# Patient Record
Sex: Male | Born: 1960 | Race: White | Hispanic: No
Health system: Midwestern US, Community
[De-identification: ages and names within clinical notes are randomized; demographics above are authoritative.]

## PROBLEM LIST (undated history)

## (undated) DIAGNOSIS — G43909 Migraine, unspecified, not intractable, without status migrainosus: Secondary | ICD-10-CM

## (undated) DIAGNOSIS — J189 Pneumonia, unspecified organism: Secondary | ICD-10-CM

## (undated) DIAGNOSIS — B0229 Other postherpetic nervous system involvement: Secondary | ICD-10-CM

## (undated) DIAGNOSIS — R42 Dizziness and giddiness: Secondary | ICD-10-CM

## (undated) DIAGNOSIS — I639 Cerebral infarction, unspecified: Secondary | ICD-10-CM

## (undated) DIAGNOSIS — K259 Gastric ulcer, unspecified as acute or chronic, without hemorrhage or perforation: Secondary | ICD-10-CM

## (undated) DIAGNOSIS — K219 Gastro-esophageal reflux disease without esophagitis: Secondary | ICD-10-CM

## (undated) DIAGNOSIS — F329 Major depressive disorder, single episode, unspecified: Secondary | ICD-10-CM

## (undated) DIAGNOSIS — F32A Depression, unspecified: Secondary | ICD-10-CM

## (undated) DIAGNOSIS — Z87442 Personal history of urinary calculi: Secondary | ICD-10-CM

## (undated) DIAGNOSIS — E039 Hypothyroidism, unspecified: Secondary | ICD-10-CM

## (undated) DIAGNOSIS — E538 Deficiency of other specified B group vitamins: Secondary | ICD-10-CM

## (undated) DIAGNOSIS — M1990 Unspecified osteoarthritis, unspecified site: Secondary | ICD-10-CM

## (undated) DIAGNOSIS — E7800 Pure hypercholesterolemia, unspecified: Secondary | ICD-10-CM

## (undated) HISTORY — PX: TONSILECTOMY/ADENOIDECTOMY WITH MYRINGOTOMY: SHX6125

## (undated) HISTORY — DX: Gastric ulcer, unspecified as acute or chronic, without hemorrhage or perforation: K25.9

## (undated) HISTORY — PX: COLON SURGERY: SHX602

## (undated) HISTORY — DX: Unspecified osteoarthritis, unspecified site: M19.90

## (undated) HISTORY — PX: BACK SURGERY: SHX140

## (undated) HISTORY — PX: TONSILLECTOMY: SUR1361

## (undated) HISTORY — PX: PROSTATE SURGERY: SHX751

## (undated) HISTORY — DX: Migraine, unspecified, not intractable, without status migrainosus: G43.909

## (undated) HISTORY — PX: KNEE SURGERY: SHX244

## (undated) HISTORY — DX: Cerebral infarction, unspecified: I63.9

## (undated) HISTORY — PX: HERNIA REPAIR: SHX51

## (undated) HISTORY — DX: Deficiency of other specified B group vitamins: E53.8

## (undated) HISTORY — DX: Pure hypercholesterolemia, unspecified: E78.00

## (undated) HISTORY — PX: SPINE SURGERY: SHX786

## (undated) HISTORY — DX: Other postherpetic nervous system involvement: B02.29

## (undated) HISTORY — DX: Dizziness and giddiness: R42

## (undated) HISTORY — DX: Gastro-esophageal reflux disease without esophagitis: K21.9

## (undated) HISTORY — PX: EYE SURGERY: SHX253

---
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
## 2000-09-27 HISTORY — PX: LUNG SURGERY: SHX703

## 2003-09-28 HISTORY — PX: CHOLECYSTECTOMY: SHX55

## 2004-03-27 HISTORY — PX: GASTRIC BYPASS: SHX52

## 2005-09-27 HISTORY — PX: OTHER SURGICAL HISTORY: SHX169

## 2008-09-27 HISTORY — PX: HEMORRHOID SURGERY: SHX153

## 2015-10-16 NOTE — Nursing Note (Signed)
Nursing Discharge Summary - Text       Nursing Discharge Summary Entered On:  10/16/2015 16:23 EST    Performed On:  10/16/2015 16:23 EST by Barrett, RN, Tresa Endo               DC Information   Discharge To, Anticipated :   Home with family support   Mode of Discharge :   Ambulatory   Transportation :   Private vehicle   Accompanied By :   Marlane Hatcher, RN, Tresa Endo - 10/16/2015 16:23 EST

## 2016-05-12 NOTE — Nursing Note (Signed)
Nursing Discharge Summary - Text       Nursing Discharge Summary Entered On:  05/12/2016 15:38 EDT    Performed On:  05/12/2016 15:36 EDT by Wynonia HazardSARBANIS, RN, Maceo ProLAUREN B               DC Information   Discharge To, Anticipated :   Home with family support   Mode of Discharge :   Ambulatory   Transportation :   Private vehicle   Accompanied By :   Darlina GuysSpouse   SARBANIS, RN, Maceo ProLAUREN B - 05/12/2016 15:36 EDT   Education   Responsible Learner(s) :   No Data Available     Barriers To Learning :   None evident   Teaching Method :   Explanation, Printed materials   Wynonia HazardSARBANIS, RMaceo Pro, LAUREN B - 05/12/2016 15:36 EDT   Post-Hospital Education Adult Grid   Importance of Follow-Up Visits :   Verbalizes understanding   When to [facility] Health Care Provider :   [facility]   Wynonia HazardSARBANIS, RN, Maceo ProLAUREN B - 05/12/2016 15:36 EDT   Medication Education Adult Grid   Med Dosage, Route, Scheduling :   IT sales professionalVerbalizes understanding   Safety, Medication :   Verbalizes understanding   Wynonia HazardSARBANIS, RN, Leotis ShamesLAUREN B - 05/12/2016 15:36 EDT

## 2016-05-29 NOTE — Nursing Note (Signed)
Basic Admission Information - Text       Basic Admission Information Adult Entered On:  05/29/2016 17:52 EDT    Performed On:  05/29/2016 17:52 EDT by Gemma PayorBarr,  Melissa               Height/Weight   Height/Length Measured :   168 cm(Converted to: 5 ft 6 in, 5.51 ft, 66.14 in)    Ideal Body Weight Calculated :   64.126 kg   Gemma PayorBarr,  Melissa - 05/29/2016 17:52 EDT   Safety   Environmental Safety in Place :   Adequate room lighting, Bed exit alarm, Bed in low position, Call device within reach, Fall risk magnet on door   Demonstrates Ability to Use Call Light :   Yes   Patient Identified :   Identification band   Gemma PayorBarr,  Melissa - 05/29/2016 17:52 EDT

## 2016-05-29 NOTE — Transfer Summaries (Signed)
Intrahospital Transfer - Text       Intrahospital Transfer Entered On:  05/29/2016 17:01 EDT    Performed On:  05/29/2016 16:45 EDT by Marrion Coy, RN, LARA J               Transfer   Transfer Mode :   Stretcher   Patient's Transfer Condition :   Stable   Accompanied By Staff :   Transporter   Marrion Coy, RN, LARA J - 05/29/2016 17:01 EDT

## 2016-05-29 NOTE — Nursing Note (Signed)
Adult Patient History Form-Text       Adult Patient History Entered On:  05/29/2016 17:51 EDT    Performed On:  05/29/2016 17:41 EDT by Gemma Payor               General Info   Preferred Name :   Jimmy Perkins   Admitted From :   ED   Mode of Arrival on Unit :   Stretcher   In Clinical Trial With Signed Consent for Related Condition :   No signed consent for clinical trial   Information Given By :   Self   Pregnancy Status :   N/A   Has the patient received chemotherapy or biotherapy within the last 48 hours? :   No   Is the patient currently (2-3 days) receiving radiation treatment? :   No   Gemma Payor - 05/29/2016 17:41 EDT   Problem History   (As Of: 05/29/2016 17:51:29 EDT)   Problems(Active)    Back pain (SNOMED CT  :[id number] )  Name of Problem:   Back pain ; Recorder:   PORTER, RN, EVA G; Confirmation:   Confirmed ; Classification:   Medical ; Code:   [id number] ; Contributor System:   PowerChart ; Last Updated:   10/16/2015 12:11 EST ; Life Cycle Date:   10/16/2015 ; Life Cycle Status:   Active ; Vocabulary:   SNOMED CT        DISH (diffuse idiopathic skeletal hyperostosis) (IMO  :409811 )  Name of Problem:   DISH (diffuse idiopathic skeletal hyperostosis) ; Recorder:   PORTER, RN, EVA G; Confirmation:   Confirmed ; Classification:   Medical ; Code:   [phone] ; Contributor System:   Dietitian ; Last Updated:   10/16/2015 12:12 EST ; Life Cycle Date:   10/16/2015 ; Life Cycle Status:   Active ; Vocabulary:   IMO        Kidney stones (IMO  :95621 )  Name of Problem:   Kidney stones ; Recorder:   PORTER, Charity fundraiser, EVA G; Confirmation:   Confirmed ; Classification:   Medical ; Code:   [phone] ; Contributor System:   Dietitian ; Last Updated:   10/16/2015 12:11 EST ; Life Cycle Date:   10/16/2015 ; Life Cycle Status:   Active ; Vocabulary:   IMO        Migraine (IMO  :78469 )  Name of Problem:   Migraine ; Recorder:   PORTER, RN, EVA G; Confirmation:   Confirmed ; Classification:   Medical ; Code:   [id number] ;  Contributor System:   Dietitian ; Last Updated:   10/16/2015 12:11 EST ; Life Cycle Date:   10/16/2015 ; Life Cycle Status:   Active ; Vocabulary:   IMO        Nerve damage (SNOMED CT  :[id number] )  Name of Problem:   Nerve damage ; Recorder:   Marliss Czar, RN, Bridgette Habermann; Confirmation:   Confirmed ; Classification:   Medical ; Code:   [id number] ; Contributor System:   Dietitian ; Last Updated:   05/12/2016 11:02 EDT ; Life Cycle Date:   05/12/2016 ; Life Cycle Status:   Active ; Vocabulary:   SNOMED CT        Stimulator, device (SNOMED CT  :[id number] )  Name of Problem:   Stimulator, device ; Recorder:   Gemma Payor; Confirmation:   Confirmed ; Classification:   Medical ; Code:   [phone] ; Contributor System:  PowerChart ; Last Updated:   05/29/2016 17:51 EDT ; Life Cycle Date:   05/29/2016 ; Life Cycle Status:   Active ; Vocabulary:   SNOMED CT          Diagnoses(Active)    Ataxic gait  Date:   05/29/2016 ; Diagnosis Type:   Discharge ; Confirmation:   Confirmed ; Clinical Dx:   Ataxic gait ; Classification:   Medical ; Clinical Service:   Non-Specified ; Code:   ICD-10-CM ; Probability:   0 ; Diagnosis Code:   R26.0      Dizziness  Date:   05/29/2016 ; Diagnosis Type:   Reason For Visit ; Confirmation:   Complaint of ; Clinical Dx:   Dizziness ; Classification:   Medical ; Clinical Service:   Emergency medicine ; Code:   PNED ; Probability:   0 ; Diagnosis Code:   [id number]      Dizziness  Date:   05/29/2016 ; Diagnosis Type:   Discharge ; Confirmation:   Confirmed ; Clinical Dx:   Dizziness ; Classification:   Medical ; Clinical Service:   Non-Specified ; Code:   ICD-10-CM ; Probability:   0 ; Diagnosis Code:   R42      Hypertension  Date:   05/29/2016 ; Diagnosis Type:   Discharge ; Confirmation:   Confirmed ; Clinical Dx:   Hypertension ; Classification:   Medical ; Clinical Service:   Non-Specified ; Code:   ICD-10-CM ; Probability:   0 ;  Diagnosis Code:   I10        Family History   Family History   (As Of: 05/29/2016 17:51:29 EDT)   Father:   Relation:   Father ; Gender:   Male ;           Nomenclature:   Diabetes mellitus ; Value:   Positive            Nomenclature:   Stroke ; Value:   Positive            Nomenclature:   Parkinson disease ; Value:   Positive          Mother:   Relation:   Mother ; Gender:   Male ;           Nomenclature:   Breast cancer ; Value:   Positive            Allergy   (As Of: 05/29/2016 17:51:29 EDT)   Allergies (Active)   OxyCONTIN  Estimated Onset Date:   Unspecified ; Created ByHale Bogus, RN, EVA G; Reaction Status:   Active ; Category:   Drug ; Substance:   OxyCONTIN ; Type:   Allergy ; Severity:   Mild ; Updated By:   Hale Bogus RN, EVA G; Reviewed Date:   05/29/2016 14:30 EDT      Reglan  Estimated Onset Date:   Unspecified ; Created By:   Hale Bogus, RN, EVA G; Reaction Status:   Active ; Category:   Drug ; Substance:   Reglan ; Type:   Allergy ; Severity:   Mild ; Updated By:   Hale Bogus RN, EVA G; Reviewed Date:   05/29/2016 14:30 EDT        Immunizations   Last Tetanus :   Unknown   Influenza Vaccine Status :   Non-influenza season (before Oct 1st and after Mar 31st)   Pneumococcal Vaccine Status :   Unknown   Gemma Payor - 05/29/2016 17:41 EDT  ID Risk Screen   Patient Recent Travel History :   No recent travel   Family Member Travel History :   No recent travel   Gemma Payor - 05/29/2016 17:41 EDT   Chills :   No   Cough (Any Duration) :   No   Fever :   No   Hemoptysis (Blood in Sputum) :   No   Night Sweats :   No   Weight Loss Greater Than 10 Pounds :   No   Hx of TB Now or at Any Time In the Past (Even if on Meds) :   No   Foreign-Born :   No   Homeless or In Shelter :   No   Incarcerated Within Last 2 Years :   No   Intravenous Drug User :   No   Male Homosexual :   No   New TST/IGRA Results Pos(Within 2 yrs),Hx Recent TB Exposure :   No   Gemma Payor - 05/29/2016 17:41 EDT   C. diff Screen   Have you had 3 or more  loose/watery stool in 24 hours? :   No   Gemma Payor - 05/29/2016 17:41 EDT   Procedure History        -    Procedure History   (As Of: 05/29/2016 17:51:29 EDT)     Anesthesia Minutes:   0 ; Procedure Name:   Stimulator ; Procedure Minutes:   0 ; Comments:     10/16/2015 12:13 - PORTER, RN, EVA G  back            Anesthesia Minutes:   0 ; Procedure Name:   Lung adhesion ; Procedure Minutes:   0 ; Comments:     10/16/2015 12:14 - PORTER, RN, EVA G  decortication of left lower lung            Anesthesia Minutes:   0 ; Procedure Name:   Cholecystectomy ; Procedure Minutes:   0            Anesthesia Minutes:   0 ; Procedure Name:   Colon ; Procedure Minutes:   0 ; Comments:     10/16/2015 12:13 - PORTER, RN, EVA G  part of colon removed            Anesthesia Minutes:   0 ; Procedure Name:   Back ; Procedure Minutes:   0 ; Comments:     10/16/2015 12:12 - PORTER, RN, EVA G  x2            Anesthesia Minutes:   0 ; Procedure Name:   Gastric ; Procedure Minutes:   0 ; Comments:     10/16/2015 12:12 - PORTER, RN, EVA G  bypass            Anesthesia/Sedation   Anesthesia History :   Prior general anesthesia   Anesthesia Reaction :   None   Moderate Sedation History :   Prior sedation for procedure   Previous Problem With Sedation :   None   Gemma Payor - 05/29/2016 17:41 EDT   Transfusion/Bloodless Med   Transfusion History :   No prior transfusion   Will Patient Accept Blood Transfusion and/or Blood Products :   Yes   Gemma Payor - 05/29/2016 17:41 EDT   Nutrition   Home Diet :   Regular   Appetite :   Fair   Feeding Ability :  Complete independence   Home Liquid Viscosity :   Thin   Usual Weight :   88.6 kg(Converted to: 195 lb 5 oz, 195.33 lb, 3,125.27 oz)    Unintentional Weight Change Greater Than 10 lbs in the Last 6 Months :   No   Gemma Payor - 05/29/2016 17:41 EDT   Functional   Cognitive Function Concerns Prior to Admission :   None reported   Fear of Falling :   No   Ability to Ambulate Prior to Admission :    Independent   Ability to Move in Bed Prior to Admission :   Independent   ADLs :   Minimal assist   Current Level of Assistance for Self-Care/Mobility :   No change from baseline   Usual Hours of Sleep :   8 hr   Gemma Payor - 05/29/2016 17:41 EDT   Functional Assessment   Bathing :   Independent (2)   Dressing :   Requires assistance (1)   Toileting :   Requires assistance (1)   Transferring Bed or Chair :   Requires assistance (1)   Continence :   Independent (2)   Feeding :   Independent (2)   ADL Index Score :   9    Gemma Payor - 05/29/2016 17:41 EDT   Living and Resources   Living Situation :   Home with family support   Gemma Payor - 05/29/2016 17:41 EDT   Social History   Social History   (As Of: 05/29/2016 17:51:29 EDT)   Tobacco:        Former smoker   (Last Updated: 05/29/2016 14:11:48 EDT by Delora Fuel, RN, STEPHEN)          Alcohol:        Denies   (Last Updated: 10/16/2015 12:14:41 EST by Hale Bogus, RN, EVA G)          Substance Abuse:        Denies   (Last Updated: 10/16/2015 12:14:43 EST by Hale Bogus, RN, EVA G)            Advance Directive   *Advance Directive :   No   Gemma Payor - 05/29/2016 17:41 EDT   Educ Needs   Patient/Family Learning Style Preferences   Patient :   Verbal explanation   Family :   Verbal explanation   Gemma Payor - 05/29/2016 17:41 EDT   Barriers to Learning :   None evident   Preventative Measures Information Given :   Special educational needs teacher, Environmental Safety, Pain Scale   Gemma Payor - 05/29/2016 17:41 EDT   DC Needs   Discharge To :   Home with family support   Gemma Payor - 05/29/2016 17:41 EDT

## 2016-05-29 NOTE — Nursing Note (Signed)
Orthostatics - Text       Orthostatics Entered On:  05/29/2016 14:04 EDT    Performed On:  05/29/2016 14:03 EDT by Delora Fuel, RN, STEPHEN               Orthostatics   Systolic/Diastolic  Supine BP :   94 mmHg   Systolic/Diastolic  Supine BP :   69 mmHg   Pulse Supine :   80 bpm   Systolic/Diastolic  Standing BP :   98 mmHg   Systolic/Diastolic  Standing BP :   55 mmHg (LOW)    Pulse Standing :   91 bpm   LOWDERMILK, RN, STEPHEN - 05/29/2016 14:03 EDT

## 2016-05-30 NOTE — Discharge Summary (Signed)
Inpatient Clinical Summary             [facility]  Post-Acute Care Transfer Instructions  PERSON INFORMATION   Name: Jimmy Perkins, Jimmy Perkins  MRN: [id number]    FIN#: [id number]   PHYSICIANS  Admitting Physician: Camillia Herter  Attending Physician: Camillia Herter   PCP: Pcp, None  Discharge Diagnosis:  Ataxic gait; Dizziness; Hypertension  Comment:       PATIENT EDUCATION INFORMATION  Instructions:             Effects of a Stroke on the Brain and Body  Medication Leaflets:               Follow-up:                          With: Address: When:   [facility]     [phone]    Comments:   Call for followup appointment in 1-2 weeks for outpatient cardiac monitoring       With: Address: When:   Chip Boer [address]  [place], [place]  [address]  [phone]) [phone]    Comments:   Call for followup appointment in 1 week                                 Type Location Start [facility]   [facility] 06/23/2016 11:15:00 06/23/2016 12:00:00 Confirmed   SL Home Sleep Test [facility] 07/01/2016 19:30:00 07/01/2016 20:30:00 Confirmed          MEDICATION LIST  Medication Reconciliation at Discharge:         New Medications  Printed Prescriptions  atorvastatin (atorvastatin 40 mg oral tablet) 2 Tabs Oral (given by mouth) Once a Day (at bedtime). Refills: 0.  Last Dose:____________________  Medications That Were Updated - Follow Current Instructions  Other Medications  Current aspirin (aspirin 81 mg oral tablet, chewable) 4 Tabs Chewed every day.  Last Dose:____________________    Current cyanocobalamin (cyanocobalamin 1000 mcg/mL injectable solution)   Last Dose:____________________    Medications that have not changed  Other Medications  albuterol (albuterol CFC free 90 mcg/inh inhalation aerosol) 2 Puffs Inhale (breathe in) every 4 hours as needed shortness of breath or wheezing. Refills: 0.  Last Dose:____________________  baclofen (baclofen 10 mg oral  tablet)   Last Dose:____________________  cycloSPORINE ophthalmic (Restasis 0.05% ophthalmic emulsion)   Last Dose:____________________  fluticasone nasal (fluticasone 50 mcg/inh nasal spray) Nasal (into the nose) every day.  Last Dose:____________________  montelukast (Singulair 4 mg oral granule) Oral (given by mouth) every day.  Last Dose:____________________  multivitamin with minerals (Celebrate Multivitamin) every day.  Last Dose:____________________  ondansetron (ondansetron 8 mg oral tablet)   Last Dose:____________________  promethazine (promethazine 25 mg oral tablet)   Last Dose:____________________  sertraline (sertraline 100 mg oral tablet)   Last Dose:____________________  solifenacin (VESIcare 5 mg oral tablet)   Last Dose:____________________  tapentadol (Nucynta 75 mg oral tablet)   Last Dose:____________________  traZODone (traZODone 50 mg oral tablet)   Last Dose:____________________  zolpidem (zolpidem 10 mg oral tablet) 1 Tabs Oral (given by mouth) Once a Day (at bedtime) as needed., " THIS MEDICATION IS ASSOCIATED WITH AN INCREASED RISK OF FALLS."  Last Dose:____________________  No Longer Take the Following Medications  SUMAtriptan (SUMAtriptan 100 mg oral tablet)   Stop Taking Reason:  Physician Request         Patient's Final Home Medication List Upon Discharge:          albuterol (albuterol CFC free 90 mcg/inh inhalation aerosol) 2 Puffs Inhale (breathe in) every 4 hours as needed shortness of breath or wheezing. Refills: 0.  aspirin (aspirin 81 mg oral tablet, chewable) 4 Tabs Chewed every day.  atorvastatin (atorvastatin 40 mg oral tablet) 2 Tabs Oral (given by mouth) Once a Day (at bedtime). Refills: 0.  baclofen (baclofen 10 mg oral tablet)   cyanocobalamin (cyanocobalamin 1000 mcg/mL injectable solution)   cycloSPORINE ophthalmic (Restasis 0.05% ophthalmic emulsion)   fluticasone nasal (fluticasone 50 mcg/inh nasal spray) Nasal (into the nose) every day.  montelukast (Singulair 4 mg  oral granule) Oral (given by mouth) every day.  multivitamin with minerals (Celebrate Multivitamin) every day.  ondansetron (ondansetron 8 mg oral tablet)   promethazine (promethazine 25 mg oral tablet)   sertraline (sertraline 100 mg oral tablet)   solifenacin (VESIcare 5 mg oral tablet)   tapentadol (Nucynta 75 mg oral tablet)   traZODone (traZODone 50 mg oral tablet)   zolpidem (zolpidem 10 mg oral tablet) 1 Tabs Oral (given by mouth) Once a Day (at bedtime) as needed., " THIS MEDICATION IS ASSOCIATED WITH AN INCREASED RISK OF FALLS."         Comment:       ORDERS         Order Name Order Details   Discharge Patient 05/30/16 16:02:00 EDT

## 2016-05-30 NOTE — Discharge Summary (Signed)
Discharge Summary    [facility]  Sorin Florea, MD  Admission Date: 05/29/2016  Discharged Date: 05/30/2016    PRIMARY CARE PHYSICIAN:  Worthy KeelerAlison MacLeod with [facility]     NEUROLOGY CONSULTATION:  Lilli FewLeonardo Morantes-Gomez, MD    DISCHARGE DISPOSITION:  Home.    CONDITION:  Fair.    ACTIVITY:  As tolerated.    DIET:  Cardiac.    DISCHARGE CONDITION:  Fair.    DISCHARGE DIAGNOSES:  1.  Vertigo, ataxia, right-sided motor deficit with possible  underlying ischemic cerebrovascular accident.  2.  Chronic low back pain with spinal nerve stimulator.  3.  Orthostatic hypotension, resolved.    DISCHARGE MEDICATIONS:  New  1.  Aspirin 325 mg p.o. daily.  2.  Atorvastatin 80 mg p.o. at bedtime.    MEDICATIONS:  Continue previous home   1.  Albuterol inhaler 2 puffs q.4 hours p.r.n. for shortness of  breath.  2.  Baclofen 10 mg p.o. daily.  3.  Vitamin B12 1000 mcg IM monthly.  4.  Restasis 0.05%, resume previous home dose.  5.  Flonase 1 puff per nostril daily.  6.  Singulair 10 mg p.o. daily.  7.  Multivitamin 1 p.o. daily.  8.  Zofran 8 mg p.o. q 8 hours p.r.n. for nausea.  9.  Phenergan 25 mg p.o. every 6 hours p.r.n. for nausea.  10.  Zoloft resume previous home dose.  11.  VESIcare 5 mg p.o. daily.  12.  Nucynta 75 mg p.o. daily.  13.  Trazodone 50 mg p.o. at bedtime.  14.  Ambien 10 mg p.o. at bedtime.    HOSPITAL COURSE:  Please review history and physical and consultation  [facility].    The patient is a 55 year old gentleman without history of diabetes,  hypertension or hypercholesterolemia who presented to the hospital  yesterday with complaints of dizziness.  He has history of chronic  back pain and has spinal nerve stimulator.  This was replaced about 3  years ago.  He developed vertigo with dizziness and his gait deviating  towards the right approximately 24 hours prior to admission.  The  symptoms started on the morning prior to admission when he got out of  bed and  lasted about 10 minutes initially.  Later on the symptoms  became persistent and never got away.  He went to urgent care  initially and then was instructed to go to the Emergency Room.  In the  Emergency Room, he was initially noted to be hypotensive with a blood  pressure of 88/67.  He was orthostatic with diastolic drop from 14  point from lying to standing.  He was given IV fluids and his blood  pressure recovered.  He had no speech problems and no vision or  hearing changes.  On physical examination, he had a right facial droop  and he also had weakness on the right upper and lower extremities.   His coordination on the right upper extremity was decreased as well.   He had a positive Romberg test with an unsteady gait.  His sensation  was normal and reflexes were also intact.    The patient was evaluated with a CT of the head without contrast that  showed no acute intracranial abnormalities, no hemorrhage, no mass  effect.  No extraaxial fluid collection.  There was no abnormal  parenchymal density and the ventricles were normal and the basilar  cisterns are patent.  There were  no signs of skull fracture.  The  sinuses appeared clear.  This was followed by a CT angiography of the  head and neck that did not show any signs of occlusion or stroke.    Clinically, the patient progressed well during his hospitalization.   His gait returned to normal.  He continues to have mild right upper  extremity weakness, although this is improved.  His coordination  returned to normal.  He still has a mild right facial droop but that  is improved also.  His speech remained clear.  He has no sensory  deficit.  He was seen in consultation by Dr. Reece PackerMorantes-Gomez from  neurology.  At this time, the patient's symptoms are strongly  suggestive of a resolved stroke.  The patient does not have a history  of hypertension, hyperlipidemia or diabetes which makes a small vessel  ischemic stroke unlikely.  The CT angiogram does not show any  signs of  a moderate clot/total occlusion and the patient's symptoms are not  typical for this.  This leaves the possibility of embolic stroke.   Unfortunately, the patient has an implanted baclofen stimulator an MRI  could not be done.  Dr. Reece PackerMorantes-Gomez recommended secondary stroke  prevention with aspirin and statin.    The patient was monitored on telemetry and no significant arrhythmias  were noted.  The patient reports that approximately 2 weeks ago, he  had some chest discomfort that woke him up at night.  He states that  lately he appears to fatigue more easily.  He may have paroxysmal  atrial fibrillation.  Again, no arrhythmias noted on the monitor  during this time.  The patient would benefit from outpatient  evaluation by cardiology with event monitor.  Echocardiogram was  performed and it showed normal left ventricular size and systolic  function.  There was normal right ventricular size and systolic  function.  There were no significant valvular abnormalities.  The  patient is recommended to follow up with his cardiologist as an  outpatient for a prolonged monitoring for detection of possible  underlying atrial fibrillation.    At this time, the patient appears clinically stable.  He was able to  get out of bed and ambulate without assistance.  His deficits are  improving significantly.  The vertigo, dizziness resolved.  He appears  clinically stable for discharge.  VITAL SIGNS:  His temperature is  37.2, heart rate 67, respirations 16, blood pressure 124/79, no  orthostatic changes of his blood pressure.  He is alert and oriented  times 3 in no distress.  LUNGS:  Clear to auscultation bilaterally.   HEART:  Regular rate, no murmur.  ABDOMEN:  Soft, nontender.   NEUROLOGIC:  Cranial nerves evaluation reveals a right facial droop,  otherwise the rest of the examination is within normal limits.   Sensory examination is normal throughout the body.  Motor examination  reveals mild decreased strength and  -5/5 on the right upper extremity.   Otherwise, his strength is normal in the rest of his extremities.   Finger-to-nose test is normal bilaterally.    FOLLOWUP:  With primary care physician Worthy KeelerAlison MacLeod in 1 week.  The  patient was recommended to follow up with the cardiologist as soon as  possible for further monitoring.      Sorin  Michael LitterFlorea, MD  TR: *n DD: 05/30/2016 17:10 TD: 05/30/2016 21:37 Job#: [id number]  \[id number]#: [id number]  \[id number]\      cc:  Lilli FewLeonardo Morantes-Gomez MD  Signature Line    Electronically Signed on 05/31/2016 03:09 PM EDT  ________________________________________________  Aldine Contes

## 2016-05-30 NOTE — Nursing Note (Signed)
Nursing Discharge Summary - Text       Physician Discharge Summary Entered On:  05/30/2016 16:02 EDT    Performed On:  05/30/2016 16:01 EDT by Aldine ContesFLOREA-MD,  SORIN               DC Information   Provider Instructions for Diet :   Cardiac diet   Provider Instructions for Activity :   Continue your regular activity   Aldine ContesFLOREA-MD,  SORIN - 05/30/2016 16:01 EDT

## 2016-05-30 NOTE — Discharge Summary (Signed)
Inpatient Patient Summary               [facility]  [address]  [place], [place] [address]  [phone]  Patient Discharge Instructions     Name: Jimmy Perkins, Jimmy Perkins  Current Date: 05/30/2016 16:36:17  DOB: [birth year] MRN: [id number] FIN: NBR%[phone]  Patient Address: [address] [place] [address]  Patient Phone: [phone]  Primary Care Provider:  Name: Pcp, None  Phone:    Immunizations Provided:       Discharge Diagnosis: Ataxic gait; Dizziness; Hypertension  Discharged To: TO, ANTICIPATED%>Home with family support  Home Treatments: TREATMENTS, ANTICIPATED%>  Devices/Equipment: EQUIPMENT REHAB%>Cane  Post Hospital Services: HOSPITAL SERVICES%>  Professional Skilled Services: SKILLED SERVICES%>  Therapist, sports and Community Resources:               SERV AND COMM RES, ANTICIPATED%>  Mode of Discharge Transportation: TRANSPORTATION%>  Discharge Orders         Discharge Patient 05/30/16 16:02:00 EDT         Comment:      Medications   During the course of your visit, your medication list was updated with the most current information. The details of those changes are reflected below:         New Medications  Printed Prescriptions  atorvastatin (atorvastatin 40 mg oral tablet) 2 Tabs Oral (given by mouth) Once a Day (at bedtime). Refills: 0.  Last Dose:____________________  Medications That Were Updated - Follow Current Instructions  Other Medications  Current aspirin (aspirin 81 mg oral tablet, chewable) 4 Tabs Chewed every day.  Last Dose:____________________    Current cyanocobalamin (cyanocobalamin 1000 mcg/mL injectable solution)   Last Dose:____________________    Medications that have not changed  Other Medications  albuterol (albuterol CFC free 90 mcg/inh inhalation aerosol) 2 Puffs Inhale (breathe in) every 4 hours as needed shortness of breath or wheezing. Refills: 0.  Last Dose:____________________  baclofen (baclofen 10 mg oral tablet)   Last  Dose:____________________  cycloSPORINE ophthalmic (Restasis 0.05% ophthalmic emulsion)   Last Dose:____________________  fluticasone nasal (fluticasone 50 mcg/inh nasal spray) Nasal (into the nose) every day.  Last Dose:____________________  montelukast (Singulair 4 mg oral granule) Oral (given by mouth) every day.  Last Dose:____________________  multivitamin with minerals (Celebrate Multivitamin) every day.  Last Dose:____________________  ondansetron (ondansetron 8 mg oral tablet)   Last Dose:____________________  promethazine (promethazine 25 mg oral tablet)   Last Dose:____________________  sertraline (sertraline 100 mg oral tablet)   Last Dose:____________________  solifenacin (VESIcare 5 mg oral tablet)   Last Dose:____________________  tapentadol (Nucynta 75 mg oral tablet)   Last Dose:____________________  traZODone (traZODone 50 mg oral tablet)   Last Dose:____________________  zolpidem (zolpidem 10 mg oral tablet) 1 Tabs Oral (given by mouth) Once a Day (at bedtime) as needed., " THIS MEDICATION IS ASSOCIATED WITH AN INCREASED RISK OF FALLS."  Last Dose:____________________  No Longer Take the Following Medications  SUMAtriptan (SUMAtriptan 100 mg oral tablet)   Stop Taking Reason: Physician Request         [facility] would like to thank you for allowing [place] to assist you with your healthcare needs. The following includes patient education materials and information regarding your injury/illness.     Jimmy, Hinton Perkins has been given the following list of follow-up instructions, prescriptions, and patient education materials:  Follow-up Instructions:             With: Address: When:   [facility]  [phone]    Comments:   Call for followup appointment in 1-2 weeks for outpatient cardiac monitoring       With: Address: When:   Chip BoerMACLEOD-FNP, ALLISON E [address]  [place], [place]  [id number]  [phone]    Comments:   Call for followup appointment in 1 week                     [facility]   [facility] 06/23/2016 11:15:00 06/23/2016 12:00:00 Confirmed   [facility] 07/01/2016 19:30:00 07/01/2016 20:30:00 Confirmed              It is important to always keep an active list of medications available so that you can share with other providers and manage your medications appropriately. As an additional courtesy, we are also providing you with your final active medications list that you can keep with you.           albuterol (albuterol CFC free 90 mcg/inh inhalation aerosol) 2 Puffs Inhale (breathe in) every 4 hours as needed shortness of breath or wheezing. Refills: 0.  aspirin (aspirin 81 mg oral tablet, chewable) 4 Tabs Chewed every day.  atorvastatin (atorvastatin 40 mg oral tablet) 2 Tabs Oral (given by mouth) Once a Day (at bedtime). Refills: 0.  baclofen (baclofen 10 mg oral tablet)   cyanocobalamin (cyanocobalamin 1000 mcg/mL injectable solution)   cycloSPORINE ophthalmic (Restasis 0.05% ophthalmic emulsion)   fluticasone nasal (fluticasone 50 mcg/inh nasal spray) Nasal (into the nose) every day.  montelukast (Singulair 4 mg oral granule) Oral (given by mouth) every day.  multivitamin with minerals (Celebrate Multivitamin) every day.  ondansetron (ondansetron 8 mg oral tablet)   promethazine (promethazine 25 mg oral tablet)   sertraline (sertraline 100 mg oral tablet)   solifenacin (VESIcare 5 mg oral tablet)   tapentadol (Nucynta 75 mg oral tablet)   traZODone (traZODone 50 mg oral tablet)   zolpidem (zolpidem 10 mg oral tablet) 1 Tabs Oral (given by mouth) Once a Day (at bedtime) as needed., " THIS MEDICATION IS ASSOCIATED WITH AN INCREASED RISK OF FALLS."      Take only the medications listed above. Contact your doctor prior to taking any medications not on this list.        Discharge instructions, if any, will display below     Instructions for Diet: INSTRUCTIONS FOR DIET%>Cardiac diet   Instructions for Supplements: SUPPLEMENT  INSTRUCTIONS%>   Instructions for Activity: INSTRUCTIONS FOR ACTIVITY%>Continue your regular activity   Instructions for Wound Care: INSTRUCTIONS FOR WOUND CARE%>     Medication leaflets, if any, will display below     Patient education materials, if any, will display below        Effects of a Stroke on the Brain and Body   When blood supply is cut off from the brain, cells begin to die from lack of oxygen. Within minutes, skills such as reasoning, speech, and some degree of arm, leg, or facial movement may be lost. The type of skills and the amount of loss depend on which part of the brain was affected, and how much tissue was damaged.      1.   The brain is the bodys control center, which handles communication, motor, sensory, and processing functions.   2.   The cerebellum controls the bodys coordination and balance.   3.   The brain stem links the brain and  the spinal cord. It also handles basic body functions.   4.   The spinal cord carries messages between the brain and the body.   A stroke causes lost skills   Each part of the brain has a role related to a function in the body. Damage to any part of the brain limits its ability to carry out its role. This results in lost skills or changes in function, and even in personality. Some parts of the brain and its correlating functions that may be affected by stroke are as follows:    The front of the brain houses reasoning and the ability to control emotions. Personality resides here.    The left side of the brain controls the right side of the body. It also handles speech, language, reading, and writing.    The right side of the brain controls the left side of the body.    The back of the brain controls vision.    The brain stem handles breathing and swallowing.      2000-2017 [employer] [address], [place], [place] [id number]. All rights reserved. This information is not intended as a substitute for professional medical care. Always follow  your healthcare professional's instructions.            IS IT A STROKE? Act FAST and Check for these signs:    FACE                         Does the face look uneven?    ARM                         Does one arm drift down?    SPEECH                    Does their speech sound strange?    TIME                         Call 9-1-1 at any sign of stroke  ---------------------------------------------------------------------------------------------------------------------------  Heart Attack Signs  Chest discomfort: Most heart attacks involve discomfort in the center of the chest and lasts more than a few minutes, or goes away and comes back. It can feel like uncomfortable pressure, squeezing, fullness or pain.  Discomfort in upper body: Symptoms can include pain or discomfort in one or both arms, back, neck, jaw or stomach.  Shortness of breath: With or without discomfort.  Other signs: Breaking out in a cold sweat, nausea, or lightheaded.  Remember, MINUTES DO MATTER. If you experience any of these heart attack warning signs, call 9-1-1 to get immediate medical attention!     ---------------------------------------------------------------------------------------------------------------------------  [facility] allows you to manage your health, view your test results, and retrieve your discharge documents from your hospital stay securely and conveniently from your computer.  To begin the enrollment process, visit [email or web address]. Click on "Sign up now" under [facility].   Yes - Patient/Family/Caregiver demonstrates understanding of instructions given        ______________________________                                 ___________________    Patient/Family/ Caregiver Signature  Date/Time     ______________________________                                 ___________________    Provider Signature                                                                                          Date/Time

## 2016-05-30 NOTE — Case Communication (Signed)
CM Discharge Planning Assessment - Text       CM Discharge Planning Assessment Entered On:  05/30/2016 12:57 EDT    Performed On:  05/30/2016 12:54 EDT by Gregary CromerLee,  Jennifer M               Home Environment   Living Environment :   No Living Environment Information Available     Affect/Behavior :   Appropriate   Lives With :   Child(ren), Spouse   Lives In :   Single level home   Gregary CromerLee,  Jennifer M - 05/30/2016 12:54 EDT   Rehabilitation Stairs Grid     Outside Stairs          Number of Stairs :    0                 Gregary CromerLee,  Jennifer M - 05/30/2016 12:54 EDT         Living Situation :   Home with family support   Job Responsibilities :   Disabled   Outside Facility Information :   [facility]: [facility], Macky LowerAllison Mecloud  Rx: [facility]   Home Health Provider :   N/A   Barriers at Home :   None   Gregary CromerLee,  Jennifer M - 05/30/2016 12:54 EDT   Home Environment   Home Equipment Rehab :   [employer]   ADLs :   Minimal assist   Gregary CromerLee,  Jennifer M - 05/30/2016 12:54 EDT   Discharge Needs I   Previously Documented Discharge Needs :   DISCHARGE PLAN/NEEDS:  Discharge To, Anticipated: Home with family support Gemma Payor- Barr,  Melissa - 05/29/16 17:41:00  EQUIPMENT/TREATMENT NEEDS:       Previously Documented Benefits Information :   No discharge data available.     Anticipated Discharge Date :   05/30/2016 EDT   Anticipated Discharge Time Slot :   1400-1600   Discharge To :   Home with family support   Home Caregiver Name/Relationship :   Rosie FateMary Tapp/spouse   Home Caregiver Phone Number :   [phone]   CM Progress Note :   05/30/16 jml: Pt is a 55 y/omale admitted w/vertigo and dizziness. He is on disability due to back pain issues. He and his family live together in [place]. He may go home today w/no CM needs.     Gregary CromerLee,  Jennifer M - 05/30/2016 12:54 EDT   Discharge Needs II   Professional Skilled Services :   No Needs   Needs Assistance with Transportation :   No   Needs Assistance at Home Upon Discharge :   No   Discharge Planning Time Spent :   20 minutes   Gregary CromerLee,   Jennifer M - 05/30/2016 12:54 EDT   Benefits   Insurance Information :   Managed Medicare   Gregary CromerLee,  Jennifer M - 05/30/2016 12:54 EDT   Advance Directive   *Advance Directive :   No   Patient Wishes to Receive Further Information on Advance Directives :   No   Gregary CromerLee,  Jennifer M - 05/30/2016 12:54 EDT

## 2016-06-11 NOTE — Discharge Summary (Signed)
Inpatient Patient Summary               [facility]  [address]  [place], [place] [id number]  [phone]  Patient Discharge Instructions    Name: Jimmy DessertRNOLD, Jimmy Perkins  Current Date: 06/11/2016 10:59:26  DOB: December 15, 1960 MRN: [id number] FIN: NBR%[phone]  Patient Address: [address] [place] [id number]  Patient Phone: [phone]  Primary Care Provider:  Name: Pcp, None  Phone:    Immunizations Provided:      Discharge Diagnosis: TIA (transient ischemic attack)  Discharged To: TO, ANTICIPATED%>Home with family support  Home Treatments: TREATMENTS, ANTICIPATED%>  Devices/Equipment: EQUIPMENT REHAB%>  Post Hospital Services: HOSPITAL SERVICES%>  Professional Skilled Services: SKILLED SERVICES%>  Therapist, sportspecial Services and Community Resources: SERV AND COMM RES, ANTICIPATED%>  Mode of Discharge Transportation: TRANSPORTATION%>  Discharge Orders         Discharge Patient 06/11/16 10:46:00 EDT        Comment:     Medications   During the course of your visit, your medication list was updated with the most current information. The details of those changes are reflected below:         Medications That Have Not Changed  Other Medications  albuterol (albuterol CFC free 90 mcg/inh inhalation aerosol) 1 Puffs Inhale (breathe in) every 4 hours as needed shortness of breath or wheezing.  Last Dose:____________________  aspirin (aspirin 81 mg oral tablet, chewable) 4 Tabs Chewed every day.  Last Dose:____________________  atorvastatin (atorvastatin 80 mg oral tablet) 1 Tabs Oral (given by mouth) every day.  Last Dose:____________________  baclofen (baclofen 10 mg oral tablet) 1 Tabs Oral (given by mouth) every day.  Last Dose:____________________  cyanocobalamin (cyanocobalamin 1000 mcg/mL injectable solution) 1,000 Microgram Intramuscular (in a muscle) once a month.  Last Dose:____________________  cycloSPORINE ophthalmic (Restasis 0.05% ophthalmic emulsion) 1 Drops Both eyes every 12 hours.  Last  Dose:____________________  fluticasone nasal (fluticasone 50 mcg/inh nasal spray) 2 Sprays Nasal (into the nose) every day.  Last Dose:____________________  montelukast (Singulair 4 mg oral granule) 1 Each Oral (given by mouth) every day.  Last Dose:____________________  multivitamin with minerals (Celebrate Multivitamin) 1 Tabs Oral (given by mouth) every day.  Last Dose:____________________  ondansetron (ondansetron 8 mg oral tablet) 1 Tabs Oral (given by mouth) every 6 hours as needed nausea/vomiting.  Last Dose:____________________  promethazine (promethazine 25 mg oral tablet) 1 Tabs Oral (given by mouth) every 6 hours as needed nausea.  Last Dose:____________________  sertraline (sertraline 100 mg oral tablet) 1 Tabs Oral (given by mouth) every day.  Last Dose:____________________  solifenacin (VESIcare 5 mg oral tablet) 1 Tabs Oral (given by mouth) every day.  Last Dose:____________________  tapentadol (Nucynta 75 mg oral tablet) 1 Tabs Oral (given by mouth) every 6 hours.  Last Dose:____________________  traZODone (traZODone 50 mg oral tablet) 1 Tabs Oral (given by mouth) Once a Day (at bedtime).  Last Dose:____________________  zolpidem (zolpidem 10 mg oral tablet) 1 Tabs Oral (given by mouth) Once a Day (at bedtime) as needed., " THIS MEDICATION IS ASSOCIATED WITH AN INCREASED RISK OF FALLS."  Last Dose:____________________        [facility] would like to thank you for allowing us to assist you with your healthcare needs. The following includes patient education materials and information regarding your injury/illness.    Jimmy Perkins has been given the following list of follow-up instructions, prescriptions, and patient education materials:  Follow-up Instructions:  With: Address: When:   [facility] [address]  [place], [place]  [address]  [phone] Within 1 month                    Type Location Start [facility]   Surgery [facility] Cath Lab 06/11/2016 11:00:00 06/11/2016  12:00:00 Confirmed   Surgery [facility] Endoscopy 06/23/2016 11:15:00 06/23/2016 12:00:00 Confirmed   SL Home Sleep Test [facility] Sleep Lab 07/01/2016 19:30:00 07/01/2016 20:30:00 Confirmed             It is important to always keep an active list of medications available so that you can share with other providers and manage your medications appropriately. As an additional courtesy, we are also providing you with your final active medications list that you can keep with you.          albuterol (albuterol CFC free 90 mcg/inh inhalation aerosol) 1 Puffs Inhale (breathe in) every 4 hours as needed shortness of breath or wheezing.  aspirin (aspirin 81 mg oral tablet, chewable) 4 Tabs Chewed every day.  atorvastatin (atorvastatin 80 mg oral tablet) 1 Tabs Oral (given by mouth) every day.  baclofen (baclofen 10 mg oral tablet) 1 Tabs Oral (given by mouth) every day.  cyanocobalamin (cyanocobalamin 1000 mcg/mL injectable solution) 1,000 Microgram Intramuscular (in a muscle) once a month.  cycloSPORINE ophthalmic (Restasis 0.05% ophthalmic emulsion) 1 Drops Both eyes every 12 hours.  fluticasone nasal (fluticasone 50 mcg/inh nasal spray) 2 Sprays Nasal (into the nose) every day.  montelukast (Singulair 4 mg oral granule) 1 Each Oral (given by mouth) every day.  multivitamin with minerals (Celebrate Multivitamin) 1 Tabs Oral (given by mouth) every day.  ondansetron (ondansetron 8 mg oral tablet) 1 Tabs Oral (given by mouth) every 6 hours as needed nausea/vomiting.  promethazine (promethazine 25 mg oral tablet) 1 Tabs Oral (given by mouth) every 6 hours as needed nausea.  sertraline (sertraline 100 mg oral tablet) 1 Tabs Oral (given by mouth) every day.  solifenacin (VESIcare 5 mg oral tablet) 1 Tabs Oral (given by mouth) every day.  tapentadol (Nucynta 75 mg oral tablet) 1 Tabs Oral (given by mouth) every 6 hours.  traZODone (traZODone 50 mg oral tablet) 1 Tabs Oral (given by mouth) Once a Day (at bedtime).  zolpidem (zolpidem 10 mg oral  tablet) 1 Tabs Oral (given by mouth) Once a Day (at bedtime) as needed., " THIS MEDICATION IS ASSOCIATED WITH AN INCREASED RISK OF FALLS."      Take only the medications listed above. Contact your doctor prior to taking any medications not on this list.      Discharge instructions, if any, will display below    Instructions for Diet: INSTRUCTIONS FOR DIET%>   Instructions for Supplements: SUPPLEMENT INSTRUCTIONS%>   Instructions for Activity: INSTRUCTIONS FOR ACTIVITY%>   Instructions for Wound Care: INSTRUCTIONS FOR WOUND CARE%>    Medication leaflets, if any, will display below     Patient education materials, if any, will display below        Transesophageal Echocardiography (TEE)        Transesophageal echocardiography (TEE) is a test done to record images of your heart with a probe inside your esophagus. These images help your healthcare provider find and treat problems such as infection, disease, or defects in your hearts function, walls or valves. This test may be done when a chest echocardiogram (transthoracic) does not give your provider enough information.   Before your test    Tell  your provider about all the medicines you take. Ask if its OK to take them before the test.    Dont eat or drink for 6 to 8 hours before the test. This includes water.    Tell your healthcare provider if you have ulcers, a hiatal hernia, or problems swallowing. Also report a history of narrowing of the esophagus, or any other previous gastrointestinal problems.  Also, let him or her know of any allergies to medicines or sedatives.    Also let your provider know if you have dental implants or dentures that should be removed before the test.    Arrange to have someone drive you home after the exam.   During your TEE    When you arrive for your TEE, you will change into a hospital gown, and then be taken to the testing room.    Your provider will spray your throat with a numbing medicine. You may be given a medicine through  an IV (intravenous) in your arm to help you relax. You may also be given oxygen. Then youll be asked to lie on your left side.    The healthcare provider gently inserts the small, lubricated probe into your mouth. As you swallow, he or she will slowly guide the tube into your esophagus.    You may feel the healthcare provider moving the probe, but it shouldnt hurt or interfere with your breathing. A nurse checks your heart rate, blood pressure, and breathing. The test usually takes 20 to 40 minutes.    The nurse or assistant will suction any saliva out of your mouth, similar to when you have a dental cleaning.   After the test    Tell your healthcare provider about any pain, or if you cough up or vomit blood, or have trouble swallowing.    You can eat and drink again when your throat is no longer numb.    Do not drive a car or run heavy machinery for at least 24 hours after getting sedation. After 24 hours you can return to normal activity unless your healthcare provider tells you otherwise.    Be sure to keep your follow-up appointment to go over the results with your healthcare provider.    Your next appointment is: ____________________      [id number] [employer] [address], [place], [place] [address]. All rights reserved. This information is not intended as a substitute for professional medical care. Always follow your healthcare professional's instructions.         Procedural Sedation   Procedural sedation is medicine to ease discomfort, pain, and anxiety during a procedure. The medicine is often given through an intravenous (IV) line in your arm or hand. In some cases, the medicine may be taken by mouth or inhaled. While you are under sedation, you will likely be awake. But you may not remember anything afterward.   Why procedural sedation is used   Sedation is used for many types of procedures. The goal is to reduce pain, anxiety, and stressful memories of a procedure. It can also  help your health care provider treat you. For example, having a broken bone fixed may be easier if you feel relaxed.   Procedural sedation is used only for short, basic procedures. It is not used for complex surgeries. Some procedures that use this type of sedation include:    Dental surgery    Breast biopsy, to take a sample of breast tissue    Endoscopy,  to look at gastrointestinal problems    Bronchoscopy, to check for lung problems    Bone or joint realignment, to fix a broken bone or dislocated joint    Minor foot or skin surgery    Electrical cardioversion, to restore a normal heart rhythm    Lumbar puncture, to assess neurological disease   Risks of procedural sedation   Procedural sedation has some risks and possible side effects, such as:    Headache    Nausea and vomiting    Unpleasant memory of the procedure    Lowered rate of breathing    Changes in heart rate and blood pressure (rare)    Inhalation of stomach contents into your lungs (rare)   Side effects will likely go away shortly after the procedure. Your health care team will watch your heart rate and breathing during your sedation. This is to help prevent problems.   Your own risks may vary based on your age and your overall health. They also depend on the type of sedation you are given. Talk with your health care provider about the risks that apply most to you.   Getting ready for procedural sedation   Talk with your health care provider how to get ready for your procedure. Tell him or her about all the medicines you take. This includes over-the-counter medicines such as ibuprofen. It also includes vitamins, herbs, and other supplements. You may need to stop taking some medicines before the procedure, such as blood thinners and aspirin. If you smoke, you may need to stop. Talk with your health care provider if you need help to stop smoking.   Tell your health care provider if you:    Have had any problems in the past with sedation  or anesthesia    Have had any recent changes in your health, such as an infection or fever    Are pregnant or think you may be pregnant   Also, make sure to:    Ask a family member or friend to take you home after the procedure. You cannot drive on the day you receive sedation.    Not eat or drink after midnight the night before your procedure, if advised.    Follow all other instructions from your health care provider.   During your procedural sedation   You may have your procedure in a hospital or a medical clinic. Sedation is done by a trained health care provider. In general, you can expect the following:    You will be given medicine through an IV line in your arm or hand. Or you may receive a shot. The medicine may also be given by mouth. Or you may inhale it through a mask.    If you receive medicine through an IV, you may feel the effects very quickly. You will start to feel relaxed and drowsy.    During the procedure, your heart rate, breathing, and blood pressure will be closely watched. Your breathing and blood pressure may decrease a little. But you will likely not need help with your breathing. You may receive a little extra oxygen through a mask.    You will probably be awake the entire time. If you do fall asleep, you should be easy to wake up, if needed. You should feel little or no pain.    When your procedure is over, the sedative medicine will be stopped.   After your procedural sedation   You will begin to feel more awake and aware.  But you will likely be drowsy for a while afterward. You will be closely watched as you become more alert. You may have a faint memory of the procedure. Or you may not remember it at all.   You should be able to return home within an hour or two after your procedure. Plan to have someone stay with you for a few hours. Side effects such as headache and nausea may go away quickly. Tell your health care provider if they continue.   Dont drive or make any  important decisions for at least 24 hours. Be sure to follow all after-care instructions.       When to call your health care provider   Have someone call your health care provider right away if you have any of these:    Drowsiness that gets worse    Weakness or dizziness that gets worse    Repeated vomiting    You cant be awakened      2000-2017 [employer] [address] [place], [place] [address]. All rights reserved. This information is not intended as a substitute for professional medical care. Always follow your healthcare professional's instructions.           IS IT A STROKE? Act FAST and Check for these signs:    FACE                         Does the face look uneven?    ARM                         Does one arm drift down?    SPEECH                    Does their speech sound strange?    TIME                         Call 9-1-1 at any sign of stroke  Heart Attack Signs  Chest discomfort: Most heart attacks involve discomfort in the center of the chest and lasts more than a few minutes, or goes away and comes back. It can feel like uncomfortable pressure, squeezing, fullness or pain.  Discomfort in upper body: Symptoms can include pain or discomfort in one or both arms, back, neck, jaw or stomach.  Shortness of breath: With or without discomfort.  Other signs: Breaking out in a cold sweat, nausea, or lightheaded.  Remember, MINUTES DO MATTER. If you experience any of these heart attack warning signs, call 9-1-1 to get immediate medical attention!     ---------------------------------------------------------------------------------------------------------------------  [facility] allows you to manage your health, view your test results, and retrieve your discharge documents from your hospital stay securely and conveniently from your computer.  To begin the enrollment process, visit [email or web address] Click on "Sign up now" under [facility].   Yes - Patient/Family/Caregiver  demonstrates understanding of instructions given        ______________________________                                 ___________________    Patient/Family/ Caregiver Signature  Date/Time     ______________________________                                 ___________________    Provider Signature                                                                                        Date/Time

## 2016-06-11 NOTE — Nursing Note (Signed)
Adult Patient History Form-Text       Adult Patient History Entered On:  06/11/2016 9:35 EDT    Performed On:  06/11/2016 9:32 EDT by Freida Busman, RN, Kayren Eaves               General Info   Preferred Name :   Jimmy Perkins   Admitted From :   home   Mode of Arrival on Unit :   Ambulatory   In Charge of News (ICON) Name :   leevi cullars (wife) [phone]   In Clinical Trial With Signed Consent for Related Condition :   No signed consent for clinical trial   Information Given By :   Self   Primary Language :   English   Preferred Mode of Communication :   Verbal, Written   Pregnancy Status :   N/A   Has the patient received chemotherapy or biotherapy within the last 48 hours? :   No   Is the patient currently (2-3 days) receiving radiation treatment? :   No   ALLEN, RN, KYRIE L - 06/11/2016 9:32 EDT   Problem History   (As Of: 06/11/2016 09:35:13 EDT)   Problems(Active)    Back pain (SNOMED CT  :[id number] )  Name of Problem:   Back pain ; Recorder:   PORTER, RN, EVA G; Confirmation:   Confirmed ; Classification:   Medical ; Code:   [id number] ; Contributor System:   PowerChart ; Last Updated:   10/16/2015 12:11 EST ; Life Cycle Date:   10/16/2015 ; Life Cycle Status:   Active ; Vocabulary:   SNOMED CT        DISH (diffuse idiopathic skeletal hyperostosis) (IMO  :[id number] )  Name of Problem:   DISH (diffuse idiopathic skeletal hyperostosis) ; Recorder:   PORTER, RN, EVA G; Confirmation:   Confirmed ; Classification:   Medical ; Code:   [id number] ; Contributor System:   Dietitian ; Last Updated:   10/16/2015 12:12 EST ; Life Cycle Date:   10/16/2015 ; Life Cycle Status:   Active ; Vocabulary:   IMO        Kidney stones (IMO  :[id number] )  Name of Problem:   Kidney stones ; Recorder:   PORTER, Charity fundraiser, EVA G; Confirmation:   Confirmed ; Classification:   Medical ; Code:   [id number] ; Contributor System:   Dietitian ; Last Updated:   10/16/2015 12:11 EST ; Life Cycle Date:   10/16/2015 ; Life Cycle Status:   Active ; Vocabulary:   IMO         Migraine (IMO  :29528 )  Name of Problem:   Migraine ; Recorder:   PORTER, RN, EVA G; Confirmation:   Confirmed ; Classification:   Medical ; Code:   [id number] ; Contributor System:   Dietitian ; Last Updated:   10/16/2015 12:11 EST ; Life Cycle Date:   10/16/2015 ; Life Cycle Status:   Active ; Vocabulary:   IMO        Nerve damage (SNOMED CT  :[id number] )  Name of Problem:   Nerve damage ; Recorder:   Marliss Czar, RN, Bridgette Habermann; Confirmation:   Confirmed ; Classification:   Medical ; Code:   [id number] ; Contributor System:   Dietitian ; Last Updated:   05/12/2016 11:02 EDT ; Life Cycle Date:   05/12/2016 ; Life Cycle Status:   Active ; Vocabulary:   SNOMED CT        Stimulator, device (SNOMED CT  :  [id number] )  Name of Problem:   Stimulator, device ; Recorder:   Gemma PayorBarr,  Melissa; Confirmation:   Confirmed ; Classification:   Medical ; Code:   [id number] ; Contributor System:   DietitianowerChart ; Last Updated:   05/29/2016 17:51 EDT ; Life Cycle Date:   05/29/2016 ; Life Cycle Status:   Active ; Vocabulary:   SNOMED CT        TIA (SNOMED CT  :[id number] )  Name of Problem:   TIA ; Recorder:   ALLEN, RN, KYRIE L; Confirmation:   Confirmed ; Classification:   Patient Stated ; Code:   [id number] ; Contributor System:   PowerChart ; Last Updated:   06/11/2016 9:33 EDT ; Life Cycle Date:   06/11/2016 ; Life Cycle Status:   Active ; Vocabulary:   SNOMED CT          Family History   Family History   (As Of: 06/11/2016 09:35:13 EDT)   Father:   Relation:   Father ; Gender:   Male ;           Nomenclature:   Diabetes mellitus ; Value:   Positive            Nomenclature:   Stroke ; Value:   Positive            Nomenclature:   Parkinson disease ; Value:   Positive          Mother:   Relation:   Mother ; Gender:   Male ;           Nomenclature:   Breast cancer ; Value:   Positive            Allergy   (As Of: 06/11/2016 09:35:13 EDT)   Allergies (Active)   OxyCONTIN  Estimated Onset Date:   Unspecified  ; Created ByHale Bogus:   PORTER, RN, EVA G; Reaction Status:   Active ; Category:   Drug ; Substance:   OxyCONTIN ; Type:   Allergy ; Severity:   Mild ; Updated By:   Hale BogusPORTER, RN, EVA G; Reviewed Date:   05/29/2016 14:30 EDT      Reglan  Estimated Onset Date:   Unspecified ; Created By:   Hale BogusPORTER, RN, EVA G; Reaction Status:   Active ; Category:   Drug ; Substance:   Reglan ; Type:   Allergy ; Severity:   Mild ; Updated By:   Hale BogusPORTER, RN, EVA G; Reviewed Date:   05/29/2016 14:30 EDT        Immunizations   Immunizations Current :   Unknown   Last Tetanus :   Greater than 5 years   Influenza Vaccine Status :   Not received   Influenza Vaccine Refused :   Yes   Pneumococcal Vaccine Status :   Received prior to admission   Pneumococcal Vaccine Refused :   Yes   ALLEN, RN, KYRIE L - 06/11/2016 9:32 EDT   ID Risk Screen   Patient Recent Travel History :   No recent travel   Family Member Travel History :   No recent travel   [place]EN, RN, [place] L - 06/11/2016 9:32 EDT   Infectious Disease Risk Factor Grid   Chills :   No   Fever :   No   Fatigue :   No   Headache :   No   Runny or Stuffy Nose :   No   Sore Throat :   No  Difficulty Breathing :   No   Shortness of Breath :   No   New or Worsening Cough :   No   Wheezing :   No   Vomiting :   No   Diarrhea :   No   Abdominal (Stomach Pain) :   No   Muscle Pain :   No   Weakness/Numbness :   No   Recent Exposure to Communicable Disease :   No   Illness With Generalized Rash :   No   Abnormal Bleeding :   No   Unexplained Hemorrhage (Bleeding or Bruising) :   No   Arthralgia :   No   Conjunctivitis :   No   ALLEN, RN, KYRIE L - 06/11/2016 9:32 EDT   MRSA/VRE Screening :   No   ALLEN, RN, KYRIE L - 06/11/2016 9:32 EDT   Chills :   No   Cough (Any Duration) :   No   Fever :   No   Hemoptysis (Blood in Sputum) :   No   Night Sweats :   No   Weight Loss Greater Than 10 Pounds :   No   Hx of TB Now or at Any Time In the Past (Even if on Meds) :   No   Foreign-Born :   No   Homeless or In Shelter :   No    Incarcerated Within Last 2 Years :   No   Intravenous Drug User :   No   Male Homosexual :   No   New TST/IGRA Results Pos(Within 2 yrs),Hx Recent TB Exposure :   No   ALLEN, RN, KYRIE L - 06/11/2016 9:32 EDT   Does the Patient Have any of the Following Conditions That Compromise the Immune System :   None   ALLEN, RN, KYRIE L - 06/11/2016 9:32 EDT   C. diff Screen   Have you had 3 or more loose/watery stool in 24 hours? :   No   ALLEN, RN, KYRIE L - 06/11/2016 9:32 EDT   Procedure History        -    Procedure History   (As Of: 06/11/2016 09:35:13 EDT)     Anesthesia Minutes:   0 ; Procedure Name:   Stimulator ; Procedure Minutes:   0 ; Comments:     10/16/2015 12:13 - PORTER, RN, EVA G  back            Anesthesia Minutes:   0 ; Procedure Name:   Lung adhesion ; Procedure Minutes:   0 ; Comments:     10/16/2015 12:14 - PORTER, RN, EVA G  decortication of left lower lung            Anesthesia Minutes:   0 ; Procedure Name:   Cholecystectomy ; Procedure Minutes:   0            Anesthesia Minutes:   0 ; Procedure Name:   Colon ; Procedure Minutes:   0 ; Comments:     10/16/2015 12:13 - PORTER, RN, EVA G  part of colon removed            Anesthesia Minutes:   0 ; Procedure Name:   Back ; Procedure Minutes:   0 ; Comments:     10/16/2015 12:12 - PORTER, RN, EVA G  x2            Anesthesia Minutes:   0 ;  Procedure Name:   Gastric ; Procedure Minutes:   0 ; Comments:     10/16/2015 12:12 - PORTER, RN, EVA G  bypass            Anesthesia/Sedation   Anesthesia History :   Prior general anesthesia   Anesthesia Reaction :   None   Moderate Sedation History :   Prior sedation for procedure   Previous Problem With Sedation :   None   ALLEN, RN, KYRIE L - 06/11/2016 9:32 EDT   Transfusion/Bloodless Med   Transfusion History :   No prior transfusion   Will Patient Accept Blood Transfusion and/or Blood Products :   Yes   ALLEN, RN, KYRIE L - 06/11/2016 9:32 EDT   Nutrition   Home Diet :   Regular   Appetite :   Fair   Feeding Ability :    Complete independence   Home Liquid Viscosity :   Thin   ALLEN, RN, KYRIE L - 06/11/2016 9:32 EDT   Functional   Cognitive Function Concerns Prior to Admission :   None reported   Fear of Falling :   No   Ability to Ambulate Prior to Admission :   Independent   Ability to Move in Bed Prior to Admission :   Independent   ADLs :   Minimal assist   Current Level of Assistance for Self-Care/Mobility :   No change from baseline   Usual Hours of Sleep :   8 hr   ALLEN, RN, KYRIE L - 06/11/2016 9:32 EDT   Functional Assessment   Bathing :   Independent (2)   Dressing :   Requires assistance (1)   Toileting :   Independent (2)   Transferring Bed or Chair :   Requires assistance (1)   Continence :   Independent (2)   Feeding :   Independent (2)   ADL Index Score :   10    ALLEN, RN, KYRIE L - 06/11/2016 9:32 EDT   Living and Resources   Living Situation :   Home with family support   Lines/Tubes Present on Admission :   None   Freida Busman, RN, Kayren Eaves - 06/11/2016 9:32 EDT   Social History   Social History   (As Of: 06/11/2016 09:35:14 EDT)   Tobacco:        Former smoker   (Last Updated: 06/11/2016 09:34:51 EDT by Freida Busman, RN, KYRIE L)          Alcohol:        Denies   (Last Updated: 06/11/2016 09:34:52 EDT by Freida Busman, RN, KYRIE L)          Substance Abuse:        Denies   (Last Updated: 06/11/2016 09:34:54 EDT by Freida Busman, RN, KYRIE L)            Advance Directive   *Advance Directive :   No   Patient Wishes to Receive Further Information on Advance Directives :   No   ALLEN, RN, Kayren Eaves - 06/11/2016 9:32 EDT   Educ Needs   Patient/Family Learning Style Preferences   Patient :   Verbal explanation   Family :   Verbal explanation   Freida Busman, RN, KYRIE L - 06/11/2016 9:32 EDT   Barriers to Learning :   None evident   Preventative Measures Information Given :   Hand Hygiene, Environmental Safety, Pain Scale, Unit/Room Orientation   ALLEN, RN, KYRIE L - 06/11/2016 9:32 EDT  DC Needs   Discharge To :   Home with family support   Home Caregiver  Name/Relationship :   Toussaint Golson   Home Caregiver Phone Number :   [phone]   Lines/Tubes Anticipated After Discharge :   None   Professional Skilled Services :   No Needs   Needs Assistance with Transportation :   No   Needs Assistance At Home Upon Discharge :   No   Freida Busman RN, KYRIE L - 06/11/2016 9:32 EDT

## 2016-06-11 NOTE — Nursing Note (Signed)
Basic Admission Information - Text       Basic Admission Information Adult Entered On:  06/11/2016 9:28 EDT    Performed On:  06/11/2016 9:27 EDT by Lyna Poser H               Vital Signs   Temperature Oral :   37.1 degC   Peripheral Pulse Rate :   77 bpm   Respiratory Rate :   18 br/min   Systolic/ Diastolic BP :   119 mmHg   Diastolic Blood Pressure :   80 mmHg   SpO2 :   94 %   WILSON,  ANNETTE H - 06/11/2016 9:27 EDT   Height/Weight   Weight Dosing :   89 kg(Converted to: 196 lb 3 oz, 196.211 lb)    Weight Measured :   89 kg(Converted to: 196 lb 3 oz, 196.211 lb)    Height/Length Measured :   170.2 cm(Converted to: 5 ft 7 in, 5.58 ft, 67.01 in)    BSA Measured :   2.01 m2   Body Mass Index Measured :   30.72 kg/m2   Ideal Body Weight Calculated :   66.118 kg   Wilmer Floor - 06/11/2016 9:27 EDT

## 2016-06-11 NOTE — Nursing Note (Signed)
Nursing Discharge Summary - Text       Nursing Discharge Summary Entered On:  06/11/2016 11:00 EDT    Performed On:  06/11/2016 10:59 EDT by Freida BusmanALLEN, RN, Kayren EavesKYRIE L               DC Information   Discharge To, Anticipated :   Home with family support   Professional Skilled Services, Anticipated :   No Needs   Mode of Discharge :   Wheelchair   Transportation :   Private vehicle   Accompanied By :   Lacie DraftSpouse   ALLEN, RN, Kayren EavesKYRIE L - 06/11/2016 10:59 EDT   Education   Responsible Learner(s) :   Living Situation: Home with family support        Performed by: Luther RedoALLEN, RN, KYRIE L - 06/11/2016 09:32  Discharge To: Home with family support        Performed by: Fortino SicALLEN, RN, Kayren EavesKYRIE L - 06/11/2016 09:32  Home Caregiver Name/Relationship: Rosie FateMary Wages/spouse        Performed by: Fortino SicALLEN, RN, Kayren EavesKYRIE L - 06/11/2016 09:32  Home Caregiver Phone Number: [phone]        Performed by: Luther RedoALLEN, RN, KYRIE L - 06/11/2016 09:32     Home Caregiver Present for Session :   Yes   Barriers To Learning :   None evident   Teaching Method :   Printed materials, Teach-back   Freida BusmanALLEN, RN, Kayren EavesKYRIE L - 06/11/2016 10:59 EDT   Post-Hospital Education Adult Grid   Activity Expectations :   Verbalizes understanding   Importance of Follow-Up Visits :   Verbalizes understanding   Physical Limitations :   Verbalizes understanding   Plan of Care :   Verbalizes understanding   Postoperative Instructions :   Verbalizes understanding   When to [facility] Health Care Provider :   [facility]   [place], RN, Kayren EavesKYRIE L - 06/11/2016 10:59 EDT   Medication Education Adult Grid   Drug to Drug Interactions :   Verbalizes understanding   Med Dosage, Route, Scheduling :   TEFL teacherVerbalizes understanding   Med Generic/Brand Name, Purpose, Action :   Verbalizes understanding   Med Preadministration Procedures :   [employer]ers SquibbVerbalizes understanding   Medication Precautions :   IT sales professionalVerbalizes understanding   Safety, Medication :   Verbalizes understanding   Freida BusmanLLEN, RN, Kayren EavesKYRIE L - 06/11/2016 10:59 EDT   Safety  Education Adult Grid   Safety, Fall :   Verbalizes understanding   [place], RN, Kayren EavesKYRIE L - 06/11/2016 10:59 EDT   Additional Learner(s) Present :   Spouse   Time Spent Educating Patient :   15 minutes   ALLEN, RN, KYRIE L - 06/11/2016 10:59 EDT

## 2016-06-11 NOTE — Discharge Summary (Signed)
Inpatient Clinical Summary             [facility]  Post-Acute Care Transfer Instructions  PERSON INFORMATION   Name: Jimmy Perkins, Jimmy Perkins   MRN: [id number]    FIN#: [id number]   PHYSICIANS  Admitting Physician: RIEDER-MD,  JEFFREY  Attending Physician: RIEDER-MD,  JEFFREY   PCP: Pcp, None  Discharge Diagnosis: TIA (transient ischemic attack)  Comment:       PATIENT EDUCATION INFORMATION  Instructions:             Transesophageal Echocardiography (TEE); Procedural Sedation  Medication Leaflets:               Follow-up:                          With: Address: When:   [facility] [address]  [place], [place]  [address]  [phone] Within 1 month                                 Type Location Start [facility]   Surgery [facility] Cath Lab 06/11/2016 11:00:00 06/11/2016 12:00:00 Confirmed   Surgery [facility] Endoscopy 06/23/2016 11:15:00 06/23/2016 12:00:00 Confirmed   SL Home Sleep Test [facility] Sleep Lab 07/01/2016 19:30:00 07/01/2016 20:30:00 Confirmed          MEDICATION LIST  Medication Reconciliation at Discharge:         Medications That Have Not Changed  Other Medications  albuterol (albuterol CFC free 90 mcg/inh inhalation aerosol) 1 Puffs Inhale (breathe in) every 4 hours as needed shortness of breath or wheezing.  Last Dose:____________________  aspirin (aspirin 81 mg oral tablet, chewable) 4 Tabs Chewed every day.  Last Dose:____________________  atorvastatin (atorvastatin 80 mg oral tablet) 1 Tabs Oral (given by mouth) every day.  Last Dose:____________________  baclofen (baclofen 10 mg oral tablet) 1 Tabs Oral (given by mouth) every day.  Last Dose:____________________  cyanocobalamin (cyanocobalamin 1000 mcg/mL injectable solution) 1,000 Microgram Intramuscular (in a muscle) once a month.  Last Dose:____________________  cycloSPORINE ophthalmic (Restasis 0.05% ophthalmic emulsion) 1 Drops Both eyes every 12 hours.  Last Dose:____________________  fluticasone nasal (fluticasone 50 mcg/inh nasal spray) 2 Sprays  Nasal (into the nose) every day.  Last Dose:____________________  montelukast (Singulair 4 mg oral granule) 1 Each Oral (given by mouth) every day.  Last Dose:____________________  multivitamin with minerals (Celebrate Multivitamin) 1 Tabs Oral (given by mouth) every day.  Last Dose:____________________  ondansetron (ondansetron 8 mg oral tablet) 1 Tabs Oral (given by mouth) every 6 hours as needed nausea/vomiting.  Last Dose:____________________  promethazine (promethazine 25 mg oral tablet) 1 Tabs Oral (given by mouth) every 6 hours as needed nausea.  Last Dose:____________________  sertraline (sertraline 100 mg oral tablet) 1 Tabs Oral (given by mouth) every day.  Last Dose:____________________  solifenacin (VESIcare 5 mg oral tablet) 1 Tabs Oral (given by mouth) every day.  Last Dose:____________________  tapentadol (Nucynta 75 mg oral tablet) 1 Tabs Oral (given by mouth) every 6 hours.  Last Dose:____________________  traZODone (traZODone 50 mg oral tablet) 1 Tabs Oral (given by mouth) Once a Day (at bedtime).  Last Dose:____________________  zolpidem (zolpidem 10 mg oral tablet) 1 Tabs Oral (given by mouth) Once a Day (at bedtime) as needed., " THIS MEDICATION IS ASSOCIATED WITH AN INCREASED RISK OF FALLS."  Last Dose:____________________         Patient's Final  Home Medication List Upon Discharge:          albuterol (albuterol CFC free 90 mcg/inh inhalation aerosol) 1 Puffs Inhale (breathe in) every 4 hours as needed shortness of breath or wheezing.  aspirin (aspirin 81 mg oral tablet, chewable) 4 Tabs Chewed every day.  atorvastatin (atorvastatin 80 mg oral tablet) 1 Tabs Oral (given by mouth) every day.  baclofen (baclofen 10 mg oral tablet) 1 Tabs Oral (given by mouth) every day.  cyanocobalamin (cyanocobalamin 1000 mcg/mL injectable solution) 1,000 Microgram Intramuscular (in a muscle) once a month.  cycloSPORINE ophthalmic (Restasis 0.05% ophthalmic emulsion) 1 Drops Both eyes every 12  hours.  fluticasone nasal (fluticasone 50 mcg/inh nasal spray) 2 Sprays Nasal (into the nose) every day.  montelukast (Singulair 4 mg oral granule) 1 Each Oral (given by mouth) every day.  multivitamin with minerals (Celebrate Multivitamin) 1 Tabs Oral (given by mouth) every day.  ondansetron (ondansetron 8 mg oral tablet) 1 Tabs Oral (given by mouth) every 6 hours as needed nausea/vomiting.  promethazine (promethazine 25 mg oral tablet) 1 Tabs Oral (given by mouth) every 6 hours as needed nausea.  sertraline (sertraline 100 mg oral tablet) 1 Tabs Oral (given by mouth) every day.  solifenacin (VESIcare 5 mg oral tablet) 1 Tabs Oral (given by mouth) every day.  tapentadol (Nucynta 75 mg oral tablet) 1 Tabs Oral (given by mouth) every 6 hours.  traZODone (traZODone 50 mg oral tablet) 1 Tabs Oral (given by mouth) Once a Day (at bedtime).  zolpidem (zolpidem 10 mg oral tablet) 1 Tabs Oral (given by mouth) Once a Day (at bedtime) as needed., " THIS MEDICATION IS ASSOCIATED WITH AN INCREASED RISK OF FALLS."         Comment:       ORDERS         Order Name Order Details   Discharge Patient 06/11/16 10:46:00 EDT

## 2016-06-23 NOTE — Procedures (Signed)
Procedure Record - [facility]             Procedure Record - SFEND Summary                                                                Primary Physician:        Daun Peacock    Case Number:              [id number]    Finalized Date/Time:      06/23/16 11:47:46    Pt. Name:                 Jimmy Perkins, Jimmy Perkins    D.O.B./Sex:               1960-12-06    Male    Med Rec #:                [id number]    Physician:                Daun Peacock    Financial #:              [id number]    Pt. Type:                 S    Room/Bed:                 /    Admit/Disch:              06/23/16 09:21:00 -    Institution:       [facility] - Case Times                                                                                                        Entry 1                                                                                                          Patient      In Room Time             06/23/16 11:10:00               Out Room Time                   06/23/16 11:47:00    Anesthesia     Procedure  Start Time               06/23/16 11:16:00               Stop Time                       06/23/16 11:44:00    Last Modified By:         Elesa Massed RNAggie Cosier                              06/23/16 11:47:32      SFEND - Case Times Audit                                                                         06/23/16 11:47:32         Owner: Gillian Shields                               Modifier: OWENST                                                        <+> 1         Out Room Time     06/23/16 11:44:39         Owner: Gillian Shields                               Modifier: OWENST                                                        <+> 1         Stop Time     06/23/16 11:17:46         Owner: Gillian Shields                               Modifier: OWENST                                                        <+> 1         Start Time        SFEND - Case Attendance  Entry 1                         Entry 2                         Entry 3                                          Case Attendee             GOODEAR-MD,  GREGORY C          SANDER-CRNA,  MELINDA H         Ward, RN, Aggie Cosier    Role Performed            Surgeon Primary                 CRNA                            Circulator    Time In                   06/23/16 11:10:00               06/23/16 11:10:00               06/23/16 11:10:00    Time Out     Procedure                 Esophagastroduodenoscopy        Esophagastroduodenoscopy        Esophagastroduodenoscopy                              , Colonoscopy,                   with Biopsy / B,                with Biopsy / B,                              Esophagastroduodenoscopy        Colonoscopy with Polyp          Colonoscopy with Polyp                               with Biopsy / B,               Cold                            Cold                              Colonoscopy with Polyp                              Cold    Last Modified By:         Ward, RN, Carmell Austria, RN, Aggie Cosier  Ward, RN, Aggie Cosier                              06/23/16 11:30:42               06/23/16 11:30:42               06/23/16 11:30:42                                Entry 4                                                                                                          Case Attendee             Demaris Callander R    Role Performed            Endoscopy Technician    Time In                   06/23/16 11:10:00    Time Out     Procedure                 Esophagastroduodenoscopy                               with Biopsy / B,                              Colonoscopy with Polyp                              Cold    Last Modified By:         Elesa Massed, RN, Aggie Cosier                              06/23/16 11:30:42      SFEND - Case Attendance Audit                                                                    06/23/16 11:30:42         Owner: Gillian Shields                                Modifier: Gillian Shields  1     <*> Procedure                              Esophagastroduodenoscopy, Colonoscopy, Esophagastroduodenoscopy                                                             with Biopsy / B            2     <*> Procedure                              Esophagastroduodenoscopy with Biopsy / B            3     <*> Procedure                              Esophagastroduodenoscopy with Biopsy / B            4     <*> Procedure                              Esophagastroduodenoscopy with Biopsy / B     06/23/16 11:25:13         Owner: Gillian Shields                               Modifier: OWENST                                                            1     <*> Procedure                              Esophagastroduodenoscopy, Colonoscopy        <+> 2         Time In        <+> 2         Procedure        <+> 3         Time In        <+> 3         Procedure        <+> 4         Time In        <+> 4         Procedure     06/23/16 11:11:55         Owner: Gillian Shields                               Modifier: Gillian Shields  1     <+> Time In            1     <*> Procedure                              Esophagastroduodenoscopy, Colonoscopy        <+> 2         Case Attendee        <+> 2         Role Performed        <+> 3         Case Attendee        <+> 3         Role Performed        <+> 4         Case Attendee        <+> 4         Role Performed        SFEND - Patient Verification                                                                                              Entry 1                                                                                                          Patient Identity          ID band check, Patient          History/Physical on             Yes    Verified (select at       participation                   Chart     least 2):     Procedure Consent         Yes                              Site marked with                n/a    on Chart                                                  Initials or  Radiological                                                               guidance     Surgical Site             Yes                             Laterality Verified             n/a    Verified     Last Modified By:         Ward, RN, Aggie Cosier                              06/23/16 11:12:04      SFEND - Patient Positioning                                                                     Pre-Care Text:            A.240 Assesses baseline skin condition A.280 Identifies baseline musculoskeletal status A.280.1 Identifies           physical alterations that require additional precautions for procedure-specific positioning A.510.8 Maintains           patient's dignity and privacy Im.120 Implements protective measures to prevent skin/tissue injury due to           mechanical sources Im.40 Positions the patient Im.80 Applies safety devices                              Entry 1                                                                                                          Procedure                 Esophagastroduodenoscopy        Body Position                   Lateral Right Up                              , Colonoscopy,                              Esophagastroduodenoscopy  with Biopsy / B,                              Colonoscopy with Polyp                              Cold    Left Arm Position         Resting at Side                 Right Arm Position              Resting at Side    Left Leg Position         Extended                        Right Leg Position              Extended    Feet Uncrossed            Yes                             Pressure Points                 Yes                                                              Checked     Positioning Device        Foam Wedge                       Positioned By                   SANDER-CRNA,  Tonette Lederer,  BARSHELLA R    Outcome Met (O.80)        Yes    Last Modified By:         Ward, RN, Aggie Cosier                              06/23/16 11:30:45    Post-Care Text:            E.10 Evaluates for signs and symptoms of physical injury to skin and tissue E.290 Evaluates musculoskeletal           status O.80 Patient is free from signs and symptoms of injury related to positioning O.120 the patient is free           from signs and symptoms of injury related to transfer/transport  O.250 Patient's musculoskeletal status is           maintained at or improved from baseline levels      SFEND - Patient Positioning Audit                                                                06/23/16 11:30:45         Owner: Gillian Shields                               Modifier: OWENST                                                            1     <*> Procedure                              Esophagastroduodenoscopy, Colonoscopy, Esophagastroduodenoscopy                                                             with Biopsy / B     06/23/16 11:25:15         Owner: Gillian Shields                               Modifier: OWENST                                                            1     <*> Procedure                              Esophagastroduodenoscopy, Colonoscopy        SFEND - Skin Assessment                                                                         Pre-Care Text:            A.240 Assesses baseline skin condition Im.120 Implements protective measures to prevent skin or tissue injury           due to mechanical sources  Im.280.1 Implements progective measures to prevent skin or tissue injury due to           thermal sources Im.360 Monitors for signs  and symptons of infection                              Entry 1                                                                                                           Skin Integrity            Intact    Last Modified By:         Ward, RN, Aggie Cosierheresa                              06/23/16 11:12:33    Post-Care Text:            E.10 Evaluates for signs and symptoms of physical injury to skin and tissue E.270 Evaluate tissue perfusion           O.60 Patient is free from signs and symptoms of injury caused by extraneous objects   O.210 Patinet's tissue           perfusion is consistent with or improved from baseline levels      SFEND - Procedure Time Out                                                                      Pre-Care Text:            A.10 Confirms patient identity A.20 Verifies operative procedure, surgical site, and laterality A.20.1 Verifies           consent for planned procedure A.30 Verifies allergies                              Entry 1                                                                                                          Surgical/Procedure        Yes                             Time Out Complete               06/23/16 11:13:00  Team confirms     correct patient,     correct site and     correct procedure     Last Modified By:         Ward, RN, Aggie Cosier                              06/23/16 11:13:41    Post-Care Text:            E.30 Evaluates verification process for correct patient, site, side, and level surgery      SFEND - General Case Data                                                                                                 Entry 1                                                                                                          Case Information      ASA Class                3                               Case Level                      Level 1     OR                       SF Endo 01                      Specialty                       Gastroenterology (SN)     Wound Class              No Incision    Preop Diagnosis           NAUSEA / VOMITING /                              CONSTIPATION / HX OF                               COLON POLYPS    Last Modified By:         Ward, RN, Aggie Cosier  06/23/16 11:13:47      SFEND - Procedures                                                                                                        Entry 1                         Entry 2                         Entry 3                                          Procedure     Description      Procedure                Esophagastroduodenoscopy        Colonoscopy                     Esophagastroduodenoscopy                                                                                               with Biopsy / Brush     Modifiers      Surgical Procedure       EGD                             COLON                           EGD     Text     Primary Procedure         Yes                             No                              No    Primary Surgeon           GOODEAR-MD,  Gilford Silvius C    Start                     06/23/16 11:16:00  06/23/16 11:16:00               06/23/16 11:16:00    Stop                      06/23/16 11:44:00               06/23/16 11:44:00               06/23/16 11:44:00    Anesthesia Type           Monitored Anesthesia            Monitored Anesthesia            Monitored Anesthesia                              Care                            Care                            Care    Surgical Service          Gastroenterology (SN)           Gastroenterology (SN)           Gastroenterology (SN)    Wound Class               No Incision                     No Incision                     No Incision    Last Modified By:         Ward, RN, Carmell Austria, RN, Carmell Austria, RN, [facility]                              06/23/16 11:47:34               06/23/16 11:47:34               06/23/16 11:47:34                                Entry 4                                                                                                           Procedure     Description      Procedure                Colonoscopy with Polyp  Cold     Modifiers      Surgical Procedure       COLON     Text     Primary Procedure         No    Primary Surgeon           Daun Peacock    Start                     06/23/16 11:16:00    Stop                      06/23/16 11:44:00    Anesthesia Type           Monitored Anesthesia                              Care    Surgical Service          Gastroenterology (SN)    Wound Class               No Incision    Last Modified By:         Elesa Massed, RN, Theresa                              06/23/16 11:47:34      SFEND - Procedures Audit                                                                         06/23/16 11:47:34         Owner: Gillian Shields                               Modifier: OWENST                                                        <+> 1         Stop        <+> 2         Stop        <+> 3         Stop        <+> 4         Stop     06/23/16 11:30:36         Owner: Gillian Shields                               Modifier: OWENST                                                        <+> 4  Procedure        <+> 4         Primary Procedure        <+> 4         Primary Surgeon        <+> 4         Surgical Service        <+> 4         Start        <+> 4         Wound Class        <+> 4         Anesthesia Type        <+> 4         Surgical Procedure Text        SFEND - Carbon Dioxide Insufflation                                                                                       Entry 1                                                                                                          Procedure                 Colonoscopy,                    Carbon Dioxide                  REGULATOR CO2 ON WALL                              Esophagastroduodenoscopy        Insufflator                                with Biopsy / B,                              Colonoscopy with  Polyp                              Cold    Insufflation Mode         Managed Flow                    Flow Rate                       5 L/min    Last Modified  ByElesa Massed, RN, Theresa                              06/23/16 11:30:44      SFEND - Carbon Dioxide Insufflation Audit                                                        06/23/16 11:30:44         Owner: Gillian Shields                               Modifier: OWENST                                                            1     <*> Procedure                              Colonoscopy, Esophagastroduodenoscopy with Biopsy / B     06/23/16 11:25:15         Owner: Gillian Shields                               Modifier: OWENST                                                            1     <*> Procedure                              Colonoscopy        SFEND - Lower Endoscopy Detail                                                                                            Entry 1  Colonoscopy     Completion Details      Cecum Reached            Yes                             Cecum Reached                   06/23/16 11:27:00     Withdrawal Time          06/23/16 11:44:00    Hemorrhoid     Treatment Details     Last Modified By:         Ward, RN, Theresa                              06/23/16 11:44:44      SFEND - Lower Endoscopy Detail Audit                                                             06/23/16 11:44:44         Owner: Gillian Shields                               Modifier: OWENST                                                        <+> 1         Withdrawal Time     06/23/16 11:30:24         Owner: Gillian Shields                               Modifier: OWENST                                                        <+> 1         Cecum Reached        SFEND - Transfer                                                                                                          Entry 1  Via                       Stretcher                       Post-op Destination             ACU    Skin Assessment      Condition                Intact    Last Modified By:         Ward, RN, Aggie Cosier                              06/23/16 11:11:20      SFEND - Specimens                                                                               Pre-Care Text:            A.20 Verifies operative procedure, surgical site, and laterality Im.320 Manages culture specimen collection           Im.330 Manages specimen handling and disposition                              Entry 1                         Entry 2                         Entry 3                                          Description               a. gastric remnant bxs          b. ascending colon              c. transverse colon                                                              polyps                          polyps    Specimen Type             Routine                         Routine                         Routine    Date/Time in  Formalin (for     breast tissue only)     Last Modified By:         Ward, RN, Carmell Austria, RN, Carmell Austria, RN, [facility]                              06/23/16 11:25:43               06/23/16 11:31:45               06/23/16 11:40:36                                Entry 4                         Entry 5                                                                          Description               d. descending colon             e. sigmoid colon polyp                              polyps    Specimen Type             Routine                         Routine    Date/Time in     Formalin (for     breast tissue only)     Last Modified By:         Ward, RN, Carmell Austria, RN, Aggie Cosier                              06/23/16 11:41:03               06/23/16 11:43:59    Post-Care Text:             E.40 Evaluates correct processes have been performed for specimen handling and disposition O.40 Patient's           specimen(s) is managed in the appropriate manner      SFEND - Specimens Audit                                                                          06/23/16 11:43:59  Owner: Gillian Shields                               Modifier: OWENST                                                        <+> 5         Description        <+> 5         Specimen Type     06/23/16 11:41:03         Owner: Gillian Shields                               Modifier: OWENST                                                        <+> 4         Description        <+> 4         Specimen Type     06/23/16 11:40:36         Owner: Gillian Shields                               Modifier: OWENST                                                        <+> 3         Description        <+> 3         Specimen Type     06/23/16 11:31:45         Owner: Gillian Shields                               Modifier: OWENST                                                        <+> 2         Description        <+> 2         Specimen Type        Case Comments                                                                                         <  None>              Finalized By: Elesa Massed, RN, Aggie Cosier      Document Signatures                                                                             Signed By:           Elesa Massed, RN, Theresa 06/23/16 11:47

## 2016-06-23 NOTE — Discharge Summary (Signed)
Inpatient Patient Summary               [facility]  [address]  [place], [place] [address]  [phone]  Patient Discharge Instructions     Name: Jimmy Perkins, Jimmy Perkins  Current Date: 06/23/2016 12:01:03  DOB: May 10, 1961 MRN: [id number] FIN: [id number]  Patient Address: [address] [place] [facility] [address]  Patient Phone: [phone]  Primary Care Provider:  Name: Chip Boer  Phone: [phone]   Immunizations Provided:       Discharge Diagnosis:   Discharged To: TO, ANTICIPATED%>  Home Treatments: TREATMENTS, ANTICIPATED%>  Devices/Equipment: EQUIPMENT REHAB%>  Post Hospital Services: HOSPITAL SERVICES%>  Professional Skilled Services: SKILLED SERVICES%>  Therapist, sports and Community Resources:               SERV AND COMM RES, ANTICIPATED%>  Mode of Discharge Transportation: TRANSPORTATION%>  Discharge Orders         Discharge Patient 06/23/16 11:55:00 EDT, Discharge Home/Self Care         Comment:      Medications   During the course of your visit, your medication list was updated with the most current information. The details of those changes are reflected below:         Medications that have not changed  Other Medications  albuterol (albuterol CFC free 90 mcg/inh inhalation aerosol) 1 Puffs Inhale (breathe in) every 4 hours as needed shortness of breath or wheezing.  Last Dose:____________________  aspirin (aspirin 81 mg oral tablet, chewable) 4 Tabs Chewed every day.  Last Dose:____________________  atorvastatin (atorvastatin 80 mg oral tablet) 1 Tabs Oral (given by mouth) every day.  Last Dose:____________________  baclofen (baclofen 10 mg oral tablet) 1 Tabs Oral (given by mouth) 2 times a day.  Last Dose:____________________  cyanocobalamin (cyanocobalamin 1000 mcg/mL injectable solution) 1,000 Microgram Intramuscular (in a muscle) once a month.  Last Dose:____________________  cycloSPORINE ophthalmic (Restasis 0.05% ophthalmic emulsion) 1 Drops Both eyes every  12 hours.  Last Dose:____________________  fluticasone nasal (fluticasone 50 mcg/inh nasal spray) 2 Sprays Nasal (into the nose) every day.  Last Dose:____________________  montelukast (montelukast 10 mg oral tablet) 1 Tabs Oral (given by mouth) once a day (in the evening).  Last Dose:____________________  multivitamin with minerals (Celebrate Multivitamin) 1 Tabs Oral (given by mouth) every day.  Last Dose:____________________  ondansetron (ondansetron 8 mg oral tablet) 1 Tabs Oral (given by mouth) every 6 hours as needed nausea/vomiting.  Last Dose:____________________  promethazine (promethazine 25 mg oral tablet) 1 Tabs Oral (given by mouth) every 6 hours as needed nausea.  Last Dose:____________________  sertraline (sertraline 100 mg oral tablet) 1 Tabs Oral (given by mouth) every day.  Last Dose:____________________  solifenacin (VESIcare 5 mg oral tablet) 1 Tabs Oral (given by mouth) every day.  Last Dose:____________________  tapentadol (Nucynta 75 mg oral tablet) 1 Tabs Oral (given by mouth) every 6 hours.  Last Dose:____________________  traZODone (traZODone 50 mg oral tablet) 1 Tabs Oral (given by mouth) Once a Day (at bedtime).  Last Dose:____________________  zolpidem (zolpidem 10 mg oral tablet) 1 Tabs Oral (given by mouth) Once a Day (at bedtime) as needed., " THIS MEDICATION IS ASSOCIATED WITH AN INCREASED RISK OF FALLS."  Last Dose:____________________  No Longer Take the Following Medications  SUMAtriptan (SUMAtriptan 100 mg oral tablet)   Stop Taking Reason: Physician Request         [facility] would like to thank you for allowing [place] to  assist you with your healthcare needs. The following includes patient education materials and information regarding your injury/illness.     [name], [name] has been given the following list of follow-up instructions, prescriptions, and patient education materials:  Follow-up Instructions:             With: Address: When:   GREGORY GOODEAR-MD [address]  [place], [place]  [id number]  [phone] Business (1)    Comments:   FOLLOW UP WITH DR, Romero BellingGOODEAR AS INSTRUCTED.    CALL OFFICE FOR FOLLOW UP APPOINTMENT.   OFFICE WILL CALL     TWO RX'S:    PANTOPROZOLE 40 MG DAILY   HYCOSAMINE 0.125 MG EVERY 4 HOURS AS NEEDED FOR ABDOMINAL PAIN.       With: Address: When:   [facility] [address]  [place], [place]  [id number]  [phone] Business (1)                     [place]   SL [facility] Sleep [facility] 07/01/2016 19:30:00 07/01/2016 20:30:00 Confirmed              It is important to always keep an active list of medications available so that you can share with other providers and manage your medications appropriately. As an additional courtesy, we are also providing you with your final active medications list that you can keep with you.           albuterol (albuterol CFC free 90 mcg/inh inhalation aerosol) 1 Puffs Inhale (breathe in) every 4 hours as needed shortness of breath or wheezing.  aspirin (aspirin 81 mg oral tablet, chewable) 4 Tabs Chewed every day.  atorvastatin (atorvastatin 80 mg oral tablet) 1 Tabs Oral (given by mouth) every day.  baclofen (baclofen 10 mg oral tablet) 1 Tabs Oral (given by mouth) 2 times a day.  cyanocobalamin (cyanocobalamin 1000 mcg/mL injectable solution) 1,000 Microgram Intramuscular (in a muscle) once a month.  cycloSPORINE ophthalmic (Restasis 0.05% ophthalmic emulsion) 1 Drops Both eyes every 12 hours.  fluticasone nasal (fluticasone 50 mcg/inh nasal spray) 2 Sprays Nasal (into the nose) every day.  montelukast (montelukast 10 mg oral tablet) 1 Tabs Oral (given by mouth) once a day (in the evening).  multivitamin with minerals (Celebrate Multivitamin) 1 Tabs Oral (given by mouth) every day.  ondansetron (ondansetron 8 mg oral tablet) 1 Tabs Oral (given by mouth) every 6 hours as needed nausea/vomiting.  promethazine (promethazine 25 mg oral tablet) 1 Tabs Oral  (given by mouth) every 6 hours as needed nausea.  sertraline (sertraline 100 mg oral tablet) 1 Tabs Oral (given by mouth) every day.  solifenacin (VESIcare 5 mg oral tablet) 1 Tabs Oral (given by mouth) every day.  tapentadol (Nucynta 75 mg oral tablet) 1 Tabs Oral (given by mouth) every 6 hours.  traZODone (traZODone 50 mg oral tablet) 1 Tabs Oral (given by mouth) Once a Day (at bedtime).  zolpidem (zolpidem 10 mg oral tablet) 1 Tabs Oral (given by mouth) Once a Day (at bedtime) as needed., " THIS MEDICATION IS ASSOCIATED WITH AN INCREASED RISK OF FALLS."      Take only the medications listed above. Contact your doctor prior to taking any medications not on this list.        Discharge instructions, if any, will display below     Instructions for Diet: INSTRUCTIONS FOR DIET%>A Healthy Diet, As Tolerated, Regular  diet   Instructions for Supplements: SUPPLEMENT INSTRUCTIONS%>   Instructions for Activity: INSTRUCTIONS FOR ACTIVITY%>As Tolerated   Instructions for Wound Care: INSTRUCTIONS FOR WOUND CARE%>     Medication leaflets, if any, will display below     Patient education materials, if any, will display below        Colonoscopy       A camera attached to a flexible tube with a viewing lens is used to take video pictures.    Colonoscopy is a test to view the inside of your lower digestive tract (colon and rectum). Sometimes it can show the last part of the small intestine (ileum). During the test, small pieces of tissue may be removed for testing. This is called a biopsy. Small growths, such as polyps, may also be removed.    Why is colonoscopy done?   The test is done to help look for colon cancer. And it can help find the source of abdominal pain, bleeding, and changes in bowel habits. It may be needed once a year, depending on factors such as your:    Age    Health history    Family health history    Symptoms    Results from any prior colonoscopy   Risks and possible complications   These include:     Bleeding                 A puncture or tear in the colon     Risks of anesthesia    A cancer lesion not being seen   Getting ready    To prepare for the test:    Talk with your healthcare provider about the risks of the test (see below). Also ask your healthcare provider about alternatives to the test.    Tell your healthcare provider about any medicines you take. Also tell him or her about any health conditions you may have.    Make sure your rectum and colon are empty for the test. Follow the diet and bowel prep instructions exactly. If you dont, the test may need to be rescheduled.    Plan for a friend or family member to drive you home after the test.       Colonoscopy provides an inside view of the entire colon.    You may discuss the results with your doctor right away or at a future visit.   During the test    The test is usually done in the hospital on an outpatient basis. This means you go home the same day. The procedure takes about 30 minutes. During that time:    You are given relaxing (sedating) medicine through an IV line. You may be drowsy, or fully asleep.    The healthcare provider will first give you a physical exam to check for anal and rectal problems.    Then the anus is lubricated and the scope inserted.    If you are awake, you may have a feeling similar to needing to have a bowel movement. You may also feel pressure as air is pumped into the colon. Its OK to pass gas during the procedure.    Biopsy, polyp removal, or other treatments may be done during the test.   After the test    You may have gas right after the test. It can help to try to pass it to help prevent later bloating. Your healthcare provider may discuss the results with you right away. Or you may need to  schedule a follow-up visit to talk about the results. After the test, you can go back to your normal eating and other activities. You may be tired from the sedation and need to rest for a few hours.      2000-2017  [employer] [address], [place], [place] [address]. All rights reserved. This information is not intended as a substitute for professional medical care. Always follow your healthcare professional's instructions.         Upper GI Endoscopy       During endoscopy, a long, flexible tube is used to view the inside of your upper GI tract.     Upper GI endoscopy allows your healthcare provider to look directly into the beginning of your gastrointestinal (GI) tract. The esophagus, stomach, and duodenum (the first part of the small intestine) make up the upper GI tract.    Before the exam   Follow these and any other instructions you are given before your endoscopy. If you dont follow the healthcare providers instructions carefully, the test may need to be canceled or done over:    Don't eat or drink anything after midnight the night before your exam. If your exam is in the afternoon, drink only clear liquids in the morning. Don't eat or drink anything for 8 hours before the exam. In some cases, you may be able to take medicines with sips of water until 2 hours before the procedure. Speak with your healthcare provider about this.     Bring your X-rays and any other test results you have.    Because you will be sedated, arrange for an adult to drive you home after the exam.    Tell your healthcare provider before the exam if you are taking any medicines or have any medical problems.   The procedure   Here is what to expect:    You will lie on the endoscopy table. Usually patients lie on the left side.    You will be monitored and given oxygen.    Your throat may be numbed with a spray or gargle. You are given medicine through an intravenous (IV) line that will help you relax and remain comfortable. You may be awake or asleep during the procedure.    The healthcare provider will put the endoscope in your mouth and down your esophagus. It is thinner than most pieces of food that you swallow. It will  not affect your breathing. The medicine helps keep you from gagging.    Air is put into your GI tract to expand it. It can make you burp.    During the procedure, the healthcare provider can take biopsies (tissue samples), remove abnormalities, such as polyps, or treat abnormalities through a variety of devices placed through the endoscope. You will not feel this.     The endoscope carries images of your upper GI tract to a video screen. If you are awake, you may be able to look at the images.    After the procedure is done, you will rest for a time. An adult must drive you home.   When to call your healthcare provider   Contact your healthcare provider if you have:    Black or tarry stools, or blood in your stool    Fever    Pain in your belly that does not go away    Nausea and vomiting, or vomiting blood      2000-2017 [employer] [place]. [address]  [address], [place], [place] [address]. All rights reserved. This information is not intended as a substitute for professional medical care. Always follow your healthcare professional's instructions.            IS IT A STROKE? Act FAST and Check for these signs:    FACE                         Does the face look uneven?    ARM                         Does one arm drift down?    SPEECH                    Does their speech sound strange?    TIME                         Call 9-1-1 at any sign of stroke  ---------------------------------------------------------------------------------------------------------------------------  Heart Attack Signs  Chest discomfort: Most heart attacks involve discomfort in the center of the chest and lasts more than a few minutes, or goes away and comes back. It can feel like uncomfortable pressure, squeezing, fullness or pain.  Discomfort in upper body: Symptoms can include pain or discomfort in one or both arms, back, neck, jaw or stomach.  Shortness of breath: With or without discomfort.  Other signs: Breaking out in a cold  sweat, nausea, or lightheaded.  Remember, MINUTES DO MATTER. If you experience any of these heart attack warning signs, call 9-1-1 to get immediate medical attention!     ---------------------------------------------------------------------------------------------------------------------------  [facility] allows you to manage your health, view your test results, and retrieve your discharge documents from your hospital stay securely and conveniently from your computer.  To begin the enrollment process, visit [email or web address] Click on "Sign up now" under [facility].   Yes - Patient/Family/Caregiver demonstrates understanding of instructions given        ______________________________                                 ___________________    Patient/Family/ Caregiver Signature                                                           Date/Time     ______________________________                                 ___________________    Provider Signature                                                                                         Date/Time

## 2016-06-23 NOTE — Op Note (Signed)
Procedure Report    [facility] LoudFrancis  Council Munguia, MD  Service Date: 06/23/2016    PROCEDURE PERFORMED:  EGD with biopsy and colonoscopy with biopsy and  snare.    INDICATION:  Abdominal pain and personal history of colon polyps.    CONSENT:  Obtained from the patient.    COMPLICATIONS:  None.    MEDICATIONS:  Please see anesthesia's notes.    SPECIMENS REMOVED:  Gastric biopsy and colon polyps.    ESTIMATED BLOOD LOSS:  Less than 5 mL.    DESCRIPTION OF PROCEDURE:  The patient was sedated and placed in the  left lateral decubitus position.  Endoscope was advanced easily  through the oropharynx into the jejunum.  The endoscope was withdrawn  with retroflexion being performed in the gastric remnant.  All mucosal  surfaces were evaluated.    The patient's bed was rotated and digital rectal exam was performed  which was normal.  The endoscope was advanced easily through the anus  into the terminal ileum.  Endoscope was withdrawn with retroflexion  being performed in the rectum and all mucosal surfaces were evaluated.    FINDINGS:  1.  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  2.  Normal jejunum.  3.  Normal anastomosis without evidence of ulcers.  4.  There is mild gastritis in the gastric remnant.  5.  Normal esophagus.  6.  There were 2 polyps removed from the ascending colon, transverse  colon, descending colon and 1 polyp removed from the sigmoid colon.  A  total of 7 polyps removed with biopsy and cold snare.  The sizes range  from 2 mm to 4 mm.  7.  Diverticulosis.  8.  Small grade I internal hemorrhoids.    IMPRESSION AND PLAN:  1.  Abdominal pain: He is likely having abdominal pain from the  gastritis.  We will start on   Pantoprazole 40 mg daily.  He also uses  Levsin as needed for abdominal cramping and we will refill that.  2.  Colon polyps:  He had 7 total polyps removed, we will review  pathology.  Colonoscopy will be due in 3 years.  3.  The patient will follow up with me in 6  weeks.      Vic RipperGregory  Vantasia Pinkney, MD  TR: *n DD: 06/23/2016 11:53 TD: 06/23/2016 12:32 Job#: [id number]  \[id number]\DOC#: [id number]  \[id number]\

## 2016-06-23 NOTE — Nursing Note (Signed)
Nursing Discharge Summary - Text       Physician Discharge Summary Entered On:  06/23/2016 12:00 EDT    Performed On:  06/23/2016 12:00 EDT by Collier SalinaFlowers, RN, Lesle ChrisKaren M               DC Information   Provider Instructions for Diet :   A Healthy Diet, As Tolerated, Regular diet   Provider Instructions for Activity :   As Tolerated   Flowers, RN, Lesle ChrisKaren M - 06/23/2016 12:00 EDT

## 2016-06-23 NOTE — Discharge Summary (Signed)
Inpatient Clinical Summary             [facility]  Post-Acute Care Transfer Instructions  PERSON INFORMATION   Name: Jimmy Perkins, Jimmy Perkins  MRN: [id number]    FIN#: [phone]   PHYSICIANS  Admitting Physician: Daun PeacockGOODEAR-MD,  GREGORY C  Attending Physician: Daun PeacockGOODEAR-MD,  GREGORY C   PCP: Lacy DuverneyMACLEOD-FNP,  ALLISON E  Discharge Diagnosis:    Comment:       PATIENT EDUCATION INFORMATION  Instructions:             Colonoscopy; Upper GI Endoscopy  Medication Leaflets:               Follow-up:                          With: Address: When:   GREGORY GOODEAR-MD [address]  [place], [place]  [id number]  [phone] Business (1)    Comments:   FOLLOW UP WITH DR, Romero BellingGOODEAR AS INSTRUCTED.    CALL OFFICE FOR FOLLOW UP APPOINTMENT.   OFFICE WILL CALL     TWO RX'S:    PANTOPROZOLE 40 MG DAILY   HYCOSAMINE 0.125 MG EVERY 4 HOURS AS NEEDED FOR ABDOMINAL PAIN.       With: Address: When:   [facility] [phone]  [place], [place]  [id number]  [phone] Business (1)                                  Type Location Start [place]   [facility] 07/01/2016 19:30:00 07/01/2016 20:30:00 Confirmed          MEDICATION LIST  Medication Reconciliation at Discharge:         Medications that have not changed  Other Medications  albuterol (albuterol CFC free 90 mcg/inh inhalation aerosol) 1 Puffs Inhale (breathe in) every 4 hours as needed shortness of breath or wheezing.  Last Dose:____________________  aspirin (aspirin 81 mg oral tablet, chewable) 4 Tabs Chewed every day.  Last Dose:____________________  atorvastatin (atorvastatin 80 mg oral tablet) 1 Tabs Oral (given by mouth) every day.  Last Dose:____________________  baclofen (baclofen 10 mg oral tablet) 1 Tabs Oral (given by mouth) 2 times a day.  Last Dose:____________________  cyanocobalamin (cyanocobalamin 1000 mcg/mL injectable solution) 1,000 Microgram Intramuscular (in a muscle) once a month.  Last  Dose:____________________  cycloSPORINE ophthalmic (Restasis 0.05% ophthalmic emulsion) 1 Drops Both eyes every 12 hours.  Last Dose:____________________  fluticasone nasal (fluticasone 50 mcg/inh nasal spray) 2 Sprays Nasal (into the nose) every day.  Last Dose:____________________  montelukast (montelukast 10 mg oral tablet) 1 Tabs Oral (given by mouth) once a day (in the evening).  Last Dose:____________________  multivitamin with minerals (Celebrate Multivitamin) 1 Tabs Oral (given by mouth) every day.  Last Dose:____________________  ondansetron (ondansetron 8 mg oral tablet) 1 Tabs Oral (given by mouth) every 6 hours as needed nausea/vomiting.  Last Dose:____________________  promethazine (promethazine 25 mg oral tablet) 1 Tabs Oral (given by mouth) every 6 hours as needed nausea.  Last Dose:____________________  sertraline (sertraline 100 mg oral tablet) 1 Tabs Oral (given by mouth) every day.  Last Dose:____________________  solifenacin (VESIcare 5 mg oral tablet) 1 Tabs Oral (given by mouth) every day.  Last Dose:____________________  tapentadol (Nucynta 75 mg oral tablet) 1 Tabs Oral (given by mouth) every  6 hours.  Last Dose:____________________  traZODone (traZODone 50 mg oral tablet) 1 Tabs Oral (given by mouth) Once a Day (at bedtime).  Last Dose:____________________  zolpidem (zolpidem 10 mg oral tablet) 1 Tabs Oral (given by mouth) Once a Day (at bedtime) as needed., " THIS MEDICATION IS ASSOCIATED WITH AN INCREASED RISK OF FALLS."  Last Dose:____________________  No Longer Take the Following Medications  SUMAtriptan (SUMAtriptan 100 mg oral tablet)   Stop Taking Reason: Physician Request         Patient's Final Home Medication List Upon Discharge:          albuterol (albuterol CFC free 90 mcg/inh inhalation aerosol) 1 Puffs Inhale (breathe in) every 4 hours as needed shortness of breath or wheezing.  aspirin (aspirin 81 mg oral tablet, chewable) 4 Tabs Chewed every day.  atorvastatin (atorvastatin  80 mg oral tablet) 1 Tabs Oral (given by mouth) every day.  baclofen (baclofen 10 mg oral tablet) 1 Tabs Oral (given by mouth) 2 times a day.  cyanocobalamin (cyanocobalamin 1000 mcg/mL injectable solution) 1,000 Microgram Intramuscular (in a muscle) once a month.  cycloSPORINE ophthalmic (Restasis 0.05% ophthalmic emulsion) 1 Drops Both eyes every 12 hours.  fluticasone nasal (fluticasone 50 mcg/inh nasal spray) 2 Sprays Nasal (into the nose) every day.  montelukast (montelukast 10 mg oral tablet) 1 Tabs Oral (given by mouth) once a day (in the evening).  multivitamin with minerals (Celebrate Multivitamin) 1 Tabs Oral (given by mouth) every day.  ondansetron (ondansetron 8 mg oral tablet) 1 Tabs Oral (given by mouth) every 6 hours as needed nausea/vomiting.  promethazine (promethazine 25 mg oral tablet) 1 Tabs Oral (given by mouth) every 6 hours as needed nausea.  sertraline (sertraline 100 mg oral tablet) 1 Tabs Oral (given by mouth) every day.  solifenacin (VESIcare 5 mg oral tablet) 1 Tabs Oral (given by mouth) every day.  tapentadol (Nucynta 75 mg oral tablet) 1 Tabs Oral (given by mouth) every 6 hours.  traZODone (traZODone 50 mg oral tablet) 1 Tabs Oral (given by mouth) Once a Day (at bedtime).  zolpidem (zolpidem 10 mg oral tablet) 1 Tabs Oral (given by mouth) Once a Day (at bedtime) as needed., " THIS MEDICATION IS ASSOCIATED WITH AN INCREASED RISK OF FALLS."         Comment:       ORDERS         Order Name Order Details   Discharge Patient 06/23/16 11:55:00 EDT, Discharge Home/Self Care

## 2017-04-17 ENCOUNTER — Emergency Department (HOSPITAL_COMMUNITY): Payer: Medicare Other

## 2017-04-17 ENCOUNTER — Emergency Department (HOSPITAL_COMMUNITY)
Admission: EM | Admit: 2017-04-17 | Discharge: 2017-04-17 | Disposition: A | Discharge status: Home / Self Care | Payer: Medicare Other | Attending: Emergency Medicine | Admitting: Emergency Medicine

## 2017-04-17 ENCOUNTER — Encounter (HOSPITAL_COMMUNITY): Payer: Self-pay | Admitting: Emergency Medicine

## 2017-04-17 DIAGNOSIS — R51 Headache: Secondary | ICD-10-CM | POA: Diagnosis not present

## 2017-04-17 DIAGNOSIS — M26621 Arthralgia of right temporomandibular joint: Secondary | ICD-10-CM | POA: Diagnosis not present

## 2017-04-17 DIAGNOSIS — R6884 Jaw pain: Secondary | ICD-10-CM | POA: Diagnosis present

## 2017-04-17 IMAGING — CT CT MAXILLOFACIAL W/O CM
3 series · 16 of 47 positions shown, 19 images · non-contrast
Comparison: None.

CLINICAL DATA: Right jaw pain 2 weeks. Feels dislocated. No injury.

EXAM:
CT MAXILLOFACIAL WITHOUT CONTRAST
TECHNIQUE: Multidetector CT imaging of the maxillofacial structures was
performed. Multiplanar CT image reconstructions were also generated.

[Series 3: facialbone 2.0 st · axial · 0.34mm/px · z∈[-122,+22]mm · 10 of 84 slices shown, 13 images]
[im 6/84  brain]
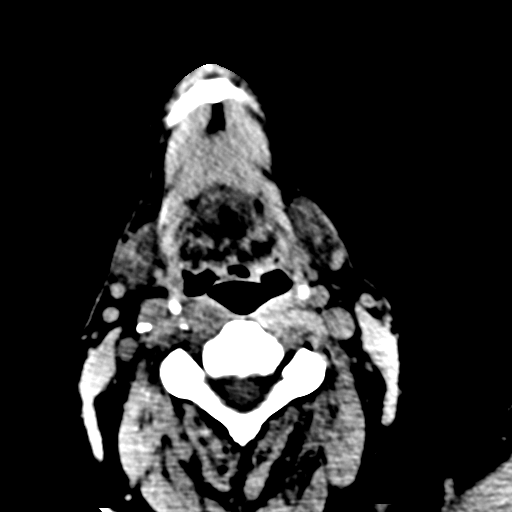
[im 6/84  bone]
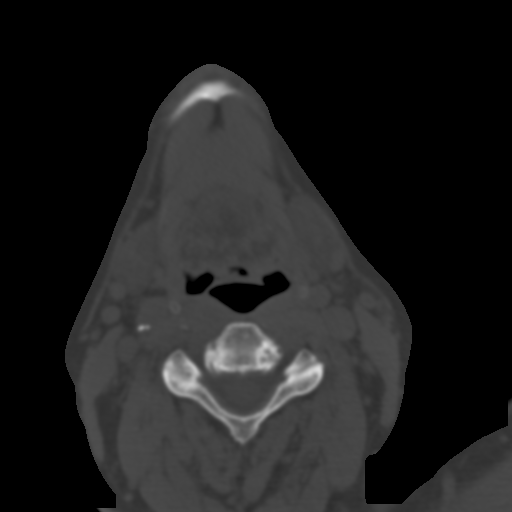
[im 15/84  bone]
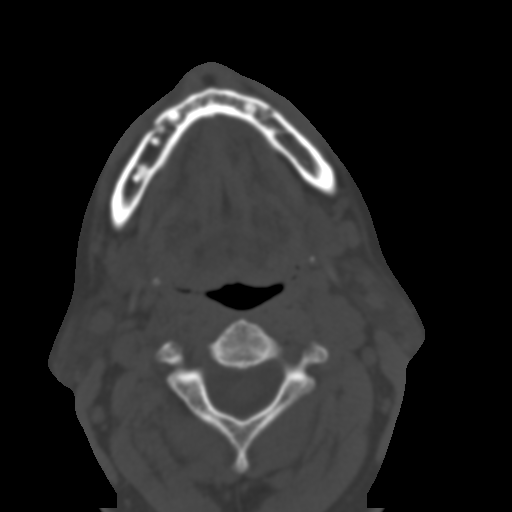
[im 23/84  bone]
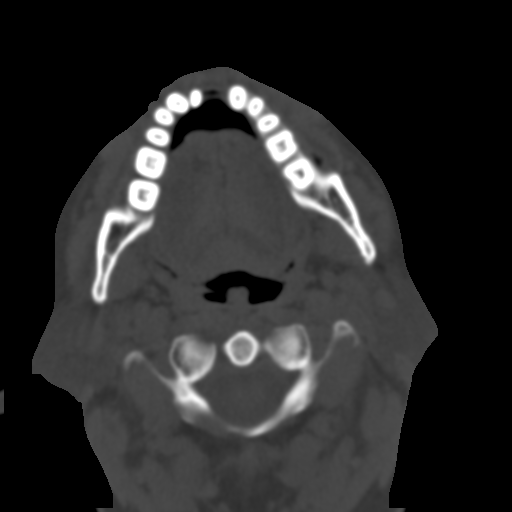
[im 29/84  bone]
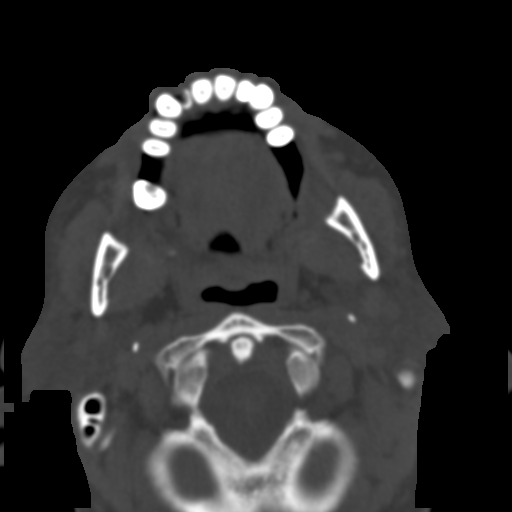
[im 38/84  brain]
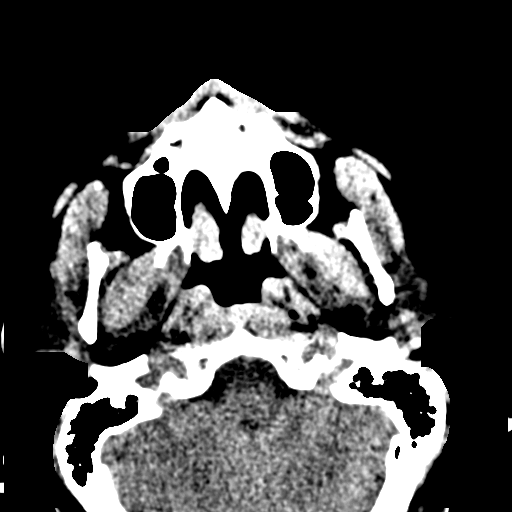
[im 38/84  bone]
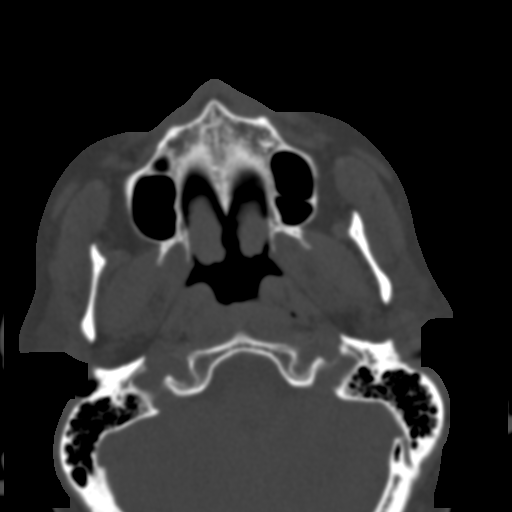
[im 46/84  bone]
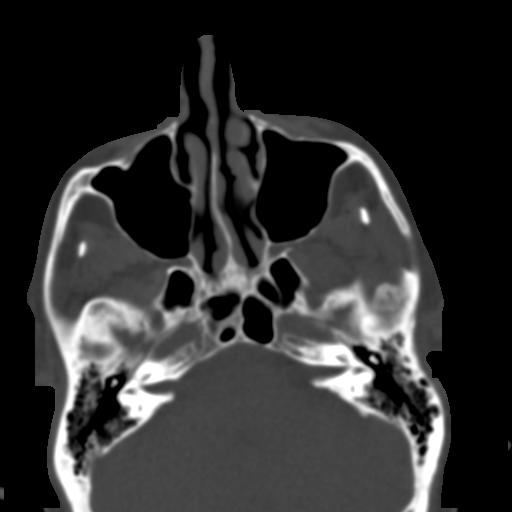
[im 55/84  bone]
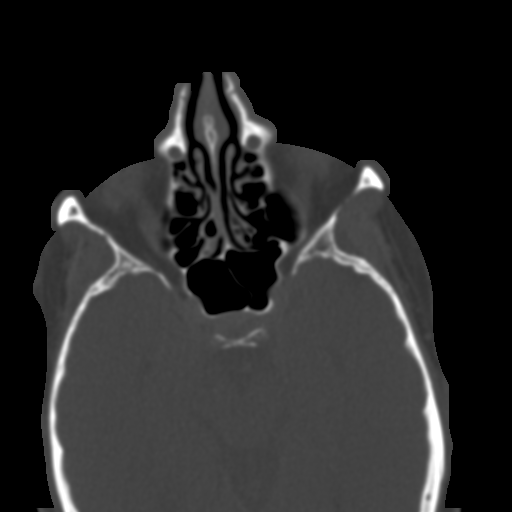
[im 63/84  bone]
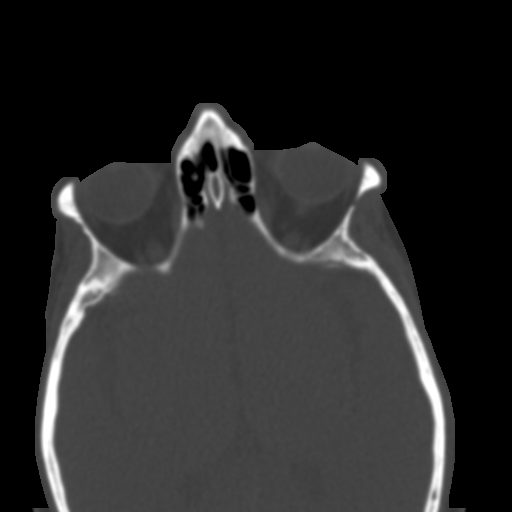
[im 69/84  brain]
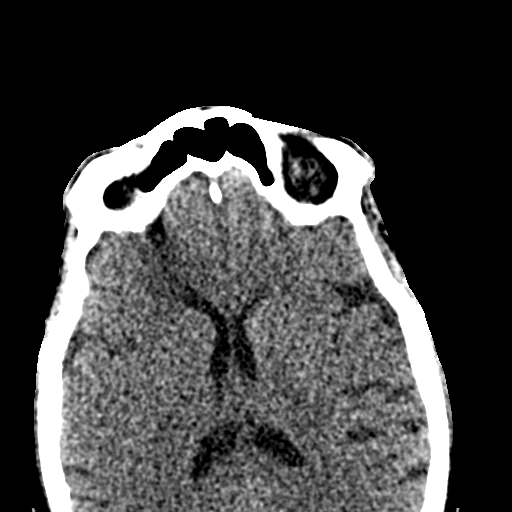
[im 69/84  bone]
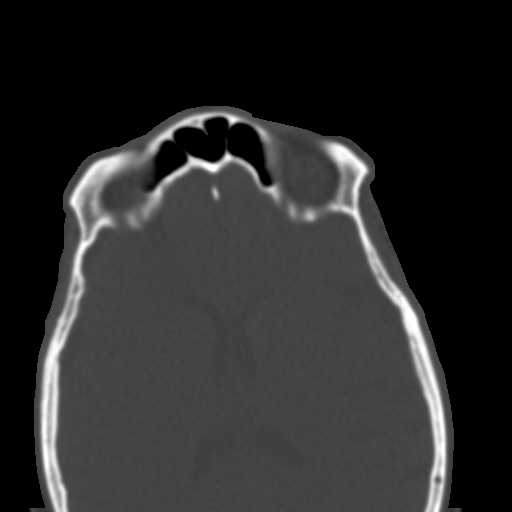
[im 78/84  bone]
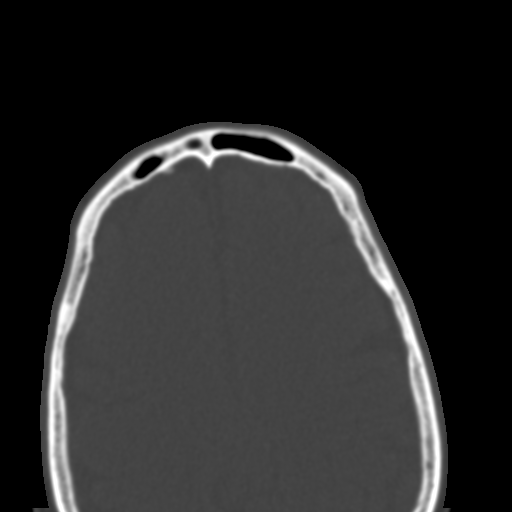

[Series 7: facialbone 2.0 cor st · coronal · 0.34mm/px · 3 of 82 slices shown]
[im 28/82  bone]
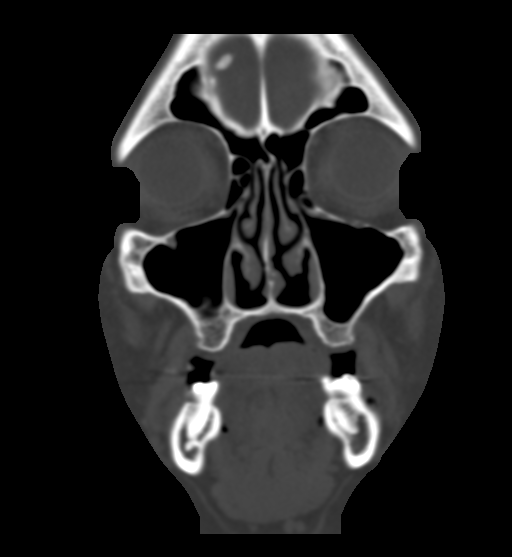
[im 37/82  bone]
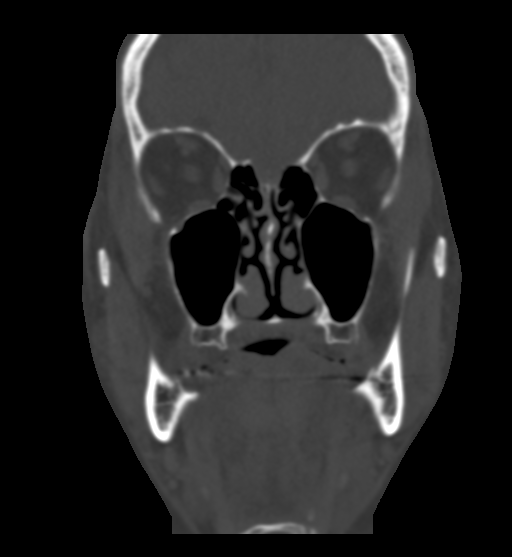
[im 46/82  bone]
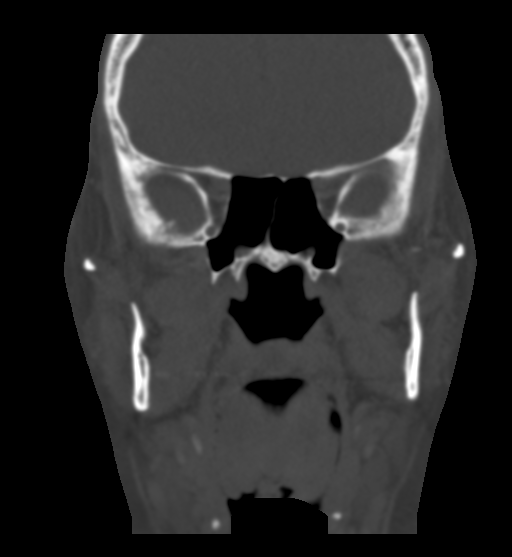

[Series 8: facialbone 2.0 sag st · sagittal · 0.36mm/px · 3 of 76 slices shown]
[im 26/76  bone]
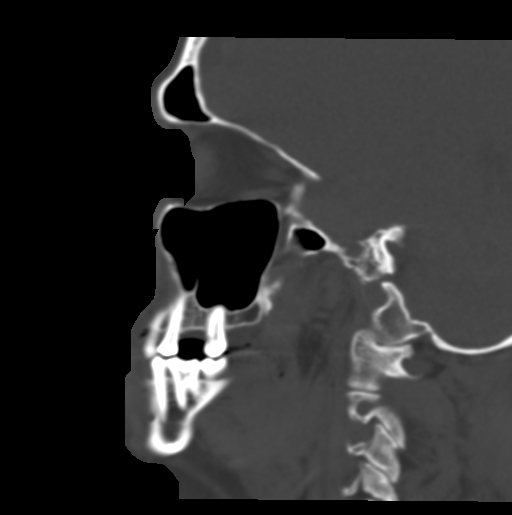
[im 38/76  bone]
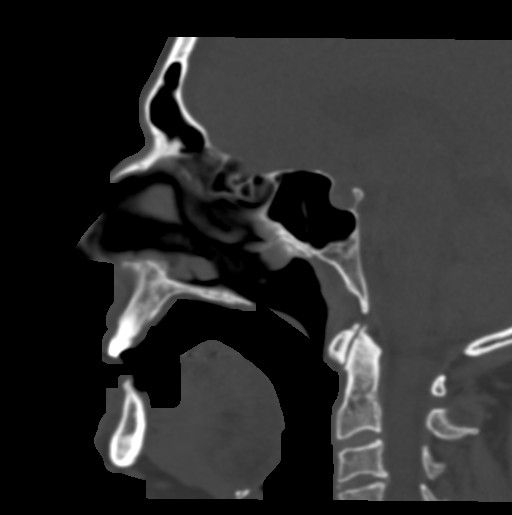
[im 51/76  bone]
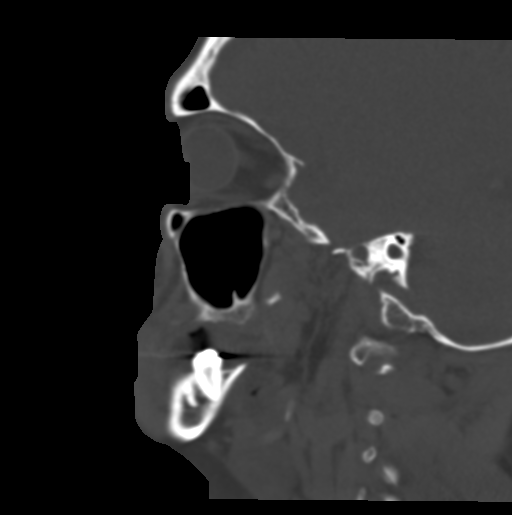

[16 of 47 positions shown; findings below may reference images not displayed]

FINDINGS: Osseous: Negative for fracture in the face.

Advanced degenerative change in the TMJ bilaterally with flattening
of the condyle with spurring. No dislocation or fracture.
Degenerative changes more severe on the left than the right

Orbits: Negative

Sinuses: Negative

Soft tissues: No soft tissue swelling or edema

Limited intracranial: Negative
IMPRESSION: Advanced degenerative change in the TMJ bilaterally. No fracture or
dislocation.

## 2017-04-17 MED ORDER — NAPROXEN 500 MG PO TABS: 500.0000 mg | ORAL_TABLET | Freq: Two times a day (BID) | ORAL | 0 refills | Status: DC

## 2017-04-17 MED ORDER — HYDROCODONE-ACETAMINOPHEN 5-325 MG PO TABS
2.0000 | ORAL_TABLET | Freq: Once | ORAL | Status: AC
  Administered 2017-04-17: 2 via ORAL
  Filled 2017-04-17: qty 2

## 2017-04-17 NOTE — ED Triage Notes (Signed)
Pt sts right side of jaw will not close x 3 weeks; pt sts increased pain and HA; pt sts some dental pain also

## 2017-04-17 NOTE — Discharge Instructions (Signed)
Your jaw pain is likely due to degenerative changes at your temporomandibular joint.  Take Naproxen as needed for pain and follow up with ENT specialist for further management.

## 2017-04-17 NOTE — ED Provider Notes (Signed)
[facility]-EMER[facility]ENCY DEPT Provider Note   CSN: [id number] Arrival date & time: 04/17/17  1201     History   Chief Complaint Chief Complaint  Patient presents with  . Jaw Pain    HPI Ryan Nicholson is a 56 y.o. male.  HPI   56 year old male presenting complaining of jaw discomfort. Patient has had persistent pain to his right jaw ongoing for the past 2 weeks. He feels that his jaundice not lining up normally and he has trouble chewing on the affected side. Pain is now radiating towards right ear and causing him to have headaches. He has tried taking ibuprofen at home with minimal improvement. He denies any recent injury but did recall jamming his fingers 2 weeks ago presents a metal hinge as well as suffering several different paper cuts and within the past few weeks as he has been moving furniture. Patient recently moved here from [place]. He is up-to-date with tetanus. He has history of post thoracic syndrome and does take chronic pain medication including Nucenta and baclofen. He denies any associated fever, chest pain, shortness of breath, nausea, dizziness, lightheadedness, diaphoresis. He denies any throat swelling or trouble swallowing. Patient admits he used to grams teeth asleep in the past but haven't done recently according to his wife. No recent medication changes.  History reviewed. No pertinent past medical history.  There are no active problems to display for this patient.   History reviewed. No pertinent surgical history.     Home Medications    Prior to Admission medications   Not on File    Family History History reviewed. No pertinent family history.  Social History Social History  Substance Use Topics  . Smoking status: Never Smoker  . Smokeless tobacco: Never Used  . Alcohol use No     Allergies   Oxycontin [oxycodone] and Reglan [metoclopramide]   Review of Systems Review of Systems  All other systems reviewed and are  negative.    Physical Exam Updated Vital Signs BP 107/89 (BP Location: Right Arm)   Pulse 89   Temp 97.6 F (36.4 C) (Oral)   Resp 18   SpO2 97%   Physical Exam  Constitutional: He appears well-developed and well-nourished. No distress.  HENT:  Head: Atraumatic.  Ears: Normal TMs bilaterally Nose: Normal nares mouth: Tenderness along right lower jaw at the rami without any facial swelling or signs of jaw dislocation. Able to close mouth completely. Teeth appears aligned. No dental pain or significant dental decay noted.   Eyes: Conjunctivae are normal.  Neck: Neck supple.  No nuchal rigidity  Cardiovascular: Normal rate and regular rhythm.   Pulmonary/Chest: Effort normal and breath sounds normal.  Abdominal: Soft. He exhibits no distension. There is no tenderness.  Neurological: He is alert.  Skin: No rash noted.  Psychiatric: He has a normal mood and affect.  Nursing note and vitals reviewed.    ED Treatments / Results  Labs (all labs ordered are listed, but only abnormal results are displayed) Labs Reviewed - No data to display  EKG  EKG Interpretation None       Radiology Ct Maxillofacial Wo Contrast  Result Date: 04/17/2017 CLINICAL DATA:  Right jaw pain 2 weeks. Feels dislocated. No injury. EXAM: CT MAXILLOFACIAL WITHOUT CONTRAST TECHNIQUE: Multidetector CT imaging of the maxillofacial structures was performed. Multiplanar CT image reconstructions were also generated. COMPARISON:  None. FINDINGS: Osseous: Negative for fracture in the face. 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 No dislocation or fracture. 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 No fracture or dislocation. Electronically Signed   By: Marlan Palau M.D.   On: 04/17/2017 13:51    Procedures Procedures (including critical care time)  Medications Ordered in ED Medications  HYDROcodone-acetaminophen (NORCO/VICODIN) 5-325 MG per tablet 2 tablet (2 tablets Oral Given 04/17/17 1324)     Initial Impression / Assessment and Plan / ED Course  I have reviewed the triage vital signs and the nursing notes.  Pertinent labs & imaging results that were available during my care of the patient were reviewed by me and considered in my medical decision making (see chart for details).     BP 116/79   Pulse 71   Temp 97.6 F (36.4 C) (Oral)   Resp 18   SpO2 96%    Final Clinical Impressions(s) / ED Diagnoses   Final diagnoses:  Arthralgia of right temporomandibular joint    New Prescriptions New Prescriptions   NAPROXEN (NAPROSYN) 500 MG TABLET    Take 1 tablet (500 mg total) by mouth 2 (two) times daily.   12:40 PM Patient with tenderness to the right lower jaw, with sensation that the jaundice not closing appropriately. This is been ongoing issue for the past 2 weeks. Pain is reproducible on exam. However, jaw appears to be located. No chest pain or other symptoms to suggest ACS. He mentioned he is up-to-date with tetanus but didn't suffer several paper cut. He does not have any other tetini symptoms aside from the jaw.  Plan to obtain maxillofacial CT for further evaluation.  Care discussed with DR. Campos.   2:24 PM  CT of maxillofacial demonstrating advanced degenerative change in the TMJ bilaterally without any fractures or dislocation. This finding is consistence with patient's presenting complaint. Patient will be discharge home with NSAIDs, and refer to ENT for further evaluation. Return precaution discussed.   Fayrene Helper, PA-C 04/17/17 1429    Azalia Bilis, MD 04/18/17 [phone]

## 2017-04-17 NOTE — ED Notes (Signed)
Boy climbing on & off of bed. RN instructed male in room not to allow children to be climbing on & off the patient's bed. Verbalized understanding and agreement.

## 2017-04-17 NOTE — ED Notes (Signed)
ED Provider at bedside. 

## 2017-05-18 ENCOUNTER — Ambulatory Visit: Payer: Self-pay | Admitting: Family Medicine

## 2017-06-03 ENCOUNTER — Encounter: Payer: Self-pay | Admitting: Family Medicine

## 2017-06-03 ENCOUNTER — Ambulatory Visit (INDEPENDENT_AMBULATORY_CARE_PROVIDER_SITE_OTHER): Payer: Medicare Other | Admitting: Family Medicine

## 2017-06-03 VITALS — BP 126/78 | HR 88 | Temp 98.4°F | Resp 17 | Ht 67.5 in | Wt 200.0 lb

## 2017-06-03 DIAGNOSIS — Z8673 Personal history of transient ischemic attack (TIA), and cerebral infarction without residual deficits: Secondary | ICD-10-CM | POA: Diagnosis not present

## 2017-06-03 DIAGNOSIS — R110 Nausea: Secondary | ICD-10-CM

## 2017-06-03 DIAGNOSIS — G894 Chronic pain syndrome: Secondary | ICD-10-CM | POA: Diagnosis not present

## 2017-06-03 DIAGNOSIS — M549 Dorsalgia, unspecified: Secondary | ICD-10-CM | POA: Diagnosis not present

## 2017-06-03 DIAGNOSIS — E785 Hyperlipidemia, unspecified: Secondary | ICD-10-CM | POA: Diagnosis not present

## 2017-06-03 DIAGNOSIS — J309 Allergic rhinitis, unspecified: Secondary | ICD-10-CM | POA: Diagnosis not present

## 2017-06-03 DIAGNOSIS — G8912 Acute post-thoracotomy pain: Secondary | ICD-10-CM | POA: Diagnosis not present

## 2017-06-03 DIAGNOSIS — Z9884 Bariatric surgery status: Secondary | ICD-10-CM | POA: Diagnosis not present

## 2017-06-03 DIAGNOSIS — Z87438 Personal history of other diseases of male genital organs: Secondary | ICD-10-CM | POA: Diagnosis not present

## 2017-06-03 DIAGNOSIS — R413 Other amnesia: Secondary | ICD-10-CM | POA: Diagnosis not present

## 2017-06-03 DIAGNOSIS — E538 Deficiency of other specified B group vitamins: Secondary | ICD-10-CM | POA: Diagnosis not present

## 2017-06-03 DIAGNOSIS — Z9049 Acquired absence of other specified parts of digestive tract: Secondary | ICD-10-CM

## 2017-06-03 DIAGNOSIS — G8929 Other chronic pain: Secondary | ICD-10-CM

## 2017-06-03 MED ORDER — ONDANSETRON HCL 8 MG PO TABS: 8.0000 mg | ORAL_TABLET | Freq: Three times a day (TID) | ORAL | 1 refills | Status: DC | PRN

## 2017-06-03 MED ORDER — CYANOCOBALAMIN 1000 MCG/ML IJ SOLN: 1000.0000 ug | INTRAMUSCULAR | 1 refills | Status: DC

## 2017-06-03 MED ORDER — FLUTICASONE PROPIONATE 50 MCG/ACT NA SUSP: 2.0000 | Freq: Every day | NASAL | 11 refills | Status: AC

## 2017-06-03 NOTE — Progress Notes (Signed)
Subjective:  By signing my name below, I, Ryan Nicholson, attest that this documentation has been prepared under the direction and in the presence of Ryan FloodJeffrey R Kayna Suppa, MD Electronically Signed: Charline BillsEssence Nicholson, ED Scribe 06/03/2017 at 11:14 AM.   Patient ID: Ryan Nicholson, male    DOB: April 12, 1961, 56 y.o.   MRN: [id number]  Chief Complaint  Patient presents with  . Establish Care   HPI Ryan NyCraig A Nicholson is a 56 y.o. male who presents [facility] at [facility] to establish care and presents with left knee pain. New pt to me. From [place]. Pt plans to [facility] L knee pain. Pt is not fasting; he has had a few sips of hot chocolate.   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 Pt is followed by Dr. Olevia PerchesGoldberger at [facility].  H/o CVA Secondary prevention with slightly elevated cholesterol. Takes Lipitor 80 mg at bedtime. Reports memory issues prior to CVA but states this worsened following. Denies slurred speech, weakness, facial droop.   H/o Enlarged Prostate Pt had a prostate uro lift done 11/2015. No h/o CA.   Gastric Bypass Gastric bypass surgery in July 2005. Takes 8 mg Zofran PRN (typically 1 daily) and rarely takes 25 mg Phenergan since bypass for chronic nausea. He does not have a gastroenterologist. Sometimes constipated but takes Senokot and stool softener. H/o colon resection in 2008. Pt receives Vitamin B-12 injections at home with the last being 05/28/17. Also reports constant dry mouth since bypass which has resulted in a lot of dental work.   There are no active problems to display for this patient.  No past medical history on file. No past surgical history on file. Allergies  Allergen Reactions  . Oxycontin [Oxycodone] Other (See Comments)    Unknown  . Reglan [Metoclopramide] Other (See Comments)    Unknown   Prior to Admission medications   Medication Sig Start Date  End Date Taking? Authorizing Provider  albuterol (PROVENTIL HFA;VENTOLIN HFA) 108 (90 Base) MCG/ACT inhaler Inhale 2 puffs into the lungs every 4 (four) hours as needed for wheezing or shortness of breath.   Yes [provider]  aspirin 81 MG chewable tablet Chew 81 mg by mouth daily.   Yes [provider]  atorvastatin (LIPITOR) 40 MG tablet Take 80 mg by mouth at bedtime.   Yes [provider]  baclofen (LIORESAL) 10 MG tablet Take 10 mg by mouth 2 (two) times daily.   Yes [provider]  cyanocobalamin (,VITAMIN B-12,) 1000 MCG/ML injection Inject 1,000 mcg into the muscle every 30 (thirty) days.   Yes [provider]  fluticasone (FLONASE) 50 MCG/ACT nasal spray Place 2 sprays into both nostrils daily.   Yes [provider]  montelukast (SINGULAIR) 4 MG PACK Take 4 mg by mouth daily.   Yes [provider]  naproxen (NAPROSYN) 500 MG tablet Take 1 tablet (500 mg total) by mouth 2 (two) times daily. 04/17/17  Yes Fayrene Helperran, [place], PA-C  ondansetron (ZOFRAN) 8 MG tablet Take 8 mg by mouth every 8 (eight) hours as needed for nausea or vomiting.   Yes [provider]  Prenat-FeCbn-FeBisg-FA-Omega (MULTIVITAMIN/MINERALS PO) Take 1 tablet by mouth daily.   Yes [provider]  promethazine (PHENERGAN) 25 MG tablet Take 25 mg by mouth every 6 (six) hours as needed for nausea or vomiting.   Yes [provider]  sertraline (  ZOLOFT) 100 MG tablet Take 100 mg by mouth daily.   Yes [provider]  solifenacin (VESICARE) 5 MG tablet Take 5 mg by mouth at bedtime.   Yes [provider]  tapentadol HCl (NUCYNTA) 75 MG tablet Take 75 mg by mouth 4 (four) times daily.   Yes [provider]  traZODone (DESYREL) 50 MG tablet Take 100 mg by mouth at bedtime.   Yes [provider]   Social History   Social History  . Marital status: Married    Spouse name: N/A  . Number of children: N/A  .  Years of education: N/A   Occupational History  . Not on file.   Social History Main Topics  . Smoking status: Never Smoker  . Smokeless tobacco: Never Used  . Alcohol use No  . Drug use: No  . Sexual activity: Not on file   Other Topics Concern  . Not on file   Social History Narrative  . No narrative on file   Review of Systems  As in HPI.    Objective:   Physical Exam  Constitutional: He is oriented to person, place, and time. He appears well-developed and well-nourished.  HENT:  Head: Normocephalic and atraumatic.  Eyes: Pupils are equal, round, and reactive to light. EOM are normal.  Neck: No JVD present. Carotid bruit is not present.  Cardiovascular: Normal rate, regular rhythm and normal heart sounds.   No murmur heard. Pulmonary/Chest: Effort normal and breath sounds normal. He has no rales.  Musculoskeletal: He exhibits no edema.  Neurological: He is alert and oriented to person, place, and time.  Skin: Skin is warm and dry.  Psychiatric: He has a normal mood and affect.  Vitals reviewed.  Vitals:   06/03/17 1042  BP: 126/78  Pulse: 88  Resp: 17  Temp: 98.4 F (36.9 C)  TempSrc: Oral  SpO2: 98%  Weight: 200 lb (90.7 kg)  Height: 5' 7.5" (1.715 m)      Assessment & Plan:   Ryan Nicholson is a 56 y.o. male Chronic pain syndrome - Plan: Ambulatory referral to Pain Clinic  Chronic back pain, unspecified back location, unspecified back pain laterality - Plan: Ambulatory referral to Pain Clinic Post-thoracotomy pain syndrome - Plan: Ambulatory referral to Pain Clinic  - Currently under treatment, but requests second opinion   History of CVA in adulthood - Plan: Ambulatory referral to Neurology Memory difficulties - Plan: Ambulatory referral to Neurology  -Denies new symptoms. Refer to neuro for ongoing care status post CVA, and to discuss history of memory difficulties  -Secondary prevention with statin, blood pressure control, currently under 130/80.    History of BPH - Plan: Ambulatory referral to Urology  History of gastric bypass - Plan: Vitamin B12 B12 deficiency - Plan: Vitamin B12, cyanocobalamin (,VITAMIN B-12,) 1000 MCG/ML injection  - Check B12, continue B12 injections every month - performs at home.  History of colon resection - Plan: Ambulatory referral to Gastroenterology Chronic nausea - Plan: Comprehensive metabolic panel, ondansetron (ZOFRAN) 8 MG tablet, Ambulatory referral to Gastroenterology  -Agreed to refill Zofran, but recommended discussion with gastroenterology as requiring daily Zofran as well as intermittent Phenergan.  Allergic rhinitis, unspecified seasonality, unspecified trigger - Plan: fluticasone (FLONASE) 50 MCG/ACT nasal spray  -Stable Flonase. Refilled.  Hyperlipidemia, unspecified hyperlipidemia type - Plan: Lipid panel  -Check lipids, continue Lipitor 40 mg daily for secondary prevention after CVA. Ideal goal LDL less than 70. If lipids elevated, may need 8 hours  fast.  Meds ordered this encounter  Medications  . fluticasone (FLONASE) 50 MCG/ACT nasal spray    Sig: Place 2 sprays into both nostrils daily.    Dispense:  16 g    Refill:  11  . ondansetron (ZOFRAN) 8 MG tablet    Sig: Take 1 tablet (8 mg total) by mouth every 8 (eight) hours as needed for nausea or vomiting.    Dispense:  20 tablet    Refill:  1  . cyanocobalamin (,VITAMIN B-12,) 1000 MCG/ML injection    Sig: Inject 1 mL (1,000 mcg total) into the muscle every 30 (thirty) days.    Dispense:  3 mL    Refill:  1   Patient Instructions    Thanks for coming in today. I will refer you to gastroenterology, neurology, urology, and pain management specialist as discussed. I refilled your vitamin B12, Flonase and Zofran. We can discuss your medical history further at next visit and if needed can repeat labs if cholesterol is elevated on today's blood work.   Return to the clinic or go to the nearest emergency room if any of your  symptoms worsen or new symptoms occur.    IF you received an x-ray today, you will receive an invoice from [facility]. Please contact [facility] at [phone] with questions or concerns regarding your invoice.   IF you received labwork today, you will receive an invoice from [place]. Please contact [facility] at [phone] with questions or concerns regarding your invoice.   Our billing staff will not be able to assist you with questions regarding bills from these companies.  You will be contacted with the lab results as soon as they are available. The fastest way to get your results is to activate your My Chart account. Instructions are located on the last page of this paperwork. If you have not heard from [place] regarding the results in 2 weeks, please contact this office.       I personally performed the services described in this documentation, which was scribed in my presence. The recorded information has been reviewed and considered for accuracy and completeness, addended by me as needed, and agree with information above.  Signed,   Meredith Staggers, MD Primary Care at [facility].  06/05/17 2:55 PM

## 2017-06-03 NOTE — Patient Instructions (Addendum)
  Thanks for coming in today. I will refer you to gastroenterology, neurology, urology, and pain management specialist as discussed. I refilled your vitamin B12, Flonase and Zofran. We can discuss your medical history further at next visit and if needed can repeat labs if cholesterol is elevated on today's blood work.   Return to the clinic or go to the nearest emergency room if any of your symptoms worsen or new symptoms occur.    IF you received an x-ray today, you will receive an invoice from [facility]. Please contact [facility] at [phone] with questions or concerns regarding your invoice.   IF you received labwork today, you will receive an invoice from [facility]. Please contact [facility] at [phone] with questions or concerns regarding your invoice.   Our billing staff will not be able to assist you with questions regarding bills from these companies.  You will be contacted with the lab results as soon as they are available. The fastest way to get your results is to activate your My Chart account. Instructions are located on the last page of this paperwork. If you have not heard from us regarding the results in 2 weeks, please contact this office.

## 2017-06-04 LAB — COMPREHENSIVE METABOLIC PANEL
ALBUMIN: 4.3 g/dL (ref 3.5–5.5)
ALT: 14 IU/L (ref 0–44)
AST: 24 IU/L (ref 0–40)
Albumin/Globulin Ratio: 1.9 (ref 1.2–2.2)
Alkaline Phosphatase: 62 IU/L (ref 39–117)
BILIRUBIN TOTAL: 0.4 mg/dL (ref 0.0–1.2)
BUN / CREAT RATIO: 15 (ref 9–20)
BUN: 15 mg/dL (ref 6–24)
CALCIUM: 8.9 mg/dL (ref 8.7–10.2)
CHLORIDE: 101 mmol/L (ref 96–106)
CO2: 25 mmol/L (ref 20–29)
CREATININE: 0.98 mg/dL (ref 0.76–1.27)
GFR, EST AFRICAN AMERICAN: 99 mL/min/{1.73_m2} (ref >59)
GFR, EST NON AFRICAN AMERICAN: 85 mL/min/{1.73_m2} (ref >59)
GLUCOSE: 91 mg/dL (ref 65–99)
Globulin, Total: 2.3 g/dL (ref 1.5–4.5)
Potassium: 4.9 mmol/L (ref 3.5–5.2)
Sodium: 138 mmol/L (ref 134–144)
TOTAL PROTEIN: 6.6 g/dL (ref 6.0–8.5)

## 2017-06-04 LAB — LIPID PANEL
CHOL/HDL RATIO: 2.4 ratio (ref 0.0–5.0)
Cholesterol, Total: 153 mg/dL (ref 100–199)
HDL: 64 mg/dL (ref >39)
LDL CALC: 77 mg/dL (ref 0–99)
Triglycerides: 59 mg/dL (ref 0–149)
VLDL CHOLESTEROL CAL: 12 mg/dL (ref 5–40)

## 2017-06-04 LAB — VITAMIN B12: VITAMIN B 12: 713 pg/mL (ref 232–1245)

## 2017-06-08 ENCOUNTER — Telehealth: Payer: Self-pay | Admitting: Emergency Medicine

## 2017-06-08 NOTE — Telephone Encounter (Signed)
Hi Dr. Neva SeatGreene, Dr. Allena KatzPatel from [facility] called to report patient complaints of ongoing R Temporal headache for months with jaw pain. Not sure if he discussed symptoms during office visit.  States, CT scan revealed R jaw arthritis. Dr .Allena KatzPatel advised patient to f/u with you and she would also like you to order lab work CBC, Sed rate and CRP.

## 2017-06-08 NOTE — Telephone Encounter (Signed)
We did not discuss headache. Please have him return in next few days to discuss further and I can check sed rate, other tests if needed at that time. I do see the maxillofacial CT in July with TMJ degenerative changes. We can discuss if ENT or maxillofacial eval needed.

## 2017-06-09 ENCOUNTER — Telehealth: Payer: Self-pay | Admitting: *Deleted

## 2017-06-09 NOTE — Telephone Encounter (Signed)
Spoke with patient regarding message about his headache and bloodwork per Dr. Paralee CancelGreene's message. He stated that "his wife has appts all next week, and that he will call back to schedule appt.

## 2017-07-04 DIAGNOSIS — H2511 Age-related nuclear cataract, right eye: Secondary | ICD-10-CM | POA: Diagnosis not present

## 2017-07-22 ENCOUNTER — Ambulatory Visit: Payer: Medicare Other | Admitting: Family Medicine

## 2017-07-22 DIAGNOSIS — Z23 Encounter for immunization: Secondary | ICD-10-CM | POA: Diagnosis not present

## 2017-07-22 DIAGNOSIS — J209 Acute bronchitis, unspecified: Secondary | ICD-10-CM | POA: Diagnosis not present

## 2017-07-22 DIAGNOSIS — M25562 Pain in left knee: Secondary | ICD-10-CM | POA: Diagnosis not present

## 2017-07-22 DIAGNOSIS — R05 Cough: Secondary | ICD-10-CM | POA: Diagnosis not present

## 2017-07-22 DIAGNOSIS — R509 Fever, unspecified: Secondary | ICD-10-CM | POA: Diagnosis not present

## 2017-07-22 DIAGNOSIS — J302 Other seasonal allergic rhinitis: Secondary | ICD-10-CM | POA: Diagnosis not present

## 2017-07-22 DIAGNOSIS — Z79899 Other long term (current) drug therapy: Secondary | ICD-10-CM | POA: Diagnosis not present

## 2017-07-29 DIAGNOSIS — J302 Other seasonal allergic rhinitis: Secondary | ICD-10-CM | POA: Diagnosis not present

## 2017-07-29 DIAGNOSIS — J209 Acute bronchitis, unspecified: Secondary | ICD-10-CM | POA: Diagnosis not present

## 2017-07-29 DIAGNOSIS — R05 Cough: Secondary | ICD-10-CM | POA: Diagnosis not present

## 2017-08-02 DIAGNOSIS — H25811 Combined forms of age-related cataract, right eye: Secondary | ICD-10-CM | POA: Diagnosis not present

## 2017-08-02 DIAGNOSIS — H2511 Age-related nuclear cataract, right eye: Secondary | ICD-10-CM | POA: Diagnosis not present

## 2017-08-05 DIAGNOSIS — R6889 Other general symptoms and signs: Secondary | ICD-10-CM | POA: Diagnosis not present

## 2017-08-05 DIAGNOSIS — R509 Fever, unspecified: Secondary | ICD-10-CM | POA: Diagnosis not present

## 2017-08-05 DIAGNOSIS — R05 Cough: Secondary | ICD-10-CM | POA: Diagnosis not present

## 2017-08-10 ENCOUNTER — Ambulatory Visit: Payer: Medicare Other | Admitting: Neurology

## 2017-08-10 ENCOUNTER — Telehealth: Payer: Self-pay | Admitting: Neurology

## 2017-08-10 NOTE — Telephone Encounter (Signed)
This patient canceled same day of appointment for a new patient evaluation.

## 2017-08-11 ENCOUNTER — Encounter: Payer: Self-pay | Admitting: Neurology

## 2017-08-15 DIAGNOSIS — M546 Pain in thoracic spine: Secondary | ICD-10-CM | POA: Diagnosis not present

## 2017-08-15 DIAGNOSIS — Z79899 Other long term (current) drug therapy: Secondary | ICD-10-CM | POA: Diagnosis not present

## 2017-08-15 DIAGNOSIS — M5135 Other intervertebral disc degeneration, thoracolumbar region: Secondary | ICD-10-CM | POA: Diagnosis not present

## 2017-08-15 DIAGNOSIS — G4700 Insomnia, unspecified: Secondary | ICD-10-CM | POA: Diagnosis not present

## 2017-08-22 DIAGNOSIS — G43009 Migraine without aura, not intractable, without status migrainosus: Secondary | ICD-10-CM | POA: Diagnosis not present

## 2017-08-22 DIAGNOSIS — M546 Pain in thoracic spine: Secondary | ICD-10-CM | POA: Diagnosis not present

## 2017-08-22 DIAGNOSIS — M5135 Other intervertebral disc degeneration, thoracolumbar region: Secondary | ICD-10-CM | POA: Diagnosis not present

## 2017-08-30 DIAGNOSIS — G43909 Migraine, unspecified, not intractable, without status migrainosus: Secondary | ICD-10-CM | POA: Diagnosis not present

## 2017-09-13 DIAGNOSIS — G3184 Mild cognitive impairment, so stated: Secondary | ICD-10-CM | POA: Diagnosis not present

## 2017-09-13 DIAGNOSIS — G43909 Migraine, unspecified, not intractable, without status migrainosus: Secondary | ICD-10-CM | POA: Diagnosis not present

## 2017-09-13 DIAGNOSIS — Z8673 Personal history of transient ischemic attack (TIA), and cerebral infarction without residual deficits: Secondary | ICD-10-CM | POA: Diagnosis not present

## 2017-09-14 DIAGNOSIS — Z79899 Other long term (current) drug therapy: Secondary | ICD-10-CM | POA: Diagnosis not present

## 2017-09-14 DIAGNOSIS — M5135 Other intervertebral disc degeneration, thoracolumbar region: Secondary | ICD-10-CM | POA: Diagnosis not present

## 2017-09-14 DIAGNOSIS — M62838 Other muscle spasm: Secondary | ICD-10-CM | POA: Diagnosis not present

## 2017-09-28 DIAGNOSIS — J069 Acute upper respiratory infection, unspecified: Secondary | ICD-10-CM | POA: Diagnosis not present

## 2017-09-28 DIAGNOSIS — R05 Cough: Secondary | ICD-10-CM | POA: Diagnosis not present

## 2017-09-28 DIAGNOSIS — J209 Acute bronchitis, unspecified: Secondary | ICD-10-CM | POA: Diagnosis not present

## 2017-10-04 DIAGNOSIS — M5135 Other intervertebral disc degeneration, thoracolumbar region: Secondary | ICD-10-CM | POA: Diagnosis not present

## 2017-10-04 DIAGNOSIS — J209 Acute bronchitis, unspecified: Secondary | ICD-10-CM | POA: Diagnosis not present

## 2017-10-06 DIAGNOSIS — K259 Gastric ulcer, unspecified as acute or chronic, without hemorrhage or perforation: Secondary | ICD-10-CM | POA: Insufficient documentation

## 2017-10-06 DIAGNOSIS — K219 Gastro-esophageal reflux disease without esophagitis: Secondary | ICD-10-CM | POA: Insufficient documentation

## 2017-10-06 DIAGNOSIS — R200 Anesthesia of skin: Secondary | ICD-10-CM | POA: Insufficient documentation

## 2017-10-06 DIAGNOSIS — J189 Pneumonia, unspecified organism: Secondary | ICD-10-CM | POA: Insufficient documentation

## 2017-10-06 DIAGNOSIS — K8020 Calculus of gallbladder without cholecystitis without obstruction: Secondary | ICD-10-CM | POA: Insufficient documentation

## 2017-10-06 DIAGNOSIS — K589 Irritable bowel syndrome without diarrhea: Secondary | ICD-10-CM | POA: Insufficient documentation

## 2017-10-06 DIAGNOSIS — K529 Noninfective gastroenteritis and colitis, unspecified: Secondary | ICD-10-CM | POA: Insufficient documentation

## 2017-10-06 DIAGNOSIS — Z860100 Personal history of colon polyps, unspecified: Secondary | ICD-10-CM | POA: Insufficient documentation

## 2017-10-06 DIAGNOSIS — G43909 Migraine, unspecified, not intractable, without status migrainosus: Secondary | ICD-10-CM

## 2017-10-06 DIAGNOSIS — R202 Paresthesia of skin: Secondary | ICD-10-CM

## 2017-10-06 DIAGNOSIS — K269 Duodenal ulcer, unspecified as acute or chronic, without hemorrhage or perforation: Secondary | ICD-10-CM

## 2017-10-06 DIAGNOSIS — D649 Anemia, unspecified: Secondary | ICD-10-CM | POA: Insufficient documentation

## 2017-10-06 DIAGNOSIS — G4730 Sleep apnea, unspecified: Secondary | ICD-10-CM | POA: Insufficient documentation

## 2017-10-06 DIAGNOSIS — N200 Calculus of kidney: Secondary | ICD-10-CM | POA: Insufficient documentation

## 2017-10-06 DIAGNOSIS — M549 Dorsalgia, unspecified: Secondary | ICD-10-CM

## 2017-10-06 DIAGNOSIS — N400 Enlarged prostate without lower urinary tract symptoms: Secondary | ICD-10-CM

## 2017-10-07 DIAGNOSIS — R05 Cough: Secondary | ICD-10-CM | POA: Diagnosis not present

## 2017-10-07 DIAGNOSIS — J209 Acute bronchitis, unspecified: Secondary | ICD-10-CM | POA: Diagnosis not present

## 2017-10-07 DIAGNOSIS — Z79899 Other long term (current) drug therapy: Secondary | ICD-10-CM | POA: Diagnosis not present

## 2017-10-14 DIAGNOSIS — R0600 Dyspnea, unspecified: Secondary | ICD-10-CM | POA: Diagnosis not present

## 2017-10-14 DIAGNOSIS — M5135 Other intervertebral disc degeneration, thoracolumbar region: Secondary | ICD-10-CM | POA: Diagnosis not present

## 2017-10-14 DIAGNOSIS — J069 Acute upper respiratory infection, unspecified: Secondary | ICD-10-CM | POA: Diagnosis not present

## 2017-10-14 DIAGNOSIS — Z79899 Other long term (current) drug therapy: Secondary | ICD-10-CM | POA: Diagnosis not present

## 2017-10-14 DIAGNOSIS — R42 Dizziness and giddiness: Secondary | ICD-10-CM | POA: Diagnosis not present

## 2017-10-21 DIAGNOSIS — Z79899 Other long term (current) drug therapy: Secondary | ICD-10-CM | POA: Diagnosis not present

## 2017-10-21 DIAGNOSIS — R0602 Shortness of breath: Secondary | ICD-10-CM | POA: Diagnosis not present

## 2017-10-21 DIAGNOSIS — Z9884 Bariatric surgery status: Secondary | ICD-10-CM | POA: Diagnosis not present

## 2017-10-21 DIAGNOSIS — R05 Cough: Secondary | ICD-10-CM | POA: Diagnosis not present

## 2017-10-26 DIAGNOSIS — R05 Cough: Secondary | ICD-10-CM | POA: Diagnosis not present

## 2018-06-27 ENCOUNTER — Other Ambulatory Visit: Payer: Self-pay | Admitting: Family Medicine

## 2018-06-27 DIAGNOSIS — R110 Nausea: Secondary | ICD-10-CM

## 2019-02-15 ENCOUNTER — Telehealth: Payer: Self-pay | Admitting: Diagnostic Neuroimaging

## 2019-02-15 ENCOUNTER — Encounter: Payer: Self-pay | Admitting: *Deleted

## 2019-02-15 NOTE — Telephone Encounter (Signed)
LVM requesting call back to update EMR.  

## 2019-02-15 NOTE — Telephone Encounter (Signed)
Pt gave consent for a video visit with our office. Pt understands that although there may be some limitations with this type of visit, we will take all precautions to reduce any security or privacy concerns.  Pt understands that this will be treated like an in office visit and we will file with pt's insurance, and there may be a patient responsible charge related to this service. °

## 2019-02-20 ENCOUNTER — Other Ambulatory Visit: Payer: Self-pay

## 2019-02-20 ENCOUNTER — Telehealth: Payer: Self-pay | Admitting: Diagnostic Neuroimaging

## 2019-02-20 ENCOUNTER — Ambulatory Visit (INDEPENDENT_AMBULATORY_CARE_PROVIDER_SITE_OTHER): Payer: Medicare Other | Admitting: Diagnostic Neuroimaging

## 2019-02-20 ENCOUNTER — Encounter: Payer: Self-pay | Admitting: Diagnostic Neuroimaging

## 2019-02-20 DIAGNOSIS — G4489 Other headache syndrome: Secondary | ICD-10-CM | POA: Diagnosis not present

## 2019-02-20 NOTE — Telephone Encounter (Signed)
[employer] Medicare no auth order sent to GI . They will reach out to the patient to schedule.

## 2019-02-20 NOTE — Progress Notes (Signed)
[facility]  PATIENT: Ryan Nicholson DOB: Oct 27, 1960  REFERRING CLINICIAN: R FOster HISTORY FROM: patient  REASON FOR VISIT: new consult    HISTORICAL  CHIEF COMPLAINT:  Chief Complaint  Patient presents with  . Headache    HISTORY OF PRESENT ILLNESS:   58 year old male here for evaluation of headaches.  Patient has had migraine headaches since age 13 years old consisting of left-sided throbbing severe headaches associated with photophobia, nausea.  Patient was treated with Imitrex and Topamax in the past with mild relief.  In 2018 patient developed new right-sided headache associated with jaw pain, was recommended to have neurology work-up but canceled appointment.  Headaches resolved spontaneously.  Also in 2018 patient had a "stroke" which was evaluated and treated in [place].  3 weeks ago patient was at home, and children were being allowed at home, and had onset of daughter screaming patient had severe right-sided headache and pain.  This is continued continuously since that time.  He is having some dizziness and confusion associated with this.  He is still sensitive to sound.  Symptoms are different than prior migraine.  Patient has had CT of the head with and without contrast by PCP which was unremarkable.  Patient cannot have MRI apparently due to presence of spinal cord stimulator which was placed in 2014.    Patient has history of chronic back pain related to workplace injury in the year 2000.  Patient had thoracic disc herniation at that time related to lifting heavy boxes.  Patient had surgery, spinal cord stimulator, and has been under chronic pain management since that time.   REVIEW OF SYSTEMS: Full 14 system review of systems performed and negative with exception of: As per HPI.  ALLERGIES: Allergies  Allergen Reactions  . Oxycontin [Oxycodone] Other (See Comments)    Unknown  . Reglan [Metoclopramide] Nausea Only    shakey     HOME MEDICATIONS: Outpatient Medications Prior to Visit  Medication Sig Dispense Refill  . albuterol (PROVENTIL HFA;VENTOLIN HFA) 108 (90 Base) MCG/ACT inhaler Inhale 2 puffs into the lungs every 4 (four) hours as needed for wheezing or shortness of breath.    Marland Kitchen aspirin 81 MG chewable tablet Chew 81 mg by mouth daily.    Marland Kitchen atorvastatin (LIPITOR) 40 MG tablet Take 80 mg by mouth at bedtime.    . baclofen (LIORESAL) 10 MG tablet Take 10 mg by mouth 2 (two) times daily.    . cyanocobalamin (,VITAMIN B-12,) 1000 MCG/ML injection Inject 1 mL (1,000 mcg total) into the muscle every 30 (thirty) days. 3 mL 1  . fluticasone (FLONASE) 50 MCG/ACT nasal spray Place 2 sprays into both nostrils daily. 16 g 11  . montelukast (SINGULAIR) 4 MG PACK Take 4 mg by mouth daily.    . naproxen (NAPROSYN) 500 MG tablet Take 1 tablet (500 mg total) by mouth 2 (two) times daily. 30 tablet 0  . ondansetron (ZOFRAN) 8 MG tablet Take 1 tablet (8 mg total) by mouth every 8 (eight) hours as needed for nausea or vomiting. 20 tablet 1  . Prenat-FeCbn-FeBisg-FA-Omega (MULTIVITAMIN/MINERALS PO) Take 1 tablet by mouth daily.    . promethazine (PHENERGAN) 25 MG tablet Take 25 mg by mouth every 6 (six) hours as needed for nausea or vomiting.    . sertraline (ZOLOFT) 100 MG tablet Take 100 mg by mouth daily.    . solifenacin (VESICARE) 5 MG tablet Take 5 mg by mouth at bedtime.    . tapentadol  HCl (NUCYNTA) 75 MG tablet Take 75 mg by mouth 4 (four) times daily.    . traZODone (DESYREL) 50 MG tablet Take 100 mg by mouth at bedtime.     No facility-administered medications prior to visit.     PAST MEDICAL HISTORY: Past Medical History:  Diagnosis Date  . B12 deficiency   . Dizziness   . DJD (degenerative joint disease)   . Gastric ulcer    hx of  . GERD (gastroesophageal reflux disease)   . Hypercholesteremia   . Migraine   . Post herpetic neuralgia   . Stroke [facility])     PAST SURGICAL HISTORY: Past Surgical History:   Procedure Laterality Date  . BACK SURGERY  09/1999, 01/2001  . CHOLECYSTECTOMY  2005  . COLON SURGERY    . GASTRIC BYPASS  03/2004  . [place]  2010  . [place], 2015  . left shoulder surgery  2007  . LUNG SURGERY Left 2002  . PROSTATE SURGERY    . SPINE SURGERY     spinal stimulator  . TONSILECTOMY/ADENOIDECTOMY WITH MYRINGOTOMY      FAMILY HISTORY: Family History  Problem Relation Age of Onset  . Breast cancer Mother   . Diabetes Father   . Hypertension Father   . Stroke Father   . Parkinson's disease Father   . Brain cancer Maternal Uncle     SOCIAL HISTORY: Social History   Socioeconomic History  . Marital status: Married    Spouse name: Mary  . Number of children: Not on file  . Years of education: Not on file  . Highest education level: Not on file  Occupational History  . Not on file  Social Needs  . Financial resource strain: Not on file  . Food insecurity:    Worry: Not on file    Inability: Not on file  . Transportation needs:    Medical: Not on file    Non-medical: Not on file  Tobacco Use  . Smoking status: Former Smoker    Last attempt to quit: 02/14/2001    Years since quitting: 18.0  . Smokeless tobacco: Never Used  Substance and Sexual Activity  . Alcohol use: No  . Drug use: No  . Sexual activity: Not on file  Lifestyle  . Physical activity:    Days per week: Not on file    Minutes per session: Not on file  . Stress: Not on file  Relationships  . Social connections:    Talks on phone: Not on file    Gets together: Not on file    Attends religious service: Not on file    Active member of club or organization: Not on file    Attends meetings of clubs or organizations: Not on file    Relationship status: Not on file  . Intimate partner violence:    Fear of current or ex partner: Not on file    Emotionally abused: Not on file    Physically abused: Not on file    Forced sexual activity:  Not on file  Other Topics Concern  . Not on file  Social History Narrative   Lives with wife     PHYSICAL EXAM  VIDEO EXAM  GENERAL EXAM/CONSTITUTIONAL:  Vitals: There were no vitals filed for this visit.  There is no height or weight on file to calculate BMI. Wt Readings from Last 3 Encounters:  06/03/17 200 lb (90.7 kg)  Patient is in no distress; well developed, nourished and groomed; neck is supple   NEUROLOGIC: MENTAL STATUS:  No flowsheet data found.  awake, alert, oriented to person, place and time  recent and remote memory intact  normal attention and concentration  language fluent, comprehension intact, naming intact  fund of knowledge appropriate  CRANIAL NERVE:   2nd, 3rd, 4th, 6th - visual fields full to confrontation, extraocular muscles intact, no nystagmus  5th - facial sensation symmetric  7th - facial strength symmetric  8th - hearing intact  11th - shoulder shrug symmetric  12th - tongue protrusion midline  MOTOR:   NO TREMOR; NO DRIFT IN BUE  SENSORY:   normal and symmetric to light touch  COORDINATION:   fine finger movements normal    DIAGNOSTIC DATA (LABS, IMAGING, TESTING) - I reviewed patient records, labs, notes, testing and imaging myself where available.  No results found for: WBC, HGB, HCT, MCV, PLT    Component Value Date/Time   NA 138 06/03/2017 1151   K 4.9 06/03/2017 1151   CL 101 06/03/2017 1151   CO2 25 06/03/2017 1151   GLUCOSE 91 06/03/2017 1151   BUN 15 06/03/2017 1151   CREATININE 0.98 06/03/2017 1151   CALCIUM 8.9 06/03/2017 1151   PROT 6.6 06/03/2017 1151   ALBUMIN 4.3 06/03/2017 1151   AST 24 06/03/2017 1151   ALT 14 06/03/2017 1151   ALKPHOS 62 06/03/2017 1151   BILITOT 0.4 06/03/2017 1151   GFRNONAA 85 06/03/2017 1151   GFRAA 99 06/03/2017 1151   Lab Results  Component Value Date   CHOL 153 06/03/2017   HDL 64 06/03/2017   LDLCALC 77 06/03/2017   TRIG 59 06/03/2017   CHOLHDL  2.4 06/03/2017   No results found for: HGBA1C Lab Results  Component Value Date   YBFXOVAN19 166 06/03/2017   No results found for: TSH   04/17/17 CT maxillofacial [I reviewed images myself and agree with interpretation. -VRP]  - Advanced degenerative change in the TMJ bilaterally. No fracture or dislocation.    ASSESSMENT AND PLAN  58 y.o. year old male here with history of migraine HA, now with new type of headaches:   Ddx: migraine vs temporal arteritis vs other secondary headache  1. Other headache syndrome    Virtual Visit via Video Note  I connected with Milderd Meager on 02/20/19 at 10:30 AM EDT by a video enabled telemedicine application and verified that I am speaking with the correct person using two identifiers.  Location: Patient: home Provider: office   I discussed the limitations of evaluation and management by telemedicine and the availability of in person appointments. The patient expressed understanding and agreed to proceed.   I discussed the assessment and treatment plan with the patient. The patient was provided an opportunity to ask questions and all were answered. The patient agreed with the plan and demonstrated an understanding of the instructions.   The patient was advised to call back or seek an in-person evaluation if the symptoms worsen or if the condition fails to improve as anticipated.  I provided 25 minutes of non-face-to-face time during this encounter.   PLAN:  - check MRI brain w/wo --> check if spinal stimulator is MRI compatible or not - check ESR, CRP  Orders Placed This Encounter  Procedures  . MR BRAIN W WO CONTRAST  . Sedimentation Rate  . C-reactive Protein   Return pending test results, for pending if symptoms worsen or fail to improve.  Penni Bombard, MD 1/88/4166, 06:30 AM Certified in Neurology, Neurophysiology and Neuroimaging  [facility] [address] [place], [place]  [address] [phone]

## 2019-04-26 NOTE — Telephone Encounter (Signed)
Patient called stating he has a spinal stimulator. When you get a chance can you put a new order in for [employer].  Name: [facility] # [id number]  Serial # [id number] Year Implanted April 2014.

## 2019-04-30 ENCOUNTER — Other Ambulatory Visit: Payer: Self-pay | Admitting: *Deleted

## 2019-04-30 DIAGNOSIS — G4489 Other headache syndrome: Secondary | ICD-10-CM

## 2019-04-30 NOTE — Telephone Encounter (Signed)
[employer] medicare no auth. faxed pacemaker info to [employer].

## 2019-05-01 NOTE — Telephone Encounter (Signed)
Ryan Nicholson at [facility] called stating that the patient pacemaker is not MRI safe. Please advise.

## 2019-05-01 NOTE — Telephone Encounter (Signed)
LVM advising patient of MRI not an option due to his spinal stimulator. Advised Dr Gladstone Lighter recommendation of no further testing; he has had CT scan already. I requested he call back to discuss headaches, advised Dr Leta Baptist can prescribe medications if needed.  Left office number.

## 2019-05-01 NOTE — Telephone Encounter (Signed)
May consider migraine tx. TPX + triptan. No other testing recommended. -VRP

## 2019-05-02 NOTE — Telephone Encounter (Signed)
Noted  

## 2019-05-17 ENCOUNTER — Other Ambulatory Visit: Payer: Self-pay

## 2019-05-17 ENCOUNTER — Observation Stay (HOSPITAL_COMMUNITY)
Admission: EM | Admit: 2019-05-17 | Discharge: 2019-05-18 | Disposition: A | Discharge status: Home / Self Care | Payer: Medicare Other | Attending: Internal Medicine | Admitting: Internal Medicine

## 2019-05-17 ENCOUNTER — Emergency Department (HOSPITAL_COMMUNITY): Payer: Medicare Other

## 2019-05-17 DIAGNOSIS — G459 Transient cerebral ischemic attack, unspecified: Principal | ICD-10-CM | POA: Diagnosis present

## 2019-05-17 DIAGNOSIS — Z87891 Personal history of nicotine dependence: Secondary | ICD-10-CM | POA: Insufficient documentation

## 2019-05-17 DIAGNOSIS — Z7982 Long term (current) use of aspirin: Secondary | ICD-10-CM | POA: Diagnosis not present

## 2019-05-17 DIAGNOSIS — Z20828 Contact with and (suspected) exposure to other viral communicable diseases: Secondary | ICD-10-CM | POA: Diagnosis not present

## 2019-05-17 DIAGNOSIS — I639 Cerebral infarction, unspecified: Secondary | ICD-10-CM

## 2019-05-17 DIAGNOSIS — R531 Weakness: Secondary | ICD-10-CM | POA: Diagnosis present

## 2019-05-17 DIAGNOSIS — R739 Hyperglycemia, unspecified: Secondary | ICD-10-CM

## 2019-05-17 DIAGNOSIS — Z79899 Other long term (current) drug therapy: Secondary | ICD-10-CM | POA: Diagnosis not present

## 2019-05-17 LAB — DIFFERENTIAL
Abs Immature Granulocytes: 0.03 10*3/uL (ref 0.00–0.07)
Basophils Absolute: 0 10*3/uL (ref 0.0–0.1)
Basophils Relative: 1 %
Eosinophils Absolute: 0.1 10*3/uL (ref 0.0–0.5)
Eosinophils Relative: 2 %
Immature Granulocytes: 1 %
Lymphocytes Relative: 32 %
Lymphs Abs: 1.3 10*3/uL (ref 0.7–4.0)
Monocytes Absolute: 0.3 10*3/uL (ref 0.1–1.0)
Monocytes Relative: 6 %
Neutro Abs: 2.5 10*3/uL (ref 1.7–7.7)
Neutrophils Relative %: 58 %

## 2019-05-17 LAB — COMPREHENSIVE METABOLIC PANEL
ALT: 21 U/L (ref 0–44)
AST: 27 U/L (ref 15–41)
Albumin: 3.5 g/dL (ref 3.5–5.0)
Alkaline Phosphatase: 59 U/L (ref 38–126)
Anion gap: 7 (ref 5–15)
BUN: 12 mg/dL (ref 6–20)
CO2: 25 mmol/L (ref 22–32)
Calcium: 9.1 mg/dL (ref 8.9–10.3)
Chloride: 106 mmol/L (ref 98–111)
Creatinine, Ser: 1.13 mg/dL (ref 0.61–1.24)
GFR calc Af Amer: >60 mL/min (ref >60)
GFR calc non Af Amer: >60 mL/min (ref >60)
Glucose, Bld: 210 mg/dL — ABNORMAL HIGH (ref 70–99)
Potassium: 4.6 mmol/L (ref 3.5–5.1)
Sodium: 138 mmol/L (ref 135–145)
Total Bilirubin: 0.6 mg/dL (ref 0.3–1.2)
Total Protein: 6.5 g/dL (ref 6.5–8.1)

## 2019-05-17 LAB — I-STAT CHEM 8, ED
BUN: 13 mg/dL (ref 6–20)
Calcium, Ion: 1.19 mmol/L (ref 1.15–1.40)
Chloride: 107 mmol/L (ref 98–111)
Creatinine, Ser: 0.9 mg/dL (ref 0.61–1.24)
Glucose, Bld: 200 mg/dL — ABNORMAL HIGH (ref 70–99)
HCT: 40 % (ref 39.0–52.0)
Hemoglobin: 13.6 g/dL (ref 13.0–17.0)
Potassium: 4.6 mmol/L (ref 3.5–5.1)
Sodium: 140 mmol/L (ref 135–145)
TCO2: 22 mmol/L (ref 22–32)

## 2019-05-17 LAB — CBC
HCT: 40.5 % (ref 39.0–52.0)
Hemoglobin: 13 g/dL (ref 13.0–17.0)
MCH: 30.3 pg (ref 26.0–34.0)
MCHC: 32.1 g/dL (ref 30.0–36.0)
MCV: 94.4 fL (ref 80.0–100.0)
Platelets: 165 10*3/uL (ref 150–400)
RBC: 4.29 MIL/uL (ref 4.22–5.81)
RDW: 13.1 % (ref 11.5–15.5)
WBC: 4.3 10*3/uL (ref 4.0–10.5)
nRBC: 0 % (ref 0.0–0.2)

## 2019-05-17 LAB — APTT: aPTT: 28 seconds (ref 24–36)

## 2019-05-17 LAB — PROTIME-INR
INR: 1.1 (ref 0.8–1.2)
Prothrombin Time: 13.7 seconds (ref 11.4–15.2)

## 2019-05-17 IMAGING — CT CT HEAD WITHOUT CONTRAST
4 series · 16 of 47 positions shown, 18 images · non-contrast
Comparison: None.

CLINICAL DATA: Right-sided weakness and facial numbness. Short-term
memory loss

EXAM:
CT HEAD WITHOUT CONTRAST
TECHNIQUE: Contiguous axial images were obtained from the base of the skull
through the vertex without intravenous contrast.

[Series 3: head without · axial · non-contrast · 0.41mm/px · z∈[-70,+50]mm · 7 of 34 slices shown, 9 images]
[im 5/34  brain]
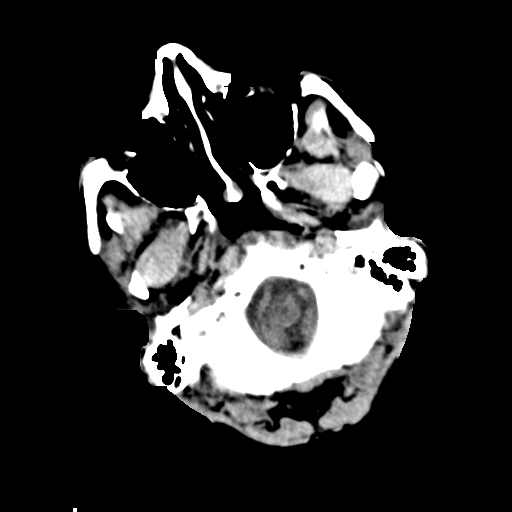
[im 5/34  bone]
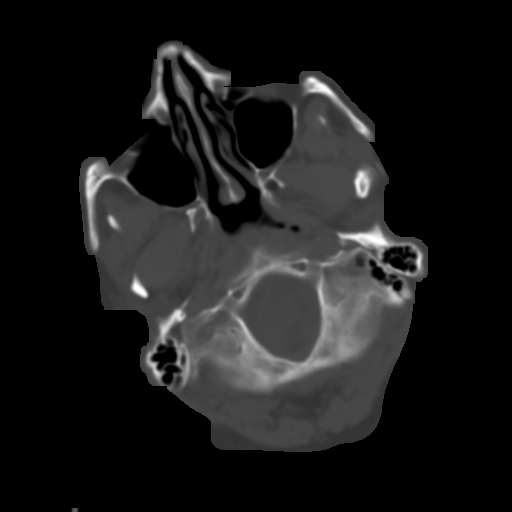
[im 9/34  brain]
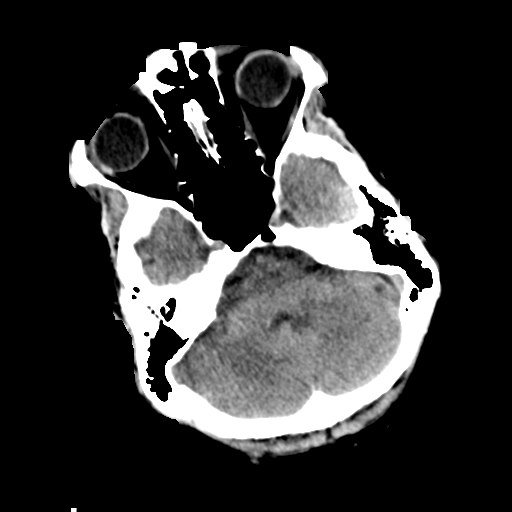
[im 13/34  brain]
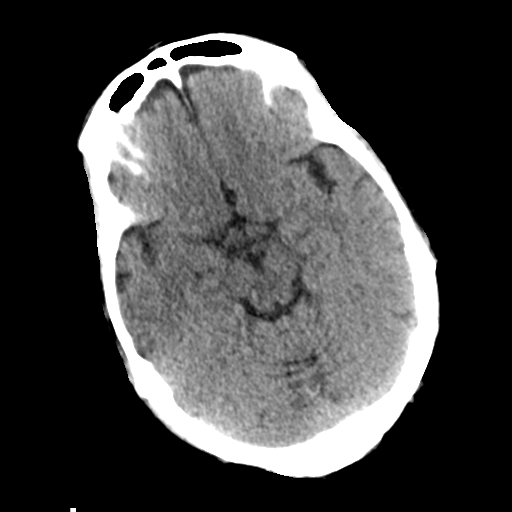
[im 17/34  brain]
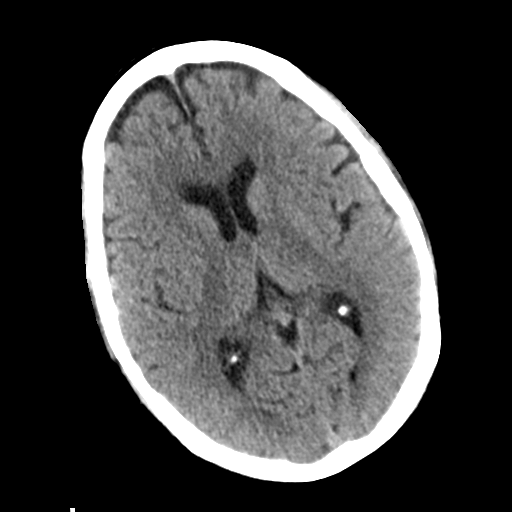
[im 21/34  brain]
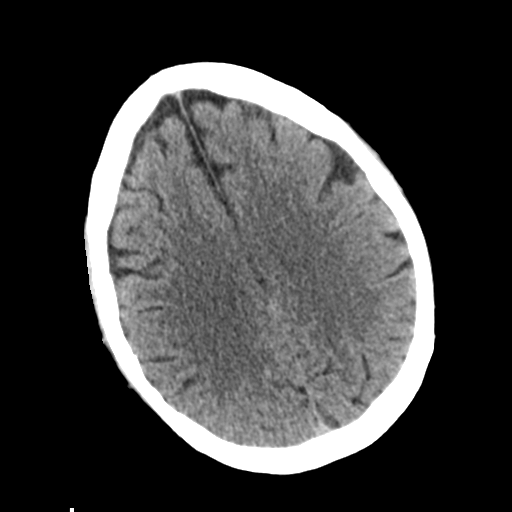
[im 21/34  bone]
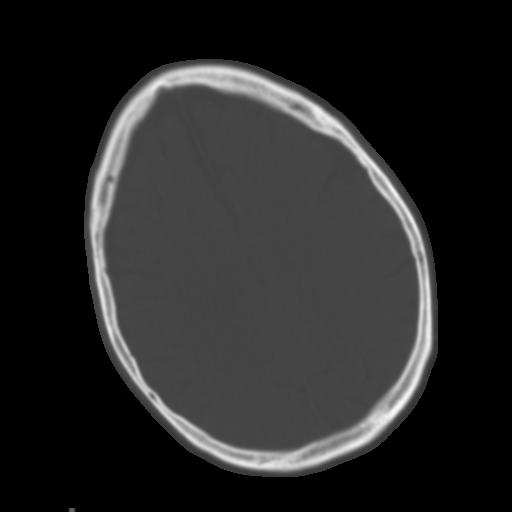
[im 25/34  brain]
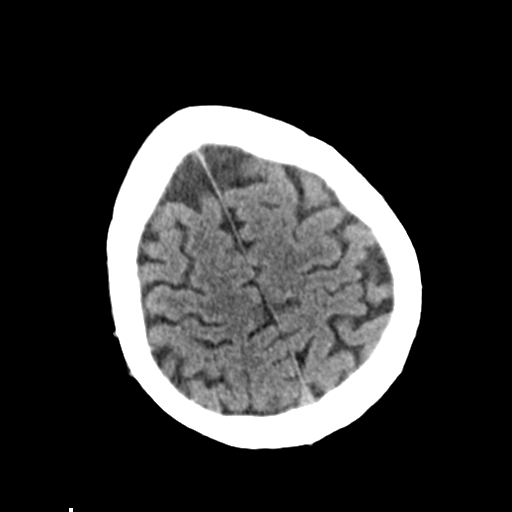
[im 29/34  brain]
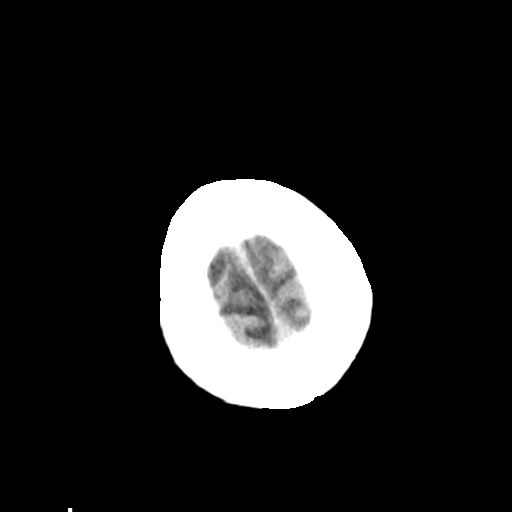

[Series 4: head bone · axial · 0.41mm/px · z∈[-74,-42]mm · 3 of 83 slices shown]
[im 9/83  bone]
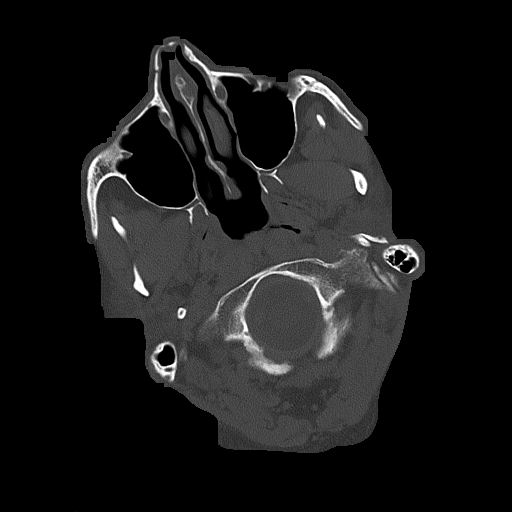
[im 17/83  bone]
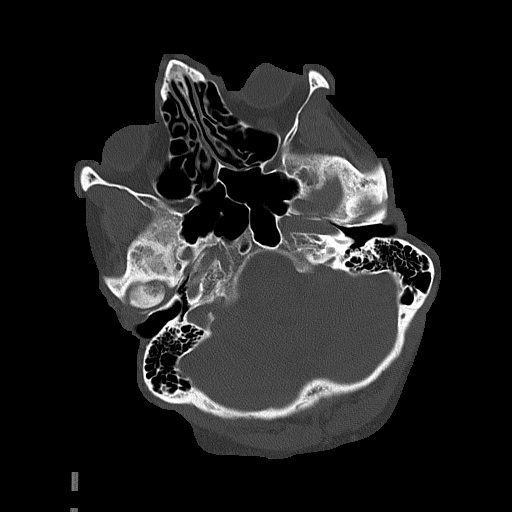
[im 25/83  bone]
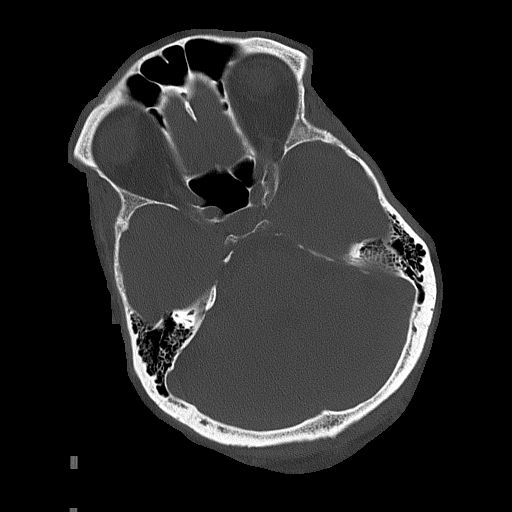

[Series 5: head without cor · coronal · non-contrast · 0.32mm/px · 3 of 72 slices shown]
[im 24/72  brain]
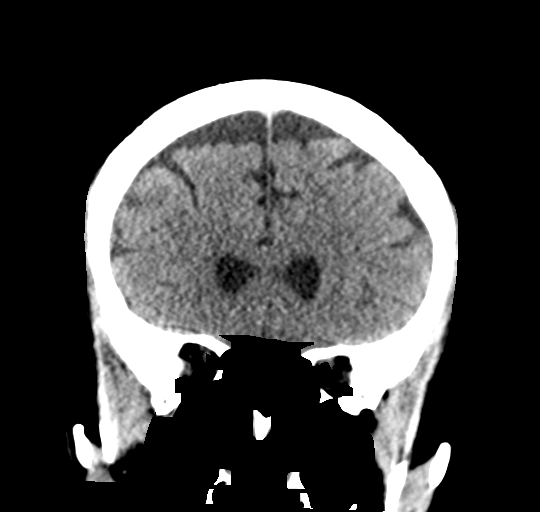
[im 32/72  brain]
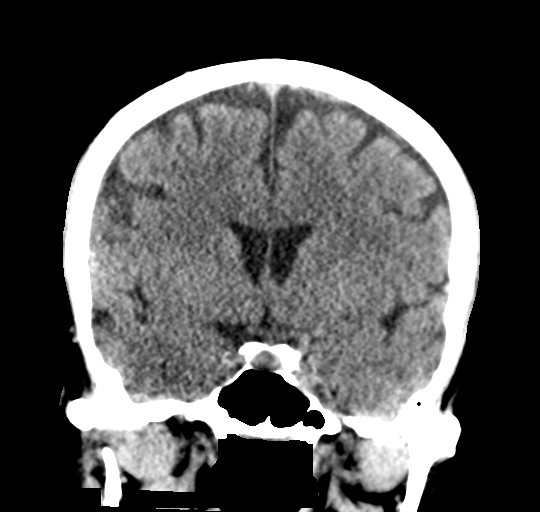
[im 40/72  brain]
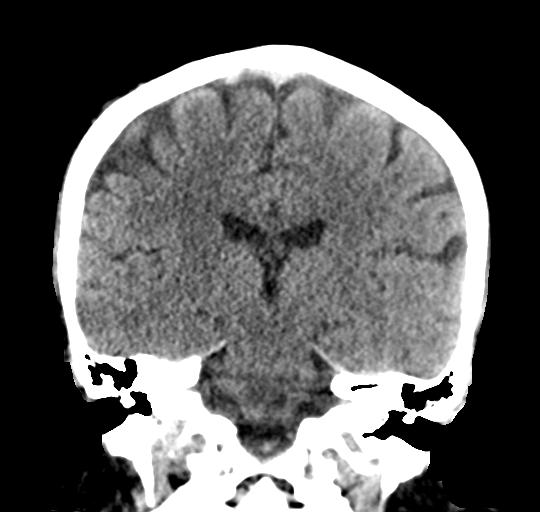

[Series 6: head without sag · sagittal · non-contrast · 0.32mm/px · 3 of 57 slices shown]
[im 20/57  brain]
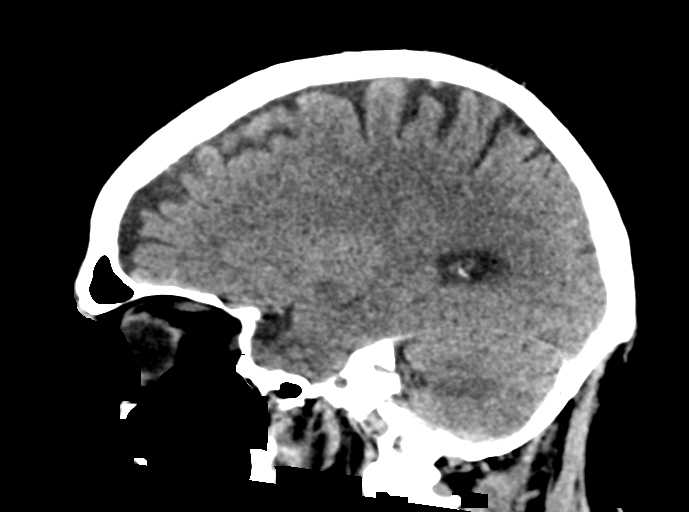
[im 29/57  brain]
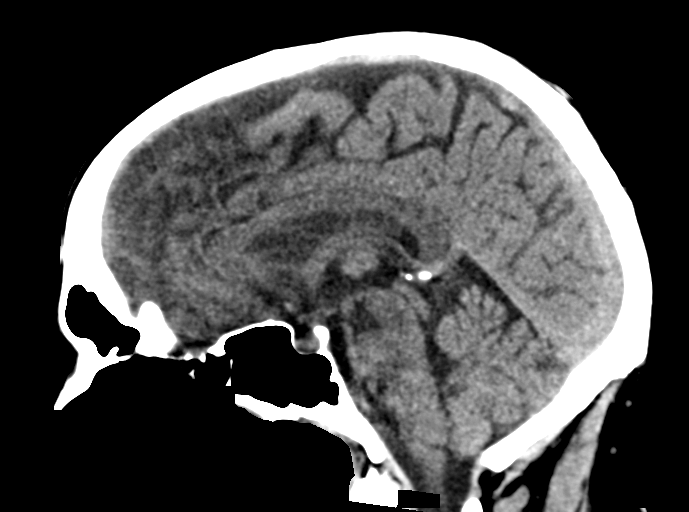
[im 38/57  brain]
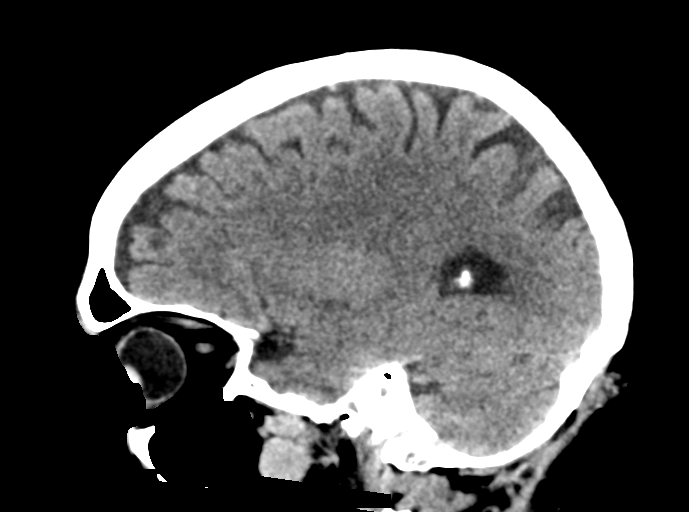

[16 of 47 positions shown; findings below may reference images not displayed]

FINDINGS: Brain: Ventricles and sulci are within normal limits for age. There
is no intracranial mass, hemorrhage, extra-axial fluid collection,
or midline shift. Brain parenchyma appears unremarkable. No acute
infarct evident.

Vascular: There is no hyperdense vessel. No appreciable vascular
calcification.

Skull: The bony calvarium appears intact.

Sinuses/Orbits: There is mild mucosal thickening in several ethmoid
air cells. Other visualized paranasal sinuses are clear. Orbits
appear symmetric bilaterally except for apparent cataract removal on
the right.

Other: Mastoid air cells are clear.
IMPRESSION: Suspect previous cataract removal on the right. Mucosal thickening
noted in several ethmoid air cells.

Brain parenchyma appears unremarkable. No evident acute infarct. No
mass or hemorrhage.

## 2019-05-17 MED ORDER — SODIUM CHLORIDE 0.9% FLUSH
3.0000 mL | Freq: Once | INTRAVENOUS | Status: DC
Start: 2019-05-17 — End: 2019-05-18

## 2019-05-17 NOTE — ED Triage Notes (Signed)
Patient reports noticing R sided weakness upon waking at 0900 this morning. Was normal when he went to bed. Also states the R side of his face feels numb. History of stroke a few years ago that resulted in transient short-term memory loss. He does state that the weakness has improved since its onset - was stumbling walking to bathroom this morning and feels his ability to walk has gotten better, but R leg and arm still feel heavy. A&O x 4. R grip slightly weaker than the left. Denies vision or speech changes, no facial droop.

## 2019-05-17 NOTE — ED Provider Notes (Signed)
[facility] Provider Note   CSN: [id number] Arrival date & time: 05/17/19  1521     History   Chief Complaint Chief Complaint  Patient presents with  . Extremity Weakness    HPI Ryan Nicholson is a 58 y.o. male.     The history is provided by the patient and medical records. No language interpreter was used.  Extremity Weakness   Ryan Nicholson is a 58 y.o. male with hx as listed below who presents to ED for evaluation of right sided upper and lower extremity weakness and facial numbness which was present when he woke up this morning at 9 AM.  Significant other at bedside states that he could not ambulate independently and that she had to help him to the bathroom.  He was "just stumbling around".  He felt like he could not control his right upper and lower extremity for a few hours, but now feels as if this is starting to improve.  He feels as if his ambulation is better, but does not feel back to his baseline.  Denies difficulty with speech or changes in vision.  On a statin and baby aspirin.  [facility] with plan for outpatient MRI.  He reports that because of his spinal stimulator, he has been unable to get MRI done.  Past Medical History:  Diagnosis Date  . B12 deficiency   . Dizziness   . DJD (degenerative joint disease)   . Gastric ulcer    hx of  . GERD (gastroesophageal reflux disease)   . Hypercholesteremia   . Migraine   . Post herpetic neuralgia   . Stroke [facility](HCC)     Patient Active Problem List   Diagnosis Date Noted  . Anemia 10/06/2017  . Back pain 10/06/2017  . Noninfectious gastroenteritis and colitis 10/06/2017  . History of colonic polyps 10/06/2017  . Duodenal ulcer 10/06/2017  . Gall stones 10/06/2017  . IBS (irritable bowel syndrome) 10/06/2017  . Kidney stone 10/06/2017  . Migraines 10/06/2017  . Numbness and tingling of both feet 10/06/2017  . Pneumonia 10/06/2017  .  Enlarged prostate 10/06/2017  . Gastro-esophageal reflux 10/06/2017  . Sleep apnea 10/06/2017  . Ulcer, stomach peptic 10/06/2017    Past Surgical History:  Procedure Laterality Date  . BACK SURGERY  09/1999, 01/2001  . CHOLECYSTECTOMY  2005  . COLON SURGERY    . GASTRIC BYPASS  03/2004  . HEMORRHOID SURGERY  2010  . HERNIA REPAIR    . KNEE SURGERY  1980, 1985, 2015  . left shoulder surgery  2007  . LUNG SURGERY Left 2002  . PROSTATE SURGERY    . SPINE SURGERY     spinal stimulator  . TONSILECTOMY/ADENOIDECTOMY WITH MYRINGOTOMY          Home Medications    Prior to Admission medications   Medication Sig Start Date End Date Taking? Authorizing Provider  albuterol (PROVENTIL HFA;VENTOLIN HFA) 108 (90 Base) MCG/ACT inhaler Inhale 2 puffs into the lungs every 4 (four) hours as needed for wheezing or shortness of breath.    [provider]  aspirin 81 MG chewable tablet Chew 81 mg by mouth daily.    [provider]  atorvastatin (LIPITOR) 40 MG tablet Take 80 mg by mouth at bedtime.    [provider]  baclofen (LIORESAL) 10 MG tablet Take 10 mg by mouth 2 (two) times daily.    [provider]  cyanocobalamin (,  VITAMIN B-12,) 1000 MCG/ML injection Inject 1 mL (1,000 mcg total) into the muscle every 30 (thirty) days. 06/03/17   Shade FloodGreene, Jeffrey R, MD  fluticasone (FLONASE) 50 MCG/ACT nasal spray Place 2 sprays into both nostrils daily. 06/03/17   Shade FloodGreene, Jeffrey R, MD  montelukast (SINGULAIR) 4 MG PACK Take 4 mg by mouth daily.    [provider]  naproxen (NAPROSYN) 500 MG tablet Take 1 tablet (500 mg total) by mouth 2 (two) times daily. 04/17/17   Fayrene Helperran, Bowie, PA-C  ondansetron (ZOFRAN) 8 MG tablet Take 1 tablet (8 mg total) by mouth every 8 (eight) hours as needed for nausea or vomiting. 06/03/17   Shade FloodGreene, Jeffrey R, MD  Prenat-FeCbn-FeBisg-FA-Omega (MULTIVITAMIN/MINERALS PO) Take 1 tablet by mouth daily.    [provider]   promethazine (PHENERGAN) 25 MG tablet Take 25 mg by mouth every 6 (six) hours as needed for nausea or vomiting.    [provider]  sertraline (ZOLOFT) 100 MG tablet Take 100 mg by mouth daily.    [provider]  solifenacin (VESICARE) 5 MG tablet Take 5 mg by mouth at bedtime.    [provider]  tapentadol HCl (NUCYNTA) 75 MG tablet Take 75 mg by mouth 4 (four) times daily.    [provider]  traZODone (DESYREL) 50 MG tablet Take 100 mg by mouth at bedtime.    [provider]    Family History Family History  Problem Relation Age of Onset  . Breast cancer Mother   . Diabetes Father   . Hypertension Father   . Stroke Father   . Parkinson's disease Father   . Brain cancer Maternal Uncle     Social History Social History   Tobacco Use  . Smoking status: Former Smoker    Quit date: 02/14/2001    Years since quitting: 18.2  . Smokeless tobacco: Never Used  Substance Use Topics  . Alcohol use: No  . Drug use: No     Allergies   Oxycontin [oxycodone] and Reglan [metoclopramide]   Review of Systems Review of Systems  Musculoskeletal: Positive for extremity weakness.  Neurological: Positive for weakness and numbness. Negative for syncope and speech difficulty.  All other systems reviewed and are negative.    Physical Exam Updated Vital Signs BP 104/84   Pulse (!) 59   Temp 97.8 F (36.6 C) (Oral)   Resp 11   SpO2 100%   Physical Exam Vitals signs and nursing note reviewed.  Constitutional:      General: He is not in acute distress.    Appearance: He is well-developed.  HENT:     Head: Normocephalic and atraumatic.  Neck:     Musculoskeletal: Neck supple.  Cardiovascular:     Rate and Rhythm: Normal rate and regular rhythm.     Heart sounds: Normal heart sounds. No murmur.  Pulmonary:     Effort: Pulmonary effort is normal. No respiratory distress.     Breath sounds: Normal breath sounds.  Abdominal:      General: There is no distension.     Palpations: Abdomen is soft.     Tenderness: There is no abdominal tenderness.  Skin:    General: Skin is warm and dry.  Neurological:     Mental Status: He is alert and oriented to person, place, and time.     Comments: Alert, oriented, thought content appropriate, able to give a coherent history. Speech is clear and goal oriented, able to follow commands.  Cranial Nerves:  II:  Peripheral visual fields grossly normal, pupils equal, round, reactive to light III, IV, VI: EOM intact bilaterally, ptosis not present V,VII: smile symmetric, eyes kept closed tightly against resistance, facial light touch sensation equal VIII: hearing grossly normal IX, X: symmetric soft palate movement, uvula elevates symmetrically  XI: bilateral shoulder shrug symmetric and strong XII: midline tongue extension Decreased muscle strength and diminished sensation in RUE and RLE when compared to left.       ED Treatments / Results  Labs (all labs ordered are listed, but only abnormal results are displayed) Labs Reviewed  COMPREHENSIVE METABOLIC PANEL - Abnormal; Notable for the following components:      Result Value   Glucose, Bld 210 (*)    All other components within normal limits  I-STAT CHEM 8, ED - Abnormal; Notable for the following components:   Glucose, Bld 200 (*)    All other components within normal limits  SARS CORONAVIRUS 2  PROTIME-INR  APTT  CBC  DIFFERENTIAL  CBG MONITORING, ED    EKG EKG Interpretation  Date/Time:  Thursday May 17 2019 15:35:47 EDT Ventricular Rate:  78 PR Interval:  150 QRS Duration: 88 QT Interval:  378 QTC Calculation: 430 R Axis:   68 Text Interpretation:  Normal sinus rhythm Normal ECG Confirmed by Fredia Sorrow [phone]) on 05/17/2019 9:20:08 PM   Radiology Ct Head Wo Contrast  Result Date: 05/17/2019 CLINICAL DATA:  Right-sided weakness and facial numbness. Short-term memory loss EXAM: CT HEAD WITHOUT  CONTRAST TECHNIQUE: Contiguous axial images were obtained from the base of the skull through the vertex without intravenous contrast. COMPARISON:  None. FINDINGS: Brain: Ventricles and sulci are within normal limits for age. There is no intracranial mass, hemorrhage, extra-axial fluid collection, or midline shift. Brain parenchyma appears unremarkable. No acute infarct evident. Vascular: There is no hyperdense vessel. No appreciable vascular calcification. Skull: The bony calvarium appears intact. Sinuses/Orbits: There is mild mucosal thickening in several ethmoid air cells. Other visualized paranasal sinuses are clear. Orbits appear symmetric bilaterally except for apparent cataract removal on the right. Other: Mastoid air cells are clear. IMPRESSION: Suspect previous cataract removal on the right. Mucosal thickening noted in several ethmoid air cells. Brain parenchyma appears unremarkable. No evident acute infarct. No mass or hemorrhage. Electronically Signed   By: Lowella Grip III M.D.   On: 05/17/2019 16:47    Procedures Procedures (including critical care time)  Medications Ordered in ED Medications  sodium chloride flush (NS) 0.9 % injection 3 mL (has no administration in time range)     Initial Impression / Assessment and Plan / ED Course  I have reviewed the triage vital signs and the nursing notes.  Pertinent labs & imaging results that were available during my care of the patient were reviewed by me and considered in my medical decision making (see chart for details).       Ryan Nicholson is a 58 y.o. male who presents to ED for right upper extremity and right lower extremity weakness which was present upon awakening at 9 AM this morning.  He also reports difficulty with his ambulation and significant other reports that he was stumbling around when trying to walk to the bathroom.  On my exam, he does have decreased strength on the right when compared to left. CT head negative for  acute abnormalities.  History and examination is concerning for stroke.  I discussed case with on-call neurologist, Dr. Leonel Ramsay, who will evaluate.  Spoke  with Dr. Amada JupiterKirkpatrick after he evaluated patient.  Recommends admission to the hospitalist for stroke work-up.  Neurology will follow.  Discussed with hospitalist who will admit.    Final Clinical Impressions(s) / ED Diagnoses   Final diagnoses:  Cerebrovascular accident (CVA), unspecified mechanism [facility](HCC)    ED Discharge Orders    None       Ajahni Nay, Chase PicketJaime Pilcher, PA-C 05/17/19 2230    Vanetta MuldersZackowski, Scott, MD 05/26/19 1202

## 2019-05-17 NOTE — H&P (Signed)
History and Physical  Kevan NyCraig A Lovejoy WUJ:[id number] DOB: 04-18-1961 DOA: 05/17/2019  Referring physician: ER provider PCP: Patient, No Pcp Per  Outpatient Specialists:    Patient coming from: Home  Chief Complaint: Inability to control the right side and right-sided weakness.  HPI:  Patient is a 58 year old male past medical history significant for migraines, hyperlipidemia, DJD, dizziness and B12 deficiency.  Patient has a spinal cord stimulator that may not be MRI compatible.  According to the patient, he woke up this morning with inability to control the right side of the body with right-sided weakness.  No associated speech problems.  Patient is not a particularly good historian.  Patient reports numbness of the right side of the head, otherwise, no neck pain, no fever or chills, no URI symptoms, no chest pain, no shortness of breath, no GI symptoms and no urinary symptoms.  CT head without contrast done on presentation did not reveal any acute findings.  EKG revealed normal sinus rhythm.  Blood pressure is controlled.  Patient has been seen by the neurology/stroke team and they are directing care.  The hospitalist service has been asked to admit patient.  ED Course: On presentation to the hospital, patient was afebrile.  Vital signs were stable.  CT head is nonrevealing.  EKG is nonrevealing.  Neurology team is directing stroke management.  Pertinent labs: CBC reveals WBC of 4.3, hemoglobin of 13, hematocrit of 40.5 with platelet count of 165.  Chemistry reveals sodium of 138, potassium of 4.6, chloride 106, CO2 25, BUN of 12, creatinine of 1.1, blood sugar of 210.  EKG: Independently reviewed.   Imaging: independently reviewed.   Review of Systems:  Negative for fever, visual changes, sore throat, rash, new muscle aches, chest pain, SOB, dysuria, bleeding, n/v/abdominal pain.  Past Medical History:  Diagnosis Date  . B12 deficiency   . Dizziness   . DJD (degenerative joint disease)    . Gastric ulcer    hx of  . GERD (gastroesophageal reflux disease)   . Hypercholesteremia   . Migraine   . Post herpetic neuralgia   . Stroke [facility](HCC)     Past Surgical History:  Procedure Laterality Date  . BACK SURGERY  09/1999, 01/2001  . CHOLECYSTECTOMY  2005  . COLON SURGERY    . GASTRIC BYPASS  03/2004  . HEMORRHOID SURGERY  2010  . HERNIA REPAIR    . KNEE SURGERY  1980, 1985, 2015  . left shoulder surgery  2007  . LUNG SURGERY Left 2002  . PROSTATE SURGERY    . SPINE SURGERY     spinal stimulator  . TONSILECTOMY/ADENOIDECTOMY WITH MYRINGOTOMY       reports that he quit smoking about 18 years ago. He has never used smokeless tobacco. He reports that he does not drink alcohol or use drugs.  Allergies  Allergen Reactions  . Oxycontin [Oxycodone] Other (See Comments)    Unknown  . Reglan [Metoclopramide] Nausea Only    shakey    Family History  Problem Relation Age of Onset  . Breast cancer Mother   . Diabetes Father   . Hypertension Father   . Stroke Father   . Parkinson's disease Father   . Brain cancer Maternal Uncle      Prior to Admission medications   Medication Sig Start Date End Date Taking? Authorizing Provider  albuterol (PROVENTIL HFA;VENTOLIN HFA) 108 (90 Base) MCG/ACT inhaler Inhale 2 puffs into the lungs every 4 (four) hours as needed for wheezing or shortness  of breath.    [provider]  aspirin 81 MG chewable tablet Chew 81 mg by mouth daily.    [provider]  atorvastatin (LIPITOR) 40 MG tablet Take 80 mg by mouth at bedtime.    [provider]  baclofen (LIORESAL) 10 MG tablet Take 10 mg by mouth 2 (two) times daily.    [provider]  cyanocobalamin (,VITAMIN B-12,) 1000 MCG/ML injection Inject 1 mL (1,000 mcg total) into the muscle every 30 (thirty) days. 06/03/17   Shade FloodGreene, Jeffrey R, MD  fluticasone (FLONASE) 50 MCG/ACT nasal spray Place 2 sprays into both nostrils daily. 06/03/17   Shade FloodGreene, Jeffrey R, MD   montelukast (SINGULAIR) 4 MG PACK Take 4 mg by mouth daily.    [provider]  naproxen (NAPROSYN) 500 MG tablet Take 1 tablet (500 mg total) by mouth 2 (two) times daily. 04/17/17   Fayrene Helperran, Bowie, PA-C  ondansetron (ZOFRAN) 8 MG tablet Take 1 tablet (8 mg total) by mouth every 8 (eight) hours as needed for nausea or vomiting. 06/03/17   Shade FloodGreene, Jeffrey R, MD  Prenat-FeCbn-FeBisg-FA-Omega (MULTIVITAMIN/MINERALS PO) Take 1 tablet by mouth daily.    [provider]  promethazine (PHENERGAN) 25 MG tablet Take 25 mg by mouth every 6 (six) hours as needed for nausea or vomiting.    [provider]  sertraline (ZOLOFT) 100 MG tablet Take 100 mg by mouth daily.    [provider]  solifenacin (VESICARE) 5 MG tablet Take 5 mg by mouth at bedtime.    [provider]  tapentadol HCl (NUCYNTA) 75 MG tablet Take 75 mg by mouth 4 (four) times daily.    [provider]  traZODone (DESYREL) 50 MG tablet Take 100 mg by mouth at bedtime.    [provider]    Physical Exam: Vitals:   05/17/19 2027 05/17/19 2045 05/17/19 2145 05/17/19 2200  BP:  (!) 97/55 (!) 102/59 104/84  Pulse: 61 (!) 59    Resp: 12 12 16 11   Temp:      TempSrc:      SpO2: 100% 100%      Constitutional:  . Appears calm and comfortable Eyes:  . No pallor. No jaundice.  ENMT:  . external ears, nose appear normal Neck:  . Neck is supple. No JVD Respiratory:  . CTA bilaterally, no w/r/r.  . Respiratory effort normal. No retractions or accessory muscle use Cardiovascular:  . S1S2 . No LE edema   Abdomen:  . Abdomen is soft and non tender. Organs are difficult to assess. Neurologic:  . Awake and alert. . Moves all limbs.  Wt Readings from Last 3 Encounters:  06/03/17 90.7 kg    I have personally reviewed following labs and imaging studies  Labs on Admission:  CBC: Recent Labs  Lab 05/17/19 1533 05/17/19 1549  WBC 4.3  --   NEUTROABS 2.5  --   HGB 13.0  13.6  HCT 40.5 40.0  MCV 94.4  --   PLT 165  --    Basic Metabolic Panel: Recent Labs  Lab 05/17/19 1533 05/17/19 1549  NA 138 140  K 4.6 4.6  CL 106 107  CO2 25  --   GLUCOSE 210* 200*  BUN 12 13  CREATININE 1.13 0.90  CALCIUM 9.1  --    Liver Function Tests: Recent Labs  Lab 05/17/19 1533  AST 27  ALT 21  ALKPHOS 59  BILITOT 0.6  PROT 6.5  ALBUMIN 3.5  No results for input(s): LIPASE, AMYLASE in the last 168 hours. No results for input(s): AMMONIA in the last 168 hours. Coagulation Profile: Recent Labs  Lab 05/17/19 1533  INR 1.1   Cardiac Enzymes: No results for input(s): CKTOTAL, CKMB, CKMBINDEX, TROPONINI in the last 168 hours. BNP (last 3 results) No results for input(s): PROBNP in the last 8760 hours. HbA1C: No results for input(s): HGBA1C in the last 72 hours. CBG: No results for input(s): GLUCAP in the last 168 hours. Lipid Profile: No results for input(s): CHOL, HDL, LDLCALC, TRIG, CHOLHDL, LDLDIRECT in the last 72 hours. Thyroid Function Tests: No results for input(s): TSH, T4TOTAL, FREET4, T3FREE, THYROIDAB in the last 72 hours. Anemia Panel: No results for input(s): VITAMINB12, FOLATE, FERRITIN, TIBC, IRON, RETICCTPCT in the last 72 hours. Urine analysis: No results found for: COLORURINE, APPEARANCEUR, LABSPEC, PHURINE, GLUCOSEU, HGBUR, BILIRUBINUR, KETONESUR, PROTEINUR, UROBILINOGEN, NITRITE, LEUKOCYTESUR Sepsis Labs: @LABRCNTIP (procalcitonin:4,lacticidven:4) )No results found for this or any previous visit (from the past 240 hour(s)).    Radiological Exams on Admission: Ct Head Wo Contrast  Result Date: 05/17/2019 CLINICAL DATA:  Right-sided weakness and facial numbness. Short-term memory loss EXAM: CT HEAD WITHOUT CONTRAST TECHNIQUE: Contiguous axial images were obtained from the base of the skull through the vertex without intravenous contrast. COMPARISON:  None. FINDINGS: Brain: Ventricles and sulci are within normal limits for age.  There is no intracranial mass, hemorrhage, extra-axial fluid collection, or midline shift. Brain parenchyma appears unremarkable. No acute infarct evident. Vascular: There is no hyperdense vessel. No appreciable vascular calcification. Skull: The bony calvarium appears intact. Sinuses/Orbits: There is mild mucosal thickening in several ethmoid air cells. Other visualized paranasal sinuses are clear. Orbits appear symmetric bilaterally except for apparent cataract removal on the right. Other: Mastoid air cells are clear. IMPRESSION: Suspect previous cataract removal on the right. Mucosal thickening noted in several ethmoid air cells. Brain parenchyma appears unremarkable. No evident acute infarct. No mass or hemorrhage. Electronically Signed   By: Lowella Grip III M.D.   On: 05/17/2019 16:47    EKG: Independently reviewed.   Active Problems:   * No active hospital problems. *   Assessment/Plan Possible TIA: Admit patient for further assessment and management. Neurology team is seen patient. Neurology team is directing care.  Antiplatelet therapy as per neurology team Patient has a spinal cord stimulator that may not be MRI compatible. Use stroke protocol Consult PT. Further management will depend on hospital course.  Elevated blood sugar: Check HbA1c Fasting blood sugar Monitor blood sugar closely  Hyperlipidemia: Check lipid profile  Further management depend on hospital course.  DVT prophylaxis: Subcutaneous Lovenox Code Status: Full code Family Communication:  Disposition Plan: Home eventually Consults called: Neurology has seen patient Admission status: Observation  Time spent: 65 minutes.  Dana Allan, MD  [facility] Pager #: [phone] 7PM-7AM contact night coverage as above  05/17/2019, 10:26 PM

## 2019-05-17 NOTE — ED Notes (Signed)
Attempted to call report

## 2019-05-17 NOTE — ED Notes (Signed)
Cassiel Fernandez - wife - [phone]

## 2019-05-17 NOTE — ED Notes (Signed)
Neuro provider bedside 

## 2019-05-18 ENCOUNTER — Observation Stay (HOSPITAL_BASED_OUTPATIENT_CLINIC_OR_DEPARTMENT_OTHER): Payer: Medicare Other

## 2019-05-18 ENCOUNTER — Observation Stay (HOSPITAL_COMMUNITY): Payer: Medicare Other

## 2019-05-18 DIAGNOSIS — G459 Transient cerebral ischemic attack, unspecified: Secondary | ICD-10-CM

## 2019-05-18 DIAGNOSIS — R739 Hyperglycemia, unspecified: Secondary | ICD-10-CM | POA: Diagnosis not present

## 2019-05-18 DIAGNOSIS — I351 Nonrheumatic aortic (valve) insufficiency: Secondary | ICD-10-CM

## 2019-05-18 LAB — LIPID PANEL
Cholesterol: 112 mg/dL (ref 0–200)
HDL: 55 mg/dL (ref >40)
LDL Cholesterol: 49 mg/dL (ref 0–99)
Total CHOL/HDL Ratio: 2 RATIO
Triglycerides: 38 mg/dL (ref <150)
VLDL: 8 mg/dL (ref 0–40)

## 2019-05-18 LAB — URINALYSIS, COMPLETE (UACMP) WITH MICROSCOPIC
Bilirubin Urine: NEGATIVE
Glucose, UA: 50 mg/dL — AB
Hgb urine dipstick: NEGATIVE
Ketones, ur: NEGATIVE mg/dL
Leukocytes,Ua: NEGATIVE
Nitrite: NEGATIVE
Protein, ur: NEGATIVE mg/dL
Specific Gravity, Urine: 1.02 (ref 1.005–1.030)
pH: 8 (ref 5.0–8.0)

## 2019-05-18 LAB — GLUCOSE, CAPILLARY
Glucose-Capillary: 134 mg/dL — ABNORMAL HIGH (ref 70–99)
Glucose-Capillary: 149 mg/dL — ABNORMAL HIGH (ref 70–99)
Glucose-Capillary: 86 mg/dL (ref 70–99)

## 2019-05-18 LAB — MAGNESIUM: Magnesium: 2 mg/dL (ref 1.7–2.4)

## 2019-05-18 LAB — BASIC METABOLIC PANEL
Anion gap: 7 (ref 5–15)
BUN: 11 mg/dL (ref 6–20)
CO2: 25 mmol/L (ref 22–32)
Calcium: 8.7 mg/dL — ABNORMAL LOW (ref 8.9–10.3)
Chloride: 105 mmol/L (ref 98–111)
Creatinine, Ser: 0.91 mg/dL (ref 0.61–1.24)
GFR calc Af Amer: >60 mL/min (ref >60)
GFR calc non Af Amer: >60 mL/min (ref >60)
Glucose, Bld: 172 mg/dL — ABNORMAL HIGH (ref 70–99)
Potassium: 4 mmol/L (ref 3.5–5.1)
Sodium: 137 mmol/L (ref 135–145)

## 2019-05-18 LAB — CBC
HCT: 37.2 % — ABNORMAL LOW (ref 39.0–52.0)
Hemoglobin: 12.2 g/dL — ABNORMAL LOW (ref 13.0–17.0)
MCH: 30.6 pg (ref 26.0–34.0)
MCHC: 32.8 g/dL (ref 30.0–36.0)
MCV: 93.2 fL (ref 80.0–100.0)
Platelets: 156 10*3/uL (ref 150–400)
RBC: 3.99 MIL/uL — ABNORMAL LOW (ref 4.22–5.81)
RDW: 13.2 % (ref 11.5–15.5)
WBC: 4.4 10*3/uL (ref 4.0–10.5)
nRBC: 0 % (ref 0.0–0.2)

## 2019-05-18 LAB — SARS CORONAVIRUS 2 (TAT 6-24 HRS): SARS Coronavirus 2: NEGATIVE

## 2019-05-18 LAB — PHOSPHORUS: Phosphorus: 2.1 mg/dL — ABNORMAL LOW (ref 2.5–4.6)

## 2019-05-18 LAB — HEMOGLOBIN A1C
Hgb A1c MFr Bld: 5.8 % — ABNORMAL HIGH (ref 4.8–5.6)
Mean Plasma Glucose: 119.76 mg/dL

## 2019-05-18 LAB — HIV ANTIBODY (ROUTINE TESTING W REFLEX): HIV Screen 4th Generation wRfx: NONREACTIVE

## 2019-05-18 LAB — TSH: TSH: 0.516 u[IU]/mL (ref 0.350–4.500)

## 2019-05-18 IMAGING — CT CT ANGIOGRAPHY NECK
2 of 7 series · 8 of 33 positions shown · IV contrast (omnipaque)
Comparison: Prior head CT from [DATE].

CLINICAL DATA: Initial evaluation for new onset right-sided
weakness.

EXAM:
CT ANGIOGRAPHY HEAD AND NECK
TECHNIQUE: Multidetector CT imaging of the head and neck was performed using
the standard protocol during bolus administration of intravenous
contrast. Multiplanar CT image reconstructions and MIPs were
obtained to evaluate the vascular anatomy. Carotid stenosis
measurements (when applicable) are obtained utilizing NASCET
criteria, using the distal internal carotid diameter as the
denominator.
CONTRAST:  75mL OMNIPAQUE IOHEXOL 350 MG/ML SOLN

[Series 5: cta neck/head · axial · 0.44mm/px · z∈[-679,-573]mm · 2 of 159 slices shown]
[im 53/159  soft-tissue]
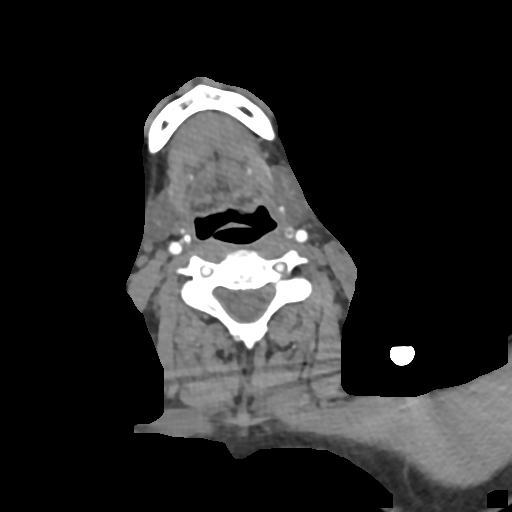
[im 106/159  soft-tissue]
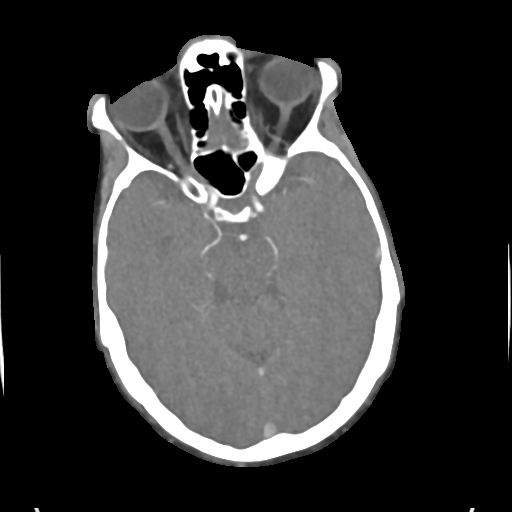

[Series 7: ax thins · axial · 0.39mm/px · z∈[-738,-512]mm · 6 of 318 slices shown]
[im 46/318  soft-tissue]
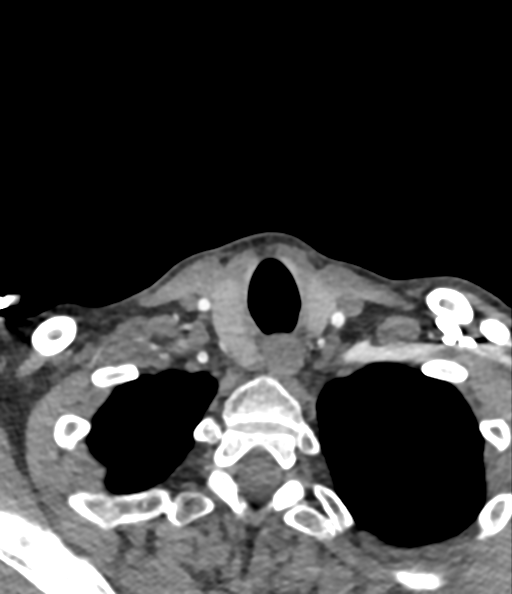
[im 91/318  bone]
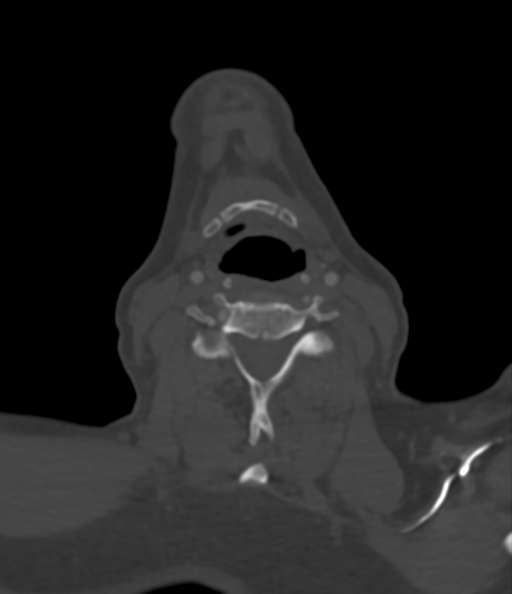
[im 136/318  soft-tissue]
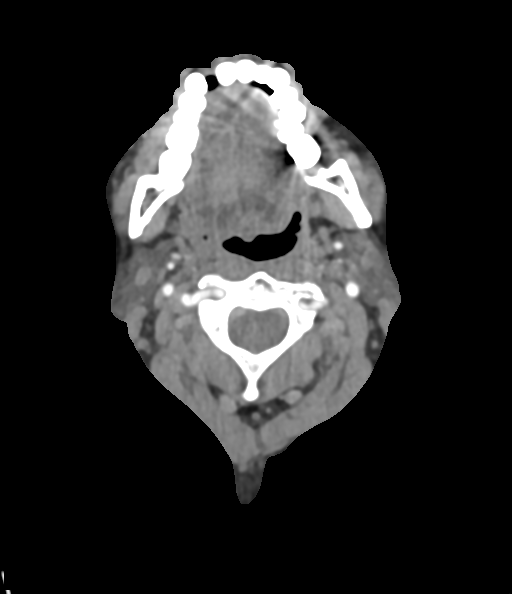
[im 182/318  bone]
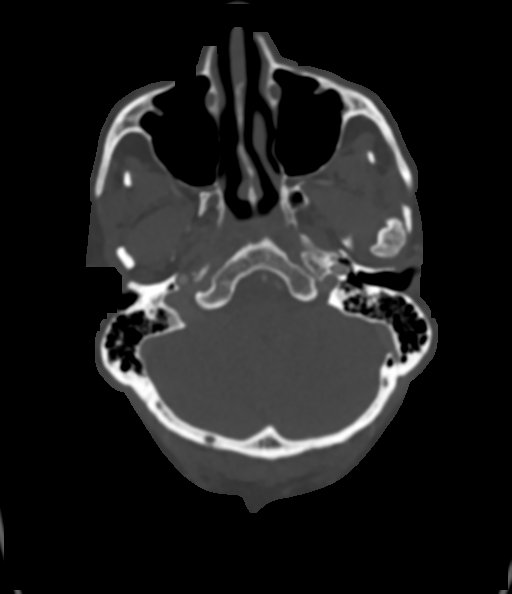
[im 227/318  soft-tissue]
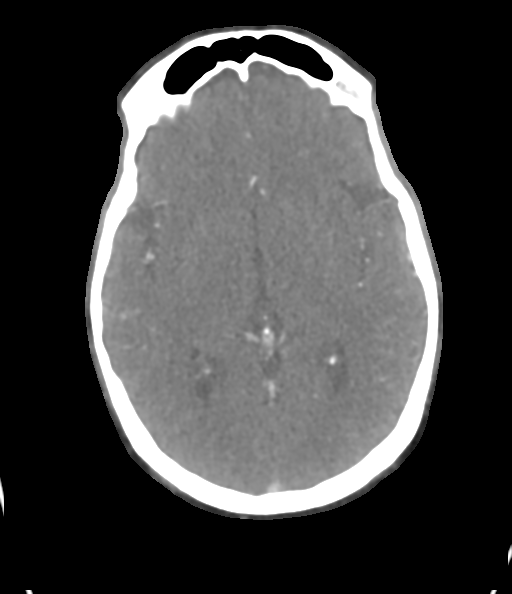
[im 272/318  bone]
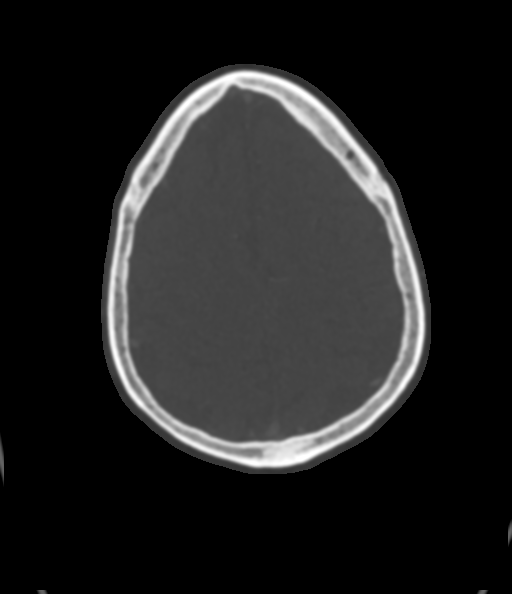

[8 of 33 positions shown; findings below may reference images not displayed]

FINDINGS: CTA NECK FINDINGS

Aortic arch: Partially visualized aortic arch normal in caliber.
Minor atherosclerotic change within the visualized arch itself.
Origin of the great vessels incompletely visualized. Visualized
subclavian arteries widely patent.

Right carotid system: Right common carotid artery patent from its
origin to the bifurcation without stenosis. Mild eccentric calcified
plaque at the origin of the right ICA without hemodynamically
significant stenosis. Right ICA widely patent distally without
stenosis, dissection, or occlusion.

Left carotid system: Visualized left CCA widely patent to the
bifurcation without stenosis. Mild soft plaque and irregularity
about the origin of the left ICA without hemodynamically significant
stenosis. Left ICA widely patent distally to the skull base without
stenosis, dissection, or occlusion.

Vertebral arteries: Right vertebral artery dominant, arising from
the right subclavian artery. Left vertebral artery arises separately
from the aortic arch. Vertebral arteries widely patent within the
neck without stenosis, dissection or occlusion.

Skeleton: No acute osseous finding. No discrete lytic or blastic
osseous lesions. Mild degenerative spondylolysis noted at C3-4.
Degenerative changes noted about the TMJs bilaterally. Dental OLOGO
at the right second maxillary molar.

Other neck: No other acute soft tissue abnormality within the neck.
Salivary glands within normal limits. Normal thyroid. No adenopathy.

Upper chest: Visualized upper chest demonstrates no acute finding.
Partially visualized lungs are clear.

Review of the MIP images confirms the above findings

CTA HEAD FINDINGS

Anterior circulation: Internal carotid arteries widely patent to the
termini without significant atheromatous disease or stenosis. ICA
termini well perfused. Left A1 dominant and widely patent. Right A1
hypoplastic but patent as well. Normal anterior communicating
artery. Anterior cerebral arteries widely patent to their distal
aspects without stenosis. No M1 stenosis or occlusion. Normal MCA
bifurcations. Distal MCA branches well perfused and symmetric.

Posterior circulation: Vertebral arteries widely patent to the
vertebrobasilar junction without stenosis. Posterior inferior
cerebellar arteries patent bilaterally. Basilar widely patent to its
distal aspect without stenosis. Superior cerebral arteries patent
bilaterally. Left PCA supplied via the basilar. Right PCA supplied
via a hypoplastic right P1 segment as well as a prominent right
posterior communicating artery. PCAs widely patent to their distal
aspects without stenosis.

Venous sinuses: Patent.

Anatomic variants: Hypoplastic right A1 segment. Dominant right
vertebral artery. Hypoplastic right P1 with robust right posterior
communicating artery. No intracranial aneurysm.

Review of the MIP images confirms the above findings
IMPRESSION: 1. Negative CTA with no evidence for large vessel occlusion.
2. Mild atherosclerotic change about the carotid bifurcations
without significant stenosis.
3. Otherwise negative CTA of the head and neck. No hemodynamically
significant or correctable stenosis. No other acute vascular
abnormality identified.

## 2019-05-18 MED ORDER — ASPIRIN 81 MG PO CHEW: 81.0000 mg | CHEWABLE_TABLET | Freq: Every day | ORAL | Status: DC

## 2019-05-18 MED ORDER — CLOPIDOGREL BISULFATE 75 MG PO TABS: 75.0000 mg | ORAL_TABLET | Freq: Every day | ORAL | Status: DC

## 2019-05-18 MED ORDER — TRAZODONE HCL 100 MG PO TABS: 100.0000 mg | ORAL_TABLET | Freq: Every day | ORAL | Status: DC

## 2019-05-18 MED ORDER — CLOPIDOGREL BISULFATE 75 MG PO TABS
300.0000 mg | ORAL_TABLET | Freq: Once | ORAL | Status: AC
  Administered 2019-05-18: 300 mg via ORAL
  Filled 2019-05-18: qty 4

## 2019-05-18 MED ORDER — SODIUM CHLORIDE 0.9 % IV SOLN
INTRAVENOUS | Status: DC
  Administered 2019-05-18: 02:00:00 via INTRAVENOUS

## 2019-05-18 MED ORDER — ASPIRIN 81 MG PO CHEW
81.0000 mg | CHEWABLE_TABLET | Freq: Every day | ORAL | Status: DC
  Administered 2019-05-18: 81 mg via ORAL
  Filled 2019-05-18: qty 1

## 2019-05-18 MED ORDER — TRAZODONE HCL 50 MG PO TABS
50.0000 mg | ORAL_TABLET | Freq: Once | ORAL | Status: AC
  Administered 2019-05-18: 01:00:00 50 mg via ORAL
  Filled 2019-05-18: qty 1

## 2019-05-18 MED ORDER — BACLOFEN 10 MG PO TABS
10.0000 mg | ORAL_TABLET | Freq: Every day | ORAL | Status: DC | PRN
  Administered 2019-05-18: 10 mg via ORAL
  Filled 2019-05-18: qty 1

## 2019-05-18 MED ORDER — ENOXAPARIN SODIUM 40 MG/0.4ML ~~LOC~~ SOLN
40.0000 mg | Freq: Every day | SUBCUTANEOUS | Status: DC
  Administered 2019-05-18: 40 mg via SUBCUTANEOUS
  Filled 2019-05-18: qty 0.4

## 2019-05-18 MED ORDER — IOHEXOL 350 MG/ML SOLN
75.0000 mL | Freq: Once | INTRAVENOUS | Status: AC | PRN
  Administered 2019-05-18: 75 mL via INTRAVENOUS

## 2019-05-18 MED ORDER — ATORVASTATIN CALCIUM 80 MG PO TABS
80.0000 mg | ORAL_TABLET | Freq: Every day | ORAL | Status: DC
  Administered 2019-05-18: 80 mg via ORAL
  Filled 2019-05-18: qty 1

## 2019-05-18 MED ORDER — TAPENTADOL HCL 50 MG PO TABS
75.0000 mg | ORAL_TABLET | Freq: Four times a day (QID) | ORAL | Status: DC
  Administered 2019-05-18 (×2): 75 mg via ORAL
  Filled 2019-05-18 (×2): qty 2

## 2019-05-18 MED ORDER — SERTRALINE HCL 100 MG PO TABS
100.0000 mg | ORAL_TABLET | Freq: Every day | ORAL | Status: DC
  Administered 2019-05-18: 100 mg via ORAL
  Filled 2019-05-18: qty 1

## 2019-05-18 MED ORDER — CLOPIDOGREL BISULFATE 75 MG PO TABS: 75.0000 mg | ORAL_TABLET | Freq: Every day | ORAL | 2 refills | Status: AC

## 2019-05-18 MED ORDER — ALBUTEROL SULFATE (2.5 MG/3ML) 0.083% IN NEBU: 2.5000 mL | INHALATION_SOLUTION | RESPIRATORY_TRACT | Status: DC | PRN

## 2019-05-18 MED ORDER — ATORVASTATIN CALCIUM 80 MG PO TABS: 40.0000 mg | ORAL_TABLET | Freq: Every day | ORAL | 0 refills | Status: DC

## 2019-05-18 MED ORDER — TOPIRAMATE 100 MG PO TABS: 100.0000 mg | ORAL_TABLET | Freq: Every day | ORAL | Status: DC

## 2019-05-18 NOTE — TOC Transition Note (Signed)
Transition of Care [facility]) - CM/SW Discharge Note   Patient Details  Name: Ryan Nicholson MRN: [id number] Date of Birth: 1961/07/10  [facility] CM/SW Contact:  Pollie Friar, RN Phone Number: 05/18/2019, 2:44 PM   Clinical Narrative:    Pt discharging home with self care. No f/u per PT and no DME needs.  Pt has hospital f/u, insurance and transportation home.   Final next level of care: Home/Self Care Barriers to Discharge: No Barriers Identified   Patient Goals and CMS Choice        Discharge Placement                       Discharge Plan and Services                                     Social Determinants of Health (SDOH) Interventions     Readmission Risk Interventions No flowsheet data found.

## 2019-05-18 NOTE — Progress Notes (Signed)
  Echocardiogram 2D Echocardiogram has been performed.  Adithya Difrancesco G Rosann Gorum 05/18/2019, 1:37 PM

## 2019-05-18 NOTE — Progress Notes (Signed)
OT Cancellation Note  Patient Details Name: Ryan Nicholson MRN: [id number] DOB: 04-19-1961   Cancelled Treatment:    Reason Eval/Treat Not Completed: Patient at procedure or test/ unavailable. Pt current in ECHO.  [place], OTR/L Acute Rehab Services Pager [phone] Office [phone]     Almon Register 05/18/2019, 2:03 PM

## 2019-05-18 NOTE — Plan of Care (Signed)
Plan of care adequate for discharge.

## 2019-05-18 NOTE — Consult Note (Signed)
Neurology Consultation Reason for Consult: Right sided weakness Referring Physician: Ward, J  CC: Right-sided weakness  History is obtained from: Patient  HPI: Ryan Nicholson is a 58 y.o. male with a history of hypercholesterolemia, B12 deficiency, stroke who presents with right-sided weakness that was present on awakening this morning.  He states that on awakening, he noticed that the right side of his face was numb and then he got up and was very off balance.  He notices right-sided weakness and numbness which has been gradually improving over the course of the day.  He still has significant symptoms at this time, over.   LKW: 10 PM 8/19 tpa given?: no, outside of window    ROS: A 14 point ROS was performed and is negative except as noted in the HPI.   Past Medical History:  Diagnosis Date  . B12 deficiency   . Dizziness   . DJD (degenerative joint disease)   . Gastric ulcer    hx of  . GERD (gastroesophageal reflux disease)   . Hypercholesteremia   . Migraine   . Post herpetic neuralgia   . Stroke [facility])      Family History  Problem Relation Age of Onset  . Breast cancer Mother   . Diabetes Father   . Hypertension Father   . Stroke Father   . Parkinson's disease Father   . Brain cancer Maternal Uncle      Social History:  reports that he quit smoking about 18 years ago. He has never used smokeless tobacco. He reports that he does not drink alcohol or use drugs.   Exam: Current vital signs: BP (!) 100/54 (BP Location: Right Arm)   Pulse 62   Temp 97.9 F (36.6 C) (Oral)   Resp 16   Ht 5\' 7"  (1.702 m)   Wt 71.8 kg   SpO2 99%   BMI 24.79 kg/m  Vital signs in last 24 hours: Temp:  [97.8 F (36.6 C)-97.9 F (36.6 C)] 97.9 F (36.6 C) (08/21 0009) Pulse Rate:  [52-81] 62 (08/21 0009) Resp:  [11-17] 16 (08/21 0009) BP: (91-110)/(54-84) 100/54 (08/21 0009) SpO2:  [98 %-100 %] 99 % (08/21 0009) Weight:  [71.8 kg] 71.8 kg (08/20 2300)   Physical Exam   Constitutional: Appears well-developed and well-nourished.  Psych: Affect appropriate to situation Eyes: No scleral injection HENT: No OP obstrucion Head: Normocephalic.  Cardiovascular: Normal rate and regular rhythm.  Respiratory: Effort normal, non-labored breathing GI: Soft.  No distension. There is no tenderness.  Skin: WDI  Neuro: Mental Status: Patient is awake, alert, oriented to person, place, month, year, and situation. Patient is able to give a clear and coherent history. No signs of aphasia or neglect Cranial Nerves: II: Visual Fields are full. Pupils are equal, round, and reactive to light.   III,IV, VI: EOMI without ptosis or diploplia.  V: Facial sensation is symmetric to temperature VII: Facial movement is symmetric.  VIII: hearing is intact to voice X: Uvula elevates symmetrically XI: Shoulder shrug is symmetric. XII: tongue is midline without atrophy or fasciculations.  Motor: Tone is normal. Bulk is normal.  He has 4/5 weakness of the right arm and leg Sensory: Sensation is symmetric to light touch and temperature in the arms and temperature is decreased in the right leg Cerebellar: FNF and HKS are intact on the left, slow on the right   I have reviewed labs in epic and the results pertinent to this consultation are: Creatinine 1.1  I  have reviewed the images obtained: CT head-unremarkable  Impression: 58 year old male with what I suspect is a small vessel ischemic infarct.  Due to spinal stimulator, he is unlikely to be able to get an MRI, but I would treat this as ischemic infarct at this time.  He is being admitted for secondary risk modification and physical therapy.  Recommendations: - HgbA1c, fasting lipid panel - MRI, MRA  of the brain without contrast - Frequent neuro checks - Echocardiogram - Carotid dopplers - Prophylactic therapy-Antiplatelet med: Aspirin -81 mg and Plavix 75 mg daily following 300 mg load for 3 weeks - Risk factor  modification - Telemetry monitoring - PT consult, OT consult, Speech consult - Stroke team to follow    Ritta SlotMcNeill Akeylah Hendel, MD [facility] [phone]  If 7pm- 7am, please page neurology on call as listed in AMION.

## 2019-05-18 NOTE — Discharge Summary (Signed)
Physician Discharge Summary  Kevan NyCraig A Kunda ZOX:[id number] DOB: 08-24-1961 DOA: 05/17/2019  PCP: Frederich ChickFoster, Robert M Jr., MD  Admit date: 05/17/2019 Discharge date: 05/18/2019  Admitted From: home  Disposition:  Home   Recommendations for Outpatient Follow-up:  1. Follow up with PCP in 1-2 weeks   Home Health:NA  Equipment/Devices:NA   Discharge Condition:stable   CODE STATUS:full code  Diet recommendation: low salt diet   Discharge summary:  58 year old man with history of migraines, hyperlipidemia, chronic pain problems and spinal disease, B12 recency, a spinal cord stimulator who presented to the emergency room after waking up in the morning with inability to control the right side of the body and weakness on the right side.  No associated speech problem.  Also complained of numbness on the right side of the head.  CT head was normal.  EKG normal.  Hemodynamically stable.  By the time patient was in the ER, his symptoms gradually improved.  Patient was monitored in the hospital, evaluated for a stroke, presumed TIA: CT scan of the head, CTA of the head and neck normal, now with no neurological deficit, 2D echocardiogram normal.  Could not have MRI of the brain because of presence of spinal cord stimulator. Seen by neurology and recommended: -Dual antiplatelet therapy with aspirin and Plavix for 3 weeks, then Plavix alone -LDL was 49, on atorvastatin, increased dose to 80 mg. -Hemoglobin A1c 5.8.  TSH 0.51.  Seen by therapies.  Currently with no neurological deficit.  Treated as presumed TIA and discharged home in a stable condition.  Discharge Diagnoses:  Active Problems:   TIA (transient ischemic attack)    Discharge Instructions  Discharge Instructions    Diet - low sodium heart healthy   Complete by: As directed    Discharge instructions   Complete by: As directed    Stop your aspirin after 3 weeks and only continue taking plavix Does of your atorvastatin has been  increased   Increase activity slowly   Complete by: As directed      Allergies as of 05/18/2019      Reactions   Oxycontin [oxycodone] Other (See Comments)   Unknown   Reglan [metoclopramide] Nausea Only   shakey      Medication List    TAKE these medications   albuterol 108 (90 Base) MCG/ACT inhaler Commonly known as: VENTOLIN HFA Inhale 2 puffs into the lungs every 4 (four) hours as needed for wheezing or shortness of breath.   aspirin 81 MG chewable tablet Chew 81 mg by mouth daily.   atorvastatin 80 MG tablet Commonly known as: LIPITOR Take 0.5 tablets (40 mg total) by mouth at bedtime. What changed: medication strength   baclofen 10 MG tablet Commonly known as: LIORESAL Take 10 mg by mouth daily as needed for muscle spasms.   clopidogrel 75 MG tablet Commonly known as: PLAVIX Take 1 tablet (75 mg total) by mouth daily. Start taking on: May 19, 2019   cyanocobalamin 1000 MCG/ML injection Commonly known as: (VITAMIN B-12) Inject 1 mL (1,000 mcg total) into the muscle every 30 (thirty) days.   fluticasone 50 MCG/ACT nasal spray Commonly known as: FLONASE Place 2 sprays into both nostrils daily.   montelukast 4 MG Pack Commonly known as: SINGULAIR Take 4 mg by mouth daily.   MULTIVITAMIN/MINERALS PO Take 1 tablet by mouth daily.   Nucynta 75 MG tablet Generic drug: tapentadol HCl Take 75 mg by mouth 4 (four) times daily.   ondansetron 8 MG tablet  Commonly known as: ZOFRAN Take 1 tablet (8 mg total) by mouth every 8 (eight) hours as needed for nausea or vomiting.   sertraline 100 MG tablet Commonly known as: ZOLOFT Take 100 mg by mouth daily.   SUMAtriptan 100 MG tablet Commonly known as: IMITREX Take 100 mg by mouth every 2 (two) hours as needed for migraine. May repeat in 2 hours if headache persists or recurs.   topiramate 100 MG tablet Commonly known as: TOPAMAX Take 100 mg by mouth at bedtime.   traZODone 50 MG tablet Commonly known as:  DESYREL Take 100 mg by mouth at bedtime.       Allergies  Allergen Reactions  . Oxycontin [Oxycodone] Other (See Comments)    Unknown  . Reglan [Metoclopramide] Nausea Only    shakey    Consultations:  Neurology    Procedures/Studies: Ct Angio Head W Or Wo Contrast  Result Date: 05/18/2019 CLINICAL DATA:  Initial evaluation for new onset right-sided weakness. EXAM: CT ANGIOGRAPHY HEAD AND NECK TECHNIQUE: Multidetector CT imaging of the head and neck was performed using the standard protocol during bolus administration of intravenous contrast. Multiplanar CT image reconstructions and MIPs were obtained to evaluate the vascular anatomy. Carotid stenosis measurements (when applicable) are obtained utilizing NASCET criteria, using the distal internal carotid diameter as the denominator. CONTRAST:  38mL OMNIPAQUE IOHEXOL 350 MG/ML SOLN COMPARISON:  Prior head CT from 05/17/2019. FINDINGS: CTA NECK FINDINGS Aortic arch: Partially visualized aortic arch normal in caliber. Minor atherosclerotic change within the visualized arch itself. Origin of the great vessels incompletely visualized. Visualized subclavian arteries widely patent. Right carotid system: Right common carotid artery patent from its origin to the bifurcation without stenosis. Mild eccentric calcified plaque at the origin of the right ICA without hemodynamically significant stenosis. Right ICA widely patent distally without stenosis, dissection, or occlusion. Left carotid system: Visualized left CCA widely patent to the bifurcation without stenosis. Mild soft plaque and irregularity about the origin of the left ICA without hemodynamically significant stenosis. Left ICA widely patent distally to the skull base without stenosis, dissection, or occlusion. Vertebral arteries: Right vertebral artery dominant, arising from the right subclavian artery. Left vertebral artery arises separately from the aortic arch. Vertebral arteries widely patent  within the neck without stenosis, dissection or occlusion. Skeleton: No acute osseous finding. No discrete lytic or blastic osseous lesions. Mild degenerative spondylolysis noted at C3-4. Degenerative changes noted about the TMJs bilaterally. Dental carie at the right second maxillary molar. Other neck: No other acute soft tissue abnormality within the neck. Salivary glands within normal limits. Normal thyroid. No adenopathy. Upper chest: Visualized upper chest demonstrates no acute finding. Partially visualized lungs are clear. Review of the MIP images confirms the above findings CTA HEAD FINDINGS Anterior circulation: Internal carotid arteries widely patent to the termini without significant atheromatous disease or stenosis. ICA termini well perfused. Left A1 dominant and widely patent. Right A1 hypoplastic but patent as well. Normal anterior communicating artery. Anterior cerebral arteries widely patent to their distal aspects without stenosis. No M1 stenosis or occlusion. Normal MCA bifurcations. Distal MCA branches well perfused and symmetric. Posterior circulation: Vertebral arteries widely patent to the vertebrobasilar junction without stenosis. Posterior inferior cerebellar arteries patent bilaterally. Basilar widely patent to its distal aspect without stenosis. Superior cerebral arteries patent bilaterally. Left PCA supplied via the basilar. Right PCA supplied via a hypoplastic right P1 segment as well as a prominent right posterior communicating artery. PCAs widely patent to their distal aspects without stenosis.  Venous sinuses: Patent. Anatomic variants: Hypoplastic right A1 segment. Dominant right vertebral artery. Hypoplastic right P1 with robust right posterior communicating artery. No intracranial aneurysm. Review of the MIP images confirms the above findings IMPRESSION: 1. Negative CTA with no evidence for large vessel occlusion. 2. Mild atherosclerotic change about the carotid bifurcations without  significant stenosis. 3. Otherwise negative CTA of the head and neck. No hemodynamically significant or correctable stenosis. No other acute vascular abnormality identified. Electronically Signed   By: Rise Mu M.D.   On: 05/18/2019 02:06   Ct Head Wo Contrast  Result Date: 05/17/2019 CLINICAL DATA:  Right-sided weakness and facial numbness. Short-term memory loss EXAM: CT HEAD WITHOUT CONTRAST TECHNIQUE: Contiguous axial images were obtained from the base of the skull through the vertex without intravenous contrast. COMPARISON:  None. FINDINGS: Brain: Ventricles and sulci are within normal limits for age. There is no intracranial mass, hemorrhage, extra-axial fluid collection, or midline shift. Brain parenchyma appears unremarkable. No acute infarct evident. Vascular: There is no hyperdense vessel. No appreciable vascular calcification. Skull: The bony calvarium appears intact. Sinuses/Orbits: There is mild mucosal thickening in several ethmoid air cells. Other visualized paranasal sinuses are clear. Orbits appear symmetric bilaterally except for apparent cataract removal on the right. Other: Mastoid air cells are clear. IMPRESSION: Suspect previous cataract removal on the right. Mucosal thickening noted in several ethmoid air cells. Brain parenchyma appears unremarkable. No evident acute infarct. No mass or hemorrhage. Electronically Signed   By: Bretta Bang III M.D.   On: 05/17/2019 16:47   Ct Angio Neck W Or Wo Contrast  Result Date: 05/18/2019 CLINICAL DATA:  Initial evaluation for new onset right-sided weakness. EXAM: CT ANGIOGRAPHY HEAD AND NECK TECHNIQUE: Multidetector CT imaging of the head and neck was performed using the standard protocol during bolus administration of intravenous contrast. Multiplanar CT image reconstructions and MIPs were obtained to evaluate the vascular anatomy. Carotid stenosis measurements (when applicable) are obtained utilizing NASCET criteria, using the  distal internal carotid diameter as the denominator. CONTRAST:  75mL OMNIPAQUE IOHEXOL 350 MG/ML SOLN COMPARISON:  Prior head CT from 05/17/2019. FINDINGS: CTA NECK FINDINGS Aortic arch: Partially visualized aortic arch normal in caliber. Minor atherosclerotic change within the visualized arch itself. Origin of the great vessels incompletely visualized. Visualized subclavian arteries widely patent. Right carotid system: Right common carotid artery patent from its origin to the bifurcation without stenosis. Mild eccentric calcified plaque at the origin of the right ICA without hemodynamically significant stenosis. Right ICA widely patent distally without stenosis, dissection, or occlusion. Left carotid system: Visualized left CCA widely patent to the bifurcation without stenosis. Mild soft plaque and irregularity about the origin of the left ICA without hemodynamically significant stenosis. Left ICA widely patent distally to the skull base without stenosis, dissection, or occlusion. Vertebral arteries: Right vertebral artery dominant, arising from the right subclavian artery. Left vertebral artery arises separately from the aortic arch. Vertebral arteries widely patent within the neck without stenosis, dissection or occlusion. Skeleton: No acute osseous finding. No discrete lytic or blastic osseous lesions. Mild degenerative spondylolysis noted at C3-4. Degenerative changes noted about the TMJs bilaterally. Dental carie at the right second maxillary molar. Other neck: No other acute soft tissue abnormality within the neck. Salivary glands within normal limits. Normal thyroid. No adenopathy. Upper chest: Visualized upper chest demonstrates no acute finding. Partially visualized lungs are clear. Review of the MIP images confirms the above findings CTA HEAD FINDINGS Anterior circulation: Internal carotid arteries widely patent to the  termini without significant atheromatous disease or stenosis. ICA termini well  perfused. Left A1 dominant and widely patent. Right A1 hypoplastic but patent as well. Normal anterior communicating artery. Anterior cerebral arteries widely patent to their distal aspects without stenosis. No M1 stenosis or occlusion. Normal MCA bifurcations. Distal MCA branches well perfused and symmetric. Posterior circulation: Vertebral arteries widely patent to the vertebrobasilar junction without stenosis. Posterior inferior cerebellar arteries patent bilaterally. Basilar widely patent to its distal aspect without stenosis. Superior cerebral arteries patent bilaterally. Left PCA supplied via the basilar. Right PCA supplied via a hypoplastic right P1 segment as well as a prominent right posterior communicating artery. PCAs widely patent to their distal aspects without stenosis. Venous sinuses: Patent. Anatomic variants: Hypoplastic right A1 segment. Dominant right vertebral artery. Hypoplastic right P1 with robust right posterior communicating artery. No intracranial aneurysm. Review of the MIP images confirms the above findings IMPRESSION: 1. Negative CTA with no evidence for large vessel occlusion. 2. Mild atherosclerotic change about the carotid bifurcations without significant stenosis. 3. Otherwise negative CTA of the head and neck. No hemodynamically significant or correctable stenosis. No other acute vascular abnormality identified. Electronically Signed   By: Rise MuBenjamin  McClintock M.D.   On: 05/18/2019 02:06       Subjective: Patient seen and examined in the morning.  Again examined him for readiness of discharge.  Denies any complaints.  He thinks his stress might have aggravated the symptoms.  Going through a lot of stress with their adopted 254-year-old daughter.   Discharge Exam: Vitals:   05/18/19 0923 05/18/19 1219  BP: 127/89 111/74  Pulse: (!) 55 71  Resp: 16 15  Temp: 98.6 F (37 C) (!) 97.5 F (36.4 C)  SpO2: 100% 99%   Vitals:   05/18/19 0209 05/18/19 0429 05/18/19 0923  05/18/19 1219  BP: (!) 90/58 99/61 127/89 111/74  Pulse: 64 (!) 51 (!) 55 71  Resp:  16 16 15   Temp:  98 F (36.7 C) 98.6 F (37 C) (!) 97.5 F (36.4 C)  TempSrc:   Oral Oral  SpO2: 97% 100% 100% 99%  Weight:      Height:        General: Pt is alert, awake, not in acute distress Cardiovascular: RRR, S1/S2 +, no rubs, no gallops Respiratory: CTA bilaterally, no wheezing, no rhonchi Abdominal: Soft, NT, ND, bowel sounds + Extremities: no edema, no cyanosis    The results of significant diagnostics from this hospitalization (including imaging, microbiology, ancillary and laboratory) are listed below for reference.     Microbiology: Recent Results (from the past 240 hour(s))  SARS CORONAVIRUS 2 Nasal Swab Aptima Multi Swab     Status: None   Collection Time: 05/17/19 11:45 PM   Specimen: Aptima Multi Swab; Nasal Swab  Result Value Ref Range Status   SARS Coronavirus 2 NEGATIVE NEGATIVE Final    Comment: (NOTE) SARS-CoV-2 target nucleic acids are NOT DETECTED. The SARS-CoV-2 RNA is generally detectable in upper and lower respiratory specimens during the acute phase of infection. Negative results do not preclude SARS-CoV-2 infection, do not rule out co-infections with other pathogens, and should not be used as the sole basis for treatment or other patient management decisions. Negative results must be combined with clinical observations, patient history, and epidemiological information. The expected result is Negative. Fact Sheet for Patients: HairSlick.nohttps://www.fda.gov/media/138098/download Fact Sheet for Healthcare Providers: quierodirigir.comhttps://www.fda.gov/media/138095/download This test is not yet approved or cleared by the [place] FDA and  has been authorized for detection  and/or diagnosis of SARS-CoV-2 by FDA under an Emergency Use Authorization (EUA). This EUA will remain  in effect (meaning this test can be used) for the duration of the COVID-19 declaration under Section  56 4(b)(1) of the Act, 21 U.S.C. section 360bbb-3(b)(1), unless the authorization is terminated or revoked sooner. Performed at [facility], [address] [place], [place] [address]      Labs: BNP (last 3 results) No results for input(s): BNP in the last 8760 hours. Basic Metabolic Panel: Recent Labs  Lab 05/17/19 1533 05/17/19 1549 05/18/19 0140  NA 138 140 137  K 4.6 4.6 4.0  CL 106 107 105  CO2 25  --  25  GLUCOSE 210* 200* 172*  BUN 12 13 11   CREATININE 1.13 0.90 0.91  CALCIUM 9.1  --  8.7*  MG  --   --  2.0  PHOS  --   --  2.1*   Liver Function Tests: Recent Labs  Lab 05/17/19 1533  AST 27  ALT 21  ALKPHOS 59  BILITOT 0.6  PROT 6.5  ALBUMIN 3.5   No results for input(s): LIPASE, AMYLASE in the last 168 hours. No results for input(s): AMMONIA in the last 168 hours. CBC: Recent Labs  Lab 05/17/19 1533 05/17/19 1549 05/18/19 0140  WBC 4.3  --  4.4  NEUTROABS 2.5  --   --   HGB 13.0 13.6 12.2*  HCT 40.5 40.0 37.2*  MCV 94.4  --  93.2  PLT 165  --  156   Cardiac Enzymes: No results for input(s): CKTOTAL, CKMB, CKMBINDEX, TROPONINI in the last 168 hours. BNP: Invalid input(s): POCBNP CBG: Recent Labs  Lab 05/18/19 0030 05/18/19 0625 05/18/19 1213  GLUCAP 149* 86 134*   D-Dimer No results for input(s): DDIMER in the last 72 hours. Hgb A1c Recent Labs    05/18/19 0140  HGBA1C 5.8*   Lipid Profile Recent Labs    05/18/19 0140  CHOL 112  HDL 55  LDLCALC 49  TRIG 38  CHOLHDL 2.0   Thyroid function studies Recent Labs    05/18/19 0140  TSH 0.516   Anemia work up No results for input(s): VITAMINB12, FOLATE, FERRITIN, TIBC, IRON, RETICCTPCT in the last 72 hours. Urinalysis    Component Value Date/Time   COLORURINE AMBER (A) 05/18/2019 1345   APPEARANCEUR HAZY (A) 05/18/2019 1345   LABSPEC 1.020 05/18/2019 1345   PHURINE 8.0 05/18/2019 1345   GLUCOSEU 50 (A) 05/18/2019 1345   HGBUR NEGATIVE 05/18/2019 1345    BILIRUBINUR NEGATIVE 05/18/2019 1345   KETONESUR NEGATIVE 05/18/2019 1345   PROTEINUR NEGATIVE 05/18/2019 1345   NITRITE NEGATIVE 05/18/2019 1345   LEUKOCYTESUR NEGATIVE 05/18/2019 1345   Sepsis Labs Invalid input(s): PROCALCITONIN,  WBC,  LACTICIDVEN Microbiology Recent Results (from the past 240 hour(s))  SARS CORONAVIRUS 2 Nasal Swab Aptima Multi Swab     Status: None   Collection Time: 05/17/19 11:45 PM   Specimen: Aptima Multi Swab; Nasal Swab  Result Value Ref Range Status   SARS Coronavirus 2 NEGATIVE NEGATIVE Final    Comment: (NOTE) SARS-CoV-2 target nucleic acids are NOT DETECTED. The SARS-CoV-2 RNA is generally detectable in upper and lower respiratory specimens during the acute phase of infection. Negative results do not preclude SARS-CoV-2 infection, do not rule out co-infections with other pathogens, and should not be used as the sole basis for treatment or other patient management decisions. Negative results must be combined with clinical observations, patient history, and epidemiological information.  The expected result is Negative. Fact Sheet for Patients: HairSlick.no Fact Sheet for Healthcare Providers: quierodirigir.com This test is not yet approved or cleared by the [place] FDA and  has been authorized for detection and/or diagnosis of SARS-CoV-2 by FDA under an Emergency Use Authorization (EUA). This EUA will remain  in effect (meaning this test can be used) for the duration of the COVID-19 declaration under Section 56 4(b)(1) of the Act, 21 U.S.C. section 360bbb-3(b)(1), unless the authorization is terminated or revoked sooner. Performed at [facility], [address], [place], [place] [address]      Time coordinating discharge: 25 minutes  SIGNED:   Dorcas Carrow, MD  [facility] 05/18/2019, 2:23 PM

## 2019-05-18 NOTE — Plan of Care (Signed)
Progressing towards goals

## 2019-05-18 NOTE — Progress Notes (Signed)
Patient being discharged home.  Patient to be transported by his wife.  IV removed with the catheter intact.  Discharge instructions and prescription information given to the patient who verbalized understanding. 

## 2019-05-18 NOTE — Progress Notes (Signed)
STROKE TEAM PROGRESS NOTE   INTERVAL HISTORY I have personally reviewed history of presenting illness in details and reviewed imaging films in PACS.  He presented with weakness and gait ataxia and states he is doing well this morning and is recovered almost back to baseline.  CT angiogram of the brain and neck showed no significant large vessel extracranial intracranial stenosis or definite new infarct.  He cannot have an MRI as he has a spinal cord stimulator.  Echocardiogram is pending.  Vitals:   05/18/19 0207 05/18/19 0209 05/18/19 0429 05/18/19 0923  BP: (!) 82/53 (!) 90/58 99/61 127/89  Pulse: 66 64 (!) 51 (!) 55  Resp: 18  16 16   Temp: (!) 97.3 F (36.3 C)  98 F (36.7 C) 98.6 F (37 C)  TempSrc: Oral   Oral  SpO2: 100% 97% 100% 100%  Weight:      Height:        CBC:  Recent Labs  Lab 05/17/19 1533 05/17/19 1549 05/18/19 0140  WBC 4.3  --  4.4  NEUTROABS 2.5  --   --   HGB 13.0 13.6 12.2*  HCT 40.5 40.0 37.2*  MCV 94.4  --  93.2  PLT 165  --  156    Basic Metabolic Panel:  Recent Labs  Lab 05/17/19 1533 05/17/19 1549 05/18/19 0140  NA 138 140 137  K 4.6 4.6 4.0  CL 106 107 105  CO2 25  --  25  GLUCOSE 210* 200* 172*  BUN 12 13 11   CREATININE 1.13 0.90 0.91  CALCIUM 9.1  --  8.7*  MG  --   --  2.0  PHOS  --   --  2.1*   Lipid Panel:     Component Value Date/Time   CHOL 112 05/18/2019 0140   CHOL 153 06/03/2017 1151   TRIG 38 05/18/2019 0140   HDL 55 05/18/2019 0140   HDL 64 06/03/2017 1151   CHOLHDL 2.0 05/18/2019 0140   VLDL 8 05/18/2019 0140   LDLCALC 49 05/18/2019 0140   LDLCALC 77 06/03/2017 1151   HgbA1c:  Lab Results  Component Value Date   HGBA1C 5.8 (H) 05/18/2019   Urine Drug Screen: No results found for: LABOPIA, COCAINSCRNUR, LABBENZ, AMPHETMU, THCU, LABBARB  Alcohol Level No results found for: ETH  IMAGING Ct Angio Head W Or Wo Contrast  Result Date: 05/18/2019 CLINICAL DATA:  Initial evaluation for new onset right-sided  weakness. EXAM: CT ANGIOGRAPHY HEAD AND NECK TECHNIQUE: Multidetector CT imaging of the head and neck was performed using the standard protocol during bolus administration of intravenous contrast. Multiplanar CT image reconstructions and MIPs were obtained to evaluate the vascular anatomy. Carotid stenosis measurements (when applicable) are obtained utilizing NASCET criteria, using the distal internal carotid diameter as the denominator. CONTRAST:  75mL OMNIPAQUE IOHEXOL 350 MG/ML SOLN COMPARISON:  Prior head CT from 05/17/2019. FINDINGS: CTA NECK FINDINGS Aortic arch: Partially visualized aortic arch normal in caliber. Minor atherosclerotic change within the visualized arch itself. Origin of the great vessels incompletely visualized. Visualized subclavian arteries widely patent. Right carotid system: Right common carotid artery patent from its origin to the bifurcation without stenosis. Mild eccentric calcified plaque at the origin of the right ICA without hemodynamically significant stenosis. Right ICA widely patent distally without stenosis, dissection, or occlusion. Left carotid system: Visualized left CCA widely patent to the bifurcation without stenosis. Mild soft plaque and irregularity about the origin of the left ICA without hemodynamically significant stenosis. Left ICA widely  patent distally to the skull base without stenosis, dissection, or occlusion. Vertebral arteries: Right vertebral artery dominant, arising from the right subclavian artery. Left vertebral artery arises separately from the aortic arch. Vertebral arteries widely patent within the neck without stenosis, dissection or occlusion. Skeleton: No acute osseous finding. No discrete lytic or blastic osseous lesions. Mild degenerative spondylolysis noted at C3-4. Degenerative changes noted about the TMJs bilaterally. Dental carie at the right second maxillary molar. Other neck: No other acute soft tissue abnormality within the neck. Salivary  glands within normal limits. Normal thyroid. No adenopathy. Upper chest: Visualized upper chest demonstrates no acute finding. Partially visualized lungs are clear. Review of the MIP images confirms the above findings CTA HEAD FINDINGS Anterior circulation: Internal carotid arteries widely patent to the termini without significant atheromatous disease or stenosis. ICA termini well perfused. Left A1 dominant and widely patent. Right A1 hypoplastic but patent as well. Normal anterior communicating artery. Anterior cerebral arteries widely patent to their distal aspects without stenosis. No M1 stenosis or occlusion. Normal MCA bifurcations. Distal MCA branches well perfused and symmetric. Posterior circulation: Vertebral arteries widely patent to the vertebrobasilar junction without stenosis. Posterior inferior cerebellar arteries patent bilaterally. Basilar widely patent to its distal aspect without stenosis. Superior cerebral arteries patent bilaterally. Left PCA supplied via the basilar. Right PCA supplied via a hypoplastic right P1 segment as well as a prominent right posterior communicating artery. PCAs widely patent to their distal aspects without stenosis. Venous sinuses: Patent. Anatomic variants: Hypoplastic right A1 segment. Dominant right vertebral artery. Hypoplastic right P1 with robust right posterior communicating artery. No intracranial aneurysm. Review of the MIP images confirms the above findings IMPRESSION: 1. Negative CTA with no evidence for large vessel occlusion. 2. Mild atherosclerotic change about the carotid bifurcations without significant stenosis. 3. Otherwise negative CTA of the head and neck. No hemodynamically significant or correctable stenosis. No other acute vascular abnormality identified. Electronically Signed   By: Rise MuBenjamin  McClintock M.D.   On: 05/18/2019 02:06   Ct Head Wo Contrast  Result Date: 05/17/2019 CLINICAL DATA:  Right-sided weakness and facial numbness. Short-term  memory loss EXAM: CT HEAD WITHOUT CONTRAST TECHNIQUE: Contiguous axial images were obtained from the base of the skull through the vertex without intravenous contrast. COMPARISON:  None. FINDINGS: Brain: Ventricles and sulci are within normal limits for age. There is no intracranial mass, hemorrhage, extra-axial fluid collection, or midline shift. Brain parenchyma appears unremarkable. No acute infarct evident. Vascular: There is no hyperdense vessel. No appreciable vascular calcification. Skull: The bony calvarium appears intact. Sinuses/Orbits: There is mild mucosal thickening in several ethmoid air cells. Other visualized paranasal sinuses are clear. Orbits appear symmetric bilaterally except for apparent cataract removal on the right. Other: Mastoid air cells are clear. IMPRESSION: Suspect previous cataract removal on the right. Mucosal thickening noted in several ethmoid air cells. Brain parenchyma appears unremarkable. No evident acute infarct. No mass or hemorrhage. Electronically Signed   By: Bretta BangWilliam  Woodruff III M.D.   On: 05/17/2019 16:47   Ct Angio Neck W Or Wo Contrast  Result Date: 05/18/2019 CLINICAL DATA:  Initial evaluation for new onset right-sided weakness. EXAM: CT ANGIOGRAPHY HEAD AND NECK TECHNIQUE: Multidetector CT imaging of the head and neck was performed using the standard protocol during bolus administration of intravenous contrast. Multiplanar CT image reconstructions and MIPs were obtained to evaluate the vascular anatomy. Carotid stenosis measurements (when applicable) are obtained utilizing NASCET criteria, using the distal internal carotid diameter as the denominator. CONTRAST:  20mL OMNIPAQUE IOHEXOL 350 MG/ML SOLN COMPARISON:  Prior head CT from 05/17/2019. FINDINGS: CTA NECK FINDINGS Aortic arch: Partially visualized aortic arch normal in caliber. Minor atherosclerotic change within the visualized arch itself. Origin of the great vessels incompletely visualized. Visualized  subclavian arteries widely patent. Right carotid system: Right common carotid artery patent from its origin to the bifurcation without stenosis. Mild eccentric calcified plaque at the origin of the right ICA without hemodynamically significant stenosis. Right ICA widely patent distally without stenosis, dissection, or occlusion. Left carotid system: Visualized left CCA widely patent to the bifurcation without stenosis. Mild soft plaque and irregularity about the origin of the left ICA without hemodynamically significant stenosis. Left ICA widely patent distally to the skull base without stenosis, dissection, or occlusion. Vertebral arteries: Right vertebral artery dominant, arising from the right subclavian artery. Left vertebral artery arises separately from the aortic arch. Vertebral arteries widely patent within the neck without stenosis, dissection or occlusion. Skeleton: No acute osseous finding. No discrete lytic or blastic osseous lesions. Mild degenerative spondylolysis noted at C3-4. Degenerative changes noted about the TMJs bilaterally. Dental carie at the right second maxillary molar. Other neck: No other acute soft tissue abnormality within the neck. Salivary glands within normal limits. Normal thyroid. No adenopathy. Upper chest: Visualized upper chest demonstrates no acute finding. Partially visualized lungs are clear. Review of the MIP images confirms the above findings CTA HEAD FINDINGS Anterior circulation: Internal carotid arteries widely patent to the termini without significant atheromatous disease or stenosis. ICA termini well perfused. Left A1 dominant and widely patent. Right A1 hypoplastic but patent as well. Normal anterior communicating artery. Anterior cerebral arteries widely patent to their distal aspects without stenosis. No M1 stenosis or occlusion. Normal MCA bifurcations. Distal MCA branches well perfused and symmetric. Posterior circulation: Vertebral arteries widely patent to the  vertebrobasilar junction without stenosis. Posterior inferior cerebellar arteries patent bilaterally. Basilar widely patent to its distal aspect without stenosis. Superior cerebral arteries patent bilaterally. Left PCA supplied via the basilar. Right PCA supplied via a hypoplastic right P1 segment as well as a prominent right posterior communicating artery. PCAs widely patent to their distal aspects without stenosis. Venous sinuses: Patent. Anatomic variants: Hypoplastic right A1 segment. Dominant right vertebral artery. Hypoplastic right P1 with robust right posterior communicating artery. No intracranial aneurysm. Review of the MIP images confirms the above findings IMPRESSION: 1. Negative CTA with no evidence for large vessel occlusion. 2. Mild atherosclerotic change about the carotid bifurcations without significant stenosis. 3. Otherwise negative CTA of the head and neck. No hemodynamically significant or correctable stenosis. No other acute vascular abnormality identified. Electronically Signed   By: Jeannine Boga M.D.   On: 05/18/2019 02:06    PHYSICAL EXAM Pleasant middle-aged Caucasian male currently not in distress. . Afebrile. Head is nontraumatic. Neck is supple without bruit.    Cardiac exam no murmur or gallop. Lungs are clear to auscultation. Distal pulses are well felt. Neurological Exam ;  Awake  Alert oriented x 3. Normal speech and language.eye movements full without nystagmus.fundi were not visualized. Vision acuity and fields appear normal. Hearing is normal. Palatal movements are normal. Face symmetric. Tongue midline. Normal strength, tone, reflexes and coordination. Normal sensation. Gait deferred.  ASSESSMENT/PLAN Ryan Nicholson is a 58 y.o. male with history of migraines, HLD, B12 deficiency who has a spinal cord stimulator presenting with R sided weakness upon awakening.    Stroke:   Suspect Small L infarct secondary to small vessel disease not  visualized on CT.  Cannot do MRI due to spinal cord stimulator.   CT head No acute abnormality. Previous R cataract removal. Ethmoid air cell thickening.     CTA head & neck no LVO. Mild atherosclerosis.   MRI  Unable d/t spinal cord stimulator, which he plans to remove. Once out, can do MRI. MRI not urgent  2D Echo w/ bubble  pending  LDL 49  HgbA1c 5.8  Lovenox 40 mg sq daily for VTE prophylaxis  aspirin 81 mg daily prior to admission, now on aspirin 81 mg daily and clopidogrel 75 mg daily following load. Continue DAPT x 3 weeks then plavix alone.   Therapy recommendations:  pending   Disposition:  pending   Hyperlipidemia  Home meds:  lipitor 40  Now on lipitor 80  LDL 49, goal < 70  Continue statin at discharge  Other Stroke Risk Factors  Former Cigarette smoker  Hx stroke/TIA  2018 - eval and [place] in [place]  Family hx stroke (father)  Migraines on topamax  Other Active Problems  B 12 deficiency  Post herpetic neuralgia  Hospital day # 0  I have personally obtained history,examined this patient, reviewed notes, independently viewed imaging studies, participated in medical decision making and plan of care.ROS completed by me personally and pertinent positives fully documented  I have made any additions or clarifications directly to the above note.  He presented with transient right-sided weakness likely due to small left subcortical lacunar infarct not visualized on CT.  MRI cannot be obtained due to him having a spinal cord stimulator.  Patient has recovered well.  Recommend aspirin and Plavix for 3 weeks followed by Plavix alone.  Aggressive risk factor modification.  Patient be discharged home after echocardiogram.  Follow-up as an outpatient stroke clinic in 6 weeks.  Discussed with Dr. Jerral RalphGhimire.  Greater than 50% time during this 25-minute visit was spent on counseling and coordination of care about his lacunar stroke and discussion about evaluation and treatment plan  and answering questions.  Stroke team will sign off.  Kindly call for questions  Delia HeadyPramod Wayman Hoard, MD Medical Director [facility] Pager: [phone] 05/18/2019 2:52 PM   To contact Stroke Continuity provider, please refer to [email or web address]. After hours, contact General Neurology

## 2019-05-18 NOTE — Evaluation (Addendum)
Physical Therapy Evaluation Patient Details Name: Ryan Nicholson A Hitz MRN: [id number] DOB: [birth year] Today's Date: 05/18/2019   History of Present Illness  Patient is a 58 year old male past medical history significant for migraines, hyperlipidemia, DJD, dizziness and B12 deficiency admitted due to R sided weakness.  Unable to perform MRI due to spinal cord stimulator.  Per neurology presumed small vessle infarct.  Clinical Impression  Patient presents with mobility back to baseline except slight R LE weakness.  Able to complete DGI with score 22/24 with low fall risk currently.  Feel stable for home when medically ready without follow up PT needs.  Will sign off as pretty much back to baseline.  Did complete education on gradual return to activity and stroke warning signs.     Follow Up Recommendations No PT follow up    Equipment Recommendations  None recommended by PT    Recommendations for Other Services       Precautions / Restrictions Precautions Precautions: Fall      Mobility  Bed Mobility Overal bed mobility: Modified Independent                Transfers Overall transfer level: Modified independent Equipment used: None                Ambulation/Gait Ambulation/Gait assistance: Independent Gait Distance (Feet): 170 Feet Assistive device: None Gait Pattern/deviations: Step-through pattern;Wide base of support     General Gait Details: mildly widened BOS, no LOB completed DGI  Stairs Stairs: Yes Stairs assistance: Modified independent (Device/Increase time) Stair Management: One rail Left;Forwards;Step to pattern Number of Stairs: 2(x 3) General stair comments: able to turn on steps and repeat without c/o dizziness  Wheelchair Mobility    Modified Rankin (Stroke Patients Only) Modified Rankin (Stroke Patients Only) Pre-Morbid Rankin Score: No significant disability Modified Rankin: Moderate disability     Balance                                 Standardized Balance Assessment Standardized Balance Assessment : Dynamic Gait Index   Dynamic Gait Index Level Surface: Normal Change in Gait Speed: Normal Gait with Horizontal Head Turns: Normal Gait with Vertical Head Turns: Normal Gait and Pivot Turn: Normal Step Over Obstacle: Mild Impairment Step Around Obstacles: Normal Steps: Mild Impairment Total Score: 22       Pertinent Vitals/Pain Pain Assessment: Faces Faces Pain Scale: Hurts little more Pain Location: chronic L sided pain from nerve damage Pain Descriptors / Indicators: Constant Pain Intervention(s): Monitored during session;Repositioned    Home Living Family/patient expects to be discharged to:: Private residence Living Arrangements: Spouse/significant other Available Help at Discharge: Family Type of Home: House Home Access: Level entry     Home Layout: Two level;Bed/bath upstairs Home Equipment: Cane - single point      Prior Function Level of Independence: Independent               Hand Dominance   Dominant Hand: Right    Extremity/Trunk Assessment   Upper Extremity Assessment Upper Extremity Assessment: Overall WFL for tasks assessed    Lower Extremity Assessment Lower Extremity Assessment: RLE deficits/detail RLE Deficits / Details: AROM WFL, strength grossly 3+/5 hip flexion, otherwise WFL, prolonged h/o decreased sensation in feet    Cervical / Trunk Assessment Cervical / Trunk Assessment: Kyphotic  Communication   Communication: No difficulties  Cognition Arousal/Alertness: Awake/alert Behavior During Therapy: WFL for tasks assessed/performed Overall Cognitive  Status: Within Functional Limits for tasks assessed                                        General Comments      Exercises     Assessment/Plan    PT Assessment Patent does not need any further PT services  PT Problem List         PT Treatment Interventions      PT Goals  (Current goals can be found in the Care Plan section)  Acute Rehab PT Goals PT Goal Formulation: All assessment and education complete, DC therapy    Frequency     Barriers to discharge        Co-evaluation               AM-PAC PT "6 Clicks" Mobility  Outcome Measure Help needed turning from your back to your side while in a flat bed without using bedrails?: None Help needed moving from lying on your back to sitting on the side of a flat bed without using bedrails?: None Help needed moving to and from a bed to a chair (including a wheelchair)?: None Help needed standing up from a chair using your arms (e.g., wheelchair or bedside chair)?: None Help needed to walk in hospital room?: None Help needed climbing 3-5 steps with a railing? : None 6 Click Score: 24    End of Session Equipment Utilized During Treatment: Gait belt Activity Tolerance: Patient tolerated treatment well Patient left: in bed;with call bell/phone within reach;with bed alarm set   PT Visit Diagnosis: Other abnormalities of gait and mobility (R26.89)    Time: 8768-1157 PT Time Calculation (min) (ACUTE ONLY): 25 min   Charges:   PT Evaluation $PT Eval Low Complexity: 1 Low PT Treatments $Self Care/Home Management: [place], [place] [phone] 05/18/2019   Reginia Naas 05/18/2019, 1:46 PM

## 2019-07-12 ENCOUNTER — Inpatient Hospital Stay: Payer: Medicare Other | Admitting: Adult Health

## 2019-07-18 ENCOUNTER — Telehealth: Payer: Self-pay | Admitting: Neurology

## 2019-07-18 ENCOUNTER — Other Ambulatory Visit: Payer: Self-pay

## 2019-07-18 ENCOUNTER — Inpatient Hospital Stay: Payer: Medicare Other | Admitting: Neurology

## 2019-07-18 ENCOUNTER — Encounter: Payer: Self-pay | Admitting: Neurology

## 2019-07-18 NOTE — Telephone Encounter (Signed)
Patient was late to 10/21 appt and needed to be rescheduled. I called patient and LVM requesting patient call back.

## 2019-08-16 ENCOUNTER — Ambulatory Visit: Payer: Medicare Other | Admitting: Neurology

## 2019-08-16 ENCOUNTER — Encounter: Payer: Self-pay | Admitting: Neurology

## 2019-08-16 ENCOUNTER — Other Ambulatory Visit: Payer: Self-pay

## 2019-08-16 VITALS — BP 101/58 | HR 67 | Temp 97.8°F | Ht 67.0 in | Wt 157.2 lb

## 2019-08-16 DIAGNOSIS — R42 Dizziness and giddiness: Secondary | ICD-10-CM

## 2019-08-16 NOTE — Patient Instructions (Signed)
I had a long discussion with the patient regarding his recent episodes of right-sided transient weakness was possibly a small left subcortical infarct not visualized on CT scan and we cannot do MRI due to spinal cord stimulator.  He also has new symptoms of transient dizziness and imbalance when he looks down which may be related to degenerative's upper cervical spine disease.  Have advised him to avoid sudden neck movements and to get up slowly.  He will remain on Plavix for stroke prevention and maintain aggressive risk factor modification with strict control of hypertension with blood pressure goal below 130/90, lipids with LDL cholesterol goal below 70 mg percent and diabetes with hemoglobin A1c goal below 6.5%.  He was also encouraged to eat healthy diet and to be active and exercise regularly.  He was advised to call me after his spinal cord stimulator was removed to order MRI scan of his brain and cervical spine to evaluate his dizziness further.  He will return for follow-up in the future in 6 months with my nurse practitioner Shanda Bumps or call earlier if necessary.  Stroke Prevention Some medical conditions and behaviors are associated with a higher chance of having a stroke. You can help prevent a stroke by making nutrition, lifestyle, and other changes, including managing any medical conditions you may have. What nutrition changes can be made?   Eat healthy foods. You can do this by: ? Choosing foods high in fiber, such as fresh fruits and vegetables and whole grains. ? Eating at least 5 or more servings of fruits and vegetables a day. Try to fill half of your plate at each meal with fruits and vegetables. ? Choosing lean protein foods, such as lean cuts of meat, poultry without skin, fish, tofu, beans, and nuts. ? Eating low-fat dairy products. ? Avoiding foods that are high in salt (sodium). This can help lower blood pressure. ? Avoiding foods that have saturated fat, trans fat, and  cholesterol. This can help prevent high cholesterol. ? Avoiding processed and premade foods.  Follow your health care provider's specific guidelines for losing weight, controlling high blood pressure (hypertension), lowering high cholesterol, and managing diabetes. These may include: ? Reducing your daily calorie intake. ? Limiting your daily sodium intake to 1,500 milligrams (mg). ? Using only healthy fats for cooking, such as olive oil, canola oil, or sunflower oil. ? Counting your daily carbohydrate intake. What lifestyle changes can be made?  Maintain a healthy weight. Talk to your health care provider about your ideal weight.  Get at least 30 minutes of moderate physical activity at least 5 days a week. Moderate activity includes brisk walking, biking, and swimming.  Do not use any products that contain nicotine or tobacco, such as cigarettes and e-cigarettes. If you need help quitting, ask your health care provider. It may also be helpful to avoid exposure to secondhand smoke.  Limit alcohol intake to no more than 1 drink a day for nonpregnant women and 2 drinks a day for men. One drink equals 12 oz of beer, 5 oz of wine, or 1 oz of hard liquor.  Stop any illegal drug use.  Avoid taking birth control pills. Talk to your health care provider about the risks of taking birth control pills if: ? You are over 85 years old. ? You smoke. ? You get migraines. ? You have ever had a blood clot. What other changes can be made?  Manage your cholesterol levels. ? Eating a healthy diet is important for  preventing high cholesterol. If cholesterol cannot be managed through diet alone, you may also need to take medicines. ? Take any prescribed medicines to control your cholesterol as told by your health care provider.  Manage your diabetes. ? Eating a healthy diet and exercising regularly are important parts of managing your blood sugar. If your blood sugar cannot be managed through diet and  exercise, you may need to take medicines. ? Take any prescribed medicines to control your diabetes as told by your health care provider.  Control your hypertension. ? To reduce your risk of stroke, try to keep your blood pressure below 130/80. ? Eating a healthy diet and exercising regularly are an important part of controlling your blood pressure. If your blood pressure cannot be managed through diet and exercise, you may need to take medicines. ? Take any prescribed medicines to control hypertension as told by your health care provider. ? Ask your health care provider if you should monitor your blood pressure at home. ? Have your blood pressure checked every year, even if your blood pressure is normal. Blood pressure increases with age and some medical conditions.  Get evaluated for sleep disorders (sleep apnea). Talk to your health care provider about getting a sleep evaluation if you snore a lot or have excessive sleepiness.  Take over-the-counter and prescription medicines only as told by your health care provider. Aspirin or blood thinners (antiplatelets or anticoagulants) may be recommended to reduce your risk of forming blood clots that can lead to stroke.  Make sure that any other medical conditions you have, such as atrial fibrillation or atherosclerosis, are managed. What are the warning signs of a stroke? The warning signs of a stroke can be easily remembered as BEFAST.  B is for balance. Signs include: ? Dizziness. ? Loss of balance or coordination. ? Sudden trouble walking.  E is for eyes. Signs include: ? A sudden change in vision. ? Trouble seeing.  F is for face. Signs include: ? Sudden weakness or numbness of the face. ? The face or eyelid drooping to one side.  A is for arms. Signs include: ? Sudden weakness or numbness of the arm, usually on one side of the body.  S is for speech. Signs include: ? Trouble speaking (aphasia). ? Trouble understanding.  T is for  time. ? These symptoms may represent a serious problem that is an emergency. Do not wait to see if the symptoms will go away. Get medical help right away. Call your local emergency services (911 in the [place] Do not drive yourself to the hospital.  Other signs of stroke may include: ? A sudden, severe headache with no known cause. ? Nausea or vomiting. ? Seizure. Where to find more information For more information, visit:  American Stroke Association: www.strokeassociation.org  National Stroke Association: www.stroke.org Summary  You can prevent a stroke by eating healthy, exercising, not smoking, limiting alcohol intake, and managing any medical conditions you may have.  Do not use any products that contain nicotine or tobacco, such as cigarettes and e-cigarettes. If you need help quitting, ask your health care provider. It may also be helpful to avoid exposure to secondhand smoke.  Remember BEFAST for warning signs of stroke. Get help right away if you or a loved one has any of these signs. This information is not intended to replace advice given to you by your health care provider. Make sure you discuss any questions you have with your health care provider. Document Released: 10/21/2004  Document Revised: 08/26/2017 Document Reviewed: 10/19/2016 Elsevier Patient Education  2020 [employer].

## 2019-08-16 NOTE — Progress Notes (Signed)
[facility] [address] [place] [id number] [phone]       OFFICE FOLLOW-UP NOTE  Mr. Ryan Nicholson Date of Birth:  06/15/1961 Medical Record Number:  [id number]   HPI: Mr. Ryan Nicholson is a 58 year old Caucasian male seen today for initial office follow-up visit following hospital admission for stroke in August 2020.  History is obtained from the patient, review of electronic medical records and I personally reviewed imaging films in PACS.  He presented on 05/17/2019 with sudden onset of right-sided weakness which was first noted upon awakening from sleep in the morning.  He noticed some numbness in the right side of the face as well and felt he was off balance.  His symptoms gradually improved over the course of the day.  He presented beyond time window for TPA.  CT scan of the head x2 showed no acute abnormality.  CT angiogram of the head and neck showed no large vessel stenosis only mild atherosclerotic changes.  MRI could not be done due to spinal cord stimulator.  2D echo showed normal ejection fraction without cardiac source of embolism.  LDL cholesterol was 46 mg percent hemoglobin A1c was 5.8.  Patient had a previous history of TIA in 2018 for which she was evaluated and treated in [place].  He did have family history of stroke in his father.  He has history of remote smoking and quit years ago.  He had past neurological history of migraines in the past which had responded to Topamax and postherpetic neuralgia.as well as chronic history of chronic back pain related to workplace injury in the year 2000.  Patient had thoracic disc herniation at that time related to lifting heavy boxes.  Patient had surgery, spinal cord stimulator, and has been under chronic pain management since that time. He states is done well since discharge has had no further right-sided weakness and numbness.  He is tolerating Plavix well without bleeding or bruising.  Blood pressure is under  good control.  Is tolerating Lipitor well without muscle aches and pains.  He had recent lipid profile checked by primary physician and it was satisfactory.  He complains of dizziness lightheadedness and unsteadiness when he bends down and looks at his feet.  He denies true vertigo room spinning sensation when turning in bed.  He does occasionally get nauseous.  He states his spinal cord pain stimulator is not working and he plans to have it removed soon. ROS:   14 system review of systems is positive for dizziness, lightheadedness, unsteadiness, pain, numbness and all other systems negative  PMH:  Past Medical History:  Diagnosis Date   B12 deficiency    Dizziness    DJD (degenerative joint disease)    Gastric ulcer    hx of   GERD (gastroesophageal reflux disease)    Hypercholesteremia    Migraine    Post herpetic neuralgia    Stroke [facility](HCC)     Social History:  Social History   Socioeconomic History   Marital status: Married    Spouse name: Ryan Nicholson   Number of children: Not on file   Years of education: Not on file   Highest education level: Not on file  Occupational History   Not on file  Social Needs   Financial resource strain: Not on file   Food insecurity    Worry: Not on file    Inability: Not on file   Transportation needs    Medical: Not on file  Non-medical: Not on file  Tobacco Use   Smoking status: Former Smoker    Quit date: 02/14/2001    Years since quitting: 18.5   Smokeless tobacco: Never Used  Substance and Sexual Activity   Alcohol use: No   Drug use: No   Sexual activity: Not on file  Lifestyle   Physical activity    Days per week: Not on file    Minutes per session: Not on file   Stress: Not on file  Relationships   Social connections    Talks on phone: Not on file    Gets together: Not on file    Attends religious service: Not on file    Active member of club or organization: Not on file    Attends meetings of clubs  or organizations: Not on file    Relationship status: Not on file   Intimate partner violence    Fear of current or ex partner: Not on file    Emotionally abused: Not on file    Physically abused: Not on file    Forced sexual activity: Not on file  Other Topics Concern   Not on file  Social History Narrative   Lives with wife    Medications:   Current Outpatient Medications on File Prior to Visit  Medication Sig Dispense Refill   albuterol (PROVENTIL HFA;VENTOLIN HFA) 108 (90 Base) MCG/ACT inhaler Inhale 2 puffs into the lungs every 4 (four) hours as needed for wheezing or shortness of breath.     atorvastatin (LIPITOR) 80 MG tablet Take 0.5 tablets (40 mg total) by mouth at bedtime. 15 tablet 0   baclofen (LIORESAL) 10 MG tablet Take 10 mg by mouth daily as needed for muscle spasms.      clopidogrel (PLAVIX) 75 MG tablet Take 75 mg by mouth daily.     cyanocobalamin (,VITAMIN B-12,) 1000 MCG/ML injection Inject 1 mL (1,000 mcg total) into the muscle every 30 (thirty) days. 3 mL 1   fluticasone (FLONASE) 50 MCG/ACT nasal spray Place 2 sprays into both nostrils daily. 16 g 11   gabapentin (NEURONTIN) 300 MG capsule Take 300 mg by mouth 2 (two) times daily as needed.     montelukast (SINGULAIR) 10 MG tablet TK 1 T PO D     ondansetron (ZOFRAN) 8 MG tablet Take 1 tablet (8 mg total) by mouth every 8 (eight) hours as needed for nausea or vomiting. 20 tablet 1   Prenat-FeCbn-FeBisg-FA-Omega (MULTIVITAMIN/MINERALS PO) Take 1 tablet by mouth daily.     sertraline (ZOLOFT) 100 MG tablet Take 100 mg by mouth daily.     SUMAtriptan (IMITREX) 100 MG tablet Take 100 mg by mouth every 2 (two) hours as needed for migraine. May repeat in 2 hours if headache persists or recurs.     tapentadol HCl (NUCYNTA) 75 MG tablet Take 75 mg by mouth 4 (four) times daily.     topiramate (TOPAMAX) 100 MG tablet Take 100 mg by mouth at bedtime.     traZODone (DESYREL) 100 MG tablet TK 1 T PO HS      No current facility-administered medications on file prior to visit.     Allergies:   Allergies  Allergen Reactions   Oxycontin [Oxycodone] Other (See Comments)    Unknown   Reglan [Metoclopramide] Nausea Only    shakey    Physical Exam General:  Frail middle-aged Caucasian male seated, in no evident distress Head: head normocephalic and atraumatic.  Neck: supple with no carotid or  supraclavicular bruits Cardiovascular: regular rate and rhythm, no murmurs Musculoskeletal: Severe kyphoscoliosis.  Mild restriction of neck movements due to muscle tightness. Skin:  no rash/petichiae Vascular:  Normal pulses all extremities Vitals:   08/16/19 1438  BP: (!) 101/58  Pulse: 67  Temp: 97.8 F (36.6 C)   Neurologic Exam Mental Status: Awake and fully alert. Oriented to place and time. Recent and remote memory intact. Attention span, concentration and fund of knowledge appropriate. Mood and affect appropriate.  Cranial Nerves: Fundoscopic exam reveals sharp disc margins. Pupils equal, briskly reactive to light. Extraocular movements full without nystagmus. Visual fields full to confrontation. Hearing intact. Facial sensation intact. Face, tongue, palate moves normally and symmetrically.  Motor: Normal bulk and tone. Normal strength in all tested extremity muscles. Sensory.: intact to touch ,pinprick .position and vibratory sensation.  Coordination: Rapid alternating movements normal in all extremities. Finger-to-nose and heel-to-shin performed accurately bilaterally. Gait and Station: Arises from chair without difficulty. Stance is normal. Gait demonstrates normal stride length and balance . Able to heel, toe and tandem walk with mild difficulty.  Reflexes: 1+ and symmetric. Toes downgoing.   NIHSS  0 Modified Rankin  1   ASSESSMENT: 58 year old Caucasian male with transient right sided weakness possibly left hemispheric TIA versus small infarct from small vessel disease not  visualized on CT scan x2.  Patient unable to do MRI due to spinal cord stimulator.  Vascular risk factors of hyperlipidemia only.  New complaints of positional dizziness of unclear etiology.  PLAN: I had a long discussion with the patient regarding his recent episodes of right-sided transient weakness was possibly a small left subcortical infarct not visualized on CT scan and we cannot do MRI due to spinal cord stimulator.  He also has new symptoms of transient dizziness and imbalance when he looks down which may be related to degenerative's upper cervical spine disease.  Have advised him to avoid sudden neck movements and to get up slowly.  He will remain on Plavix for stroke prevention and maintain aggressive risk factor modification with strict control of hypertension with blood pressure goal below 130/90, lipids with LDL cholesterol goal below 70 mg percent and diabetes with hemoglobin A1c goal below 6.5%.  He was also encouraged to eat healthy diet and to be active and exercise regularly.  He was advised to call me after his spinal cord stimulator was removed to order MRI scan of his brain and cervical spine to evaluate his dizziness further.  He will return for follow-up in the future in 6 months with my nurse practitioner Ryan Nicholson or call earlier if necessary. Greater than 50% of time during this 25 minute visit was spent on counseling,explanation of diagnosis, planning of further management, discussion with patient and family and coordination of care Ryan Contras, MD  [facility] [address] [place], [place] [address]  Phone [phone] Fax [phone] Note: This document was prepared with digital dictation and possible smart phrase technology. Any transcriptional errors that result from this process are unintentional

## 2019-09-11 ENCOUNTER — Ambulatory Visit: Payer: Medicare Other | Admitting: Neurology

## 2019-11-07 ENCOUNTER — Telehealth: Payer: Self-pay | Admitting: Neurology

## 2019-11-07 NOTE — Telephone Encounter (Signed)
Ok for earlier appt in March. If he keeps falling go to Er. Make sure he stays well hydrated and gets up and turns slowly

## 2019-11-07 NOTE — Telephone Encounter (Signed)
Sattler,Ryan Nicholson(wife on DPR) has called asking for a call from RN to discuss pt weakness, lightheaded, dizzy, almost falling in shower.  Wife accepted 1st available appointment with Dr. Pearlean Brownie and pt is on wait list.  Please call

## 2019-11-07 NOTE — Telephone Encounter (Signed)
Left vm for patients wife [name] to call back about her concerns.

## 2019-11-08 NOTE — Telephone Encounter (Signed)
LEft vm for patients wife to call back about her husband dizzy spells, and weakness.

## 2019-11-13 NOTE — Telephone Encounter (Signed)
I called pts wife about her husbands dizzy spells. Pt stated Dr. Yetta Barre stated he spoke to Dr. Pearlean Brownie about taking out the spinal cord pain stimulator to have the MRi to check out why he is so dizzy. She stated Dr.Jones stated he needs approval from Dr.SEthi. I gave the wife the follow up appt for March that pt has. I advise wife that Dr Pearlean Brownie recommend pt use his walker and gets up slowly when he is ready to ambulate. He needs to stay hydrated and take his blood pressure when the dizzy spell happens. They want Dr. Pearlean Brownie to speak with Dr. Marikay Alar at [phone] about getting a MRI and the spinal cord stimulator.I stated message will be sent to Dr. Pearlean Brownie. The wife verbalized understanding.

## 2019-11-13 NOTE — Telephone Encounter (Signed)
Ellender,Mary(wife on DPR) has returned call to [facility]  please call

## 2019-11-13 NOTE — Telephone Encounter (Signed)
Wife Damichael Hofman returned call. She states you can call [phone] if the other number does not work for you.

## 2019-11-13 NOTE — Telephone Encounter (Signed)
LEft vm for patients wife and return her phone call.

## 2019-11-15 NOTE — Telephone Encounter (Signed)
I spoke to Dr. Yetta Barre today and reviewed the patient's chart.  There is certain risk involved in removing spinal cord stimulator and it is probably in patients interest not to remove it.  If his neck needs to be imaged for degenerative cervical spine disease Dr. Yetta Barre can order CT neck or CT myelo neck if necessary

## 2019-11-22 NOTE — Telephone Encounter (Signed)
I gave pt Dr.SEthi recommendations on the spinal cord stimulator. I stated Dr.SEthi spoke to Dr. Yetta Barre and its in best interest not to remove the spinal cord stimulator. I stated her husband has an app on Monday march 8 to discuss her concerns of dizzy spells and wanting a MRI brain. The wife verbalized understanding.

## 2019-12-03 ENCOUNTER — Other Ambulatory Visit: Payer: Self-pay

## 2019-12-03 ENCOUNTER — Encounter: Payer: Self-pay | Admitting: Neurology

## 2019-12-03 ENCOUNTER — Ambulatory Visit: Payer: Medicare Other | Admitting: Neurology

## 2019-12-03 VITALS — BP 99/64 | HR 60 | Temp 97.2°F | Ht 67.0 in | Wt 161.0 lb

## 2019-12-03 DIAGNOSIS — G3281 Cerebellar ataxia in diseases classified elsewhere: Secondary | ICD-10-CM

## 2019-12-03 DIAGNOSIS — R260 Ataxic gait: Secondary | ICD-10-CM | POA: Diagnosis not present

## 2019-12-03 DIAGNOSIS — R42 Dizziness and giddiness: Secondary | ICD-10-CM

## 2019-12-03 NOTE — Progress Notes (Signed)
[facility] [address] [place]. [place] [address] [phone]       OFFICE FOLLOW-UP NOTE  Mr. Ryan Nicholson Date of Birth:  September 07, 1961 Medical Record Number:  [id number]   HPI: Ryan Nicholson is a 59 year old Caucasian male seen today for initial office follow-up visit following hospital admission for stroke in August 2020.  History is obtained from the patient, review of electronic medical records and I personally reviewed imaging films in PACS.  He presented on 05/17/2019 with sudden onset of right-sided weakness which was first noted upon awakening from sleep in the morning.  He noticed some numbness in the right side of the face as well and felt he was off balance.  His symptoms gradually improved over the course of the day.  He presented beyond time window for TPA.  CT scan of the head x2 showed no acute abnormality.  CT angiogram of the head and neck showed no large vessel stenosis only mild atherosclerotic changes.  MRI could not be done due to spinal cord stimulator.  2D echo showed normal ejection fraction without cardiac source of embolism.  LDL cholesterol was 46 mg percent hemoglobin A1c was 5.8.  Patient had a previous history of TIA in 2018 for which she was evaluated and treated in [place].  He did have family history of stroke in his father.  He has history of remote smoking and quit years ago.  He had past neurological history of migraines in the past which had responded to Topamax and postherpetic neuralgia.as well as chronic history of chronic back pain related to workplace injury in the year 2000.  Patient had thoracic disc herniation at that time related to lifting heavy boxes.  Patient had surgery, spinal cord stimulator, and has been under chronic pain management since that time. He states is done well since discharge has had no further right-sided weakness and numbness.  He is tolerating Plavix well without bleeding or bruising.  Blood pressure is under  good control.  Is tolerating Lipitor well without muscle aches and pains.  He had recent lipid profile checked by primary physician and it was satisfactory.  He complains of dizziness lightheadedness and unsteadiness when he bends down and looks at his feet.  He denies true vertigo room spinning sensation when turning in bed.  He does occasionally get nauseous.  He states his spinal cord pain stimulator is not working and he plans to have it removed soon. Update 12/03/2019 : He returns for follow-up after last visit 3 months ago.  Is accompanied by his wife.  Patient states that he is still dizzy off balance and stumbles quite a lot.  He often bumps into objects.  This is not necessarily positional anymore and can happen at any times.  He is able to hold onto objects and avoid major falls or injuries.  The patient wants his cervical spinal cord stimulator removed as he feels it is really not helping him and it will hamper his ability to get the MRI in the future.  I did speak to Dr. Marikay Alar about this who felt that the patient's risk of complications with the removal procedure did not merit this and I shared this with the patient and his wife but they still want it removed.  He does complain of some numbness in his toes but denies sensory loss.  He states he had EMG nerve conduction study done a few years ago should confirm some nerve damage from his previous spine  surgeries. ROS:   14 system review of systems is positive for dizziness, lightheadedness, unsteadiness, falls, numbness in the feet, tingling pain, numbness and all other systems negative  PMH:  Past Medical History:  Diagnosis Date  . B12 deficiency   . Dizziness   . DJD (degenerative joint disease)   . Gastric ulcer    hx of  . GERD (gastroesophageal reflux disease)   . Hypercholesteremia   . Migraine   . Post herpetic neuralgia   . Stroke [facility])     Social History:  Social History   Socioeconomic History  . Marital status:  Married    Spouse name: Ryan Nicholson  . Number of children: Not on file  . Years of education: Not on file  . Highest education level: Not on file  Occupational History  . Not on file  Tobacco Use  . Smoking status: Former Smoker    Quit date: 02/14/2001    Years since quitting: 18.8  . Smokeless tobacco: Never Used  Substance and Sexual Activity  . Alcohol use: No  . Drug use: No  . Sexual activity: Not on file  Other Topics Concern  . Not on file  Social History Narrative   Lives with wife   Social Determinants of Health   Financial Resource Strain:   . Difficulty of Paying Living Expenses: Not on file  Food Insecurity:   . Worried About Programme researcher, broadcasting/film/video in the Last Year: Not on file  . Ran Out of Food in the Last Year: Not on file  Transportation Needs:   . Lack of Transportation (Medical): Not on file  . Lack of Transportation (Non-Medical): Not on file  Physical Activity:   . Days of Exercise per Week: Not on file  . Minutes of Exercise per Session: Not on file  Stress:   . Feeling of Stress : Not on file  Social Connections:   . Frequency of Communication with Friends and Family: Not on file  . Frequency of Social Gatherings with Friends and Family: Not on file  . Attends Religious Services: Not on file  . Active Member of Clubs or Organizations: Not on file  . Attends Banker Meetings: Not on file  . Marital Status: Not on file  Intimate Partner Violence:   . Fear of Current or Ex-Partner: Not on file  . Emotionally Abused: Not on file  . Physically Abused: Not on file  . Sexually Abused: Not on file    Medications:   Current Outpatient Medications on File Prior to Visit  Medication Sig Dispense Refill  . albuterol (PROVENTIL HFA;VENTOLIN HFA) 108 (90 Base) MCG/ACT inhaler Inhale 2 puffs into the lungs every 4 (four) hours as needed for wheezing or shortness of breath.    Marland Kitchen atorvastatin (LIPITOR) 80 MG tablet Take by mouth.    Marland Kitchen azelastine  (ASTELIN) 0.1 % nasal spray azelastine 137 mcg (0.1 %) nasal spray aerosol    . baclofen (LIORESAL) 10 MG tablet Take 10 mg by mouth daily as needed for muscle spasms.     . clopidogrel (PLAVIX) 75 MG tablet Take 75 mg by mouth daily.    . cyanocobalamin (,VITAMIN B-12,) 1000 MCG/ML injection Inject 1 mL (1,000 mcg total) into the muscle every 30 (thirty) days. 3 mL 1  . fluticasone (FLONASE) 50 MCG/ACT nasal spray Place 2 sprays into both nostrils daily. 16 g 11  . mometasone (NASONEX) 50 MCG/ACT nasal spray Nasonex 50 mcg/actuation Spray    .  montelukast (SINGULAIR) 10 MG tablet TK 1 T PO D    . ondansetron (ZOFRAN) 8 MG tablet Take 1 tablet (8 mg total) by mouth every 8 (eight) hours as needed for nausea or vomiting. 20 tablet 1  . sertraline (ZOLOFT) 100 MG tablet Take 100 mg by mouth daily.    . SUMAtriptan (IMITREX) 100 MG tablet Take 100 mg by mouth every 2 (two) hours as needed for migraine. May repeat in 2 hours if headache persists or recurs.    . tapentadol HCl (NUCYNTA) 75 MG tablet Take 75 mg by mouth 4 (four) times daily.    Marland Kitchen topiramate (TOPAMAX) 100 MG tablet Take 100 mg by mouth at bedtime.    . traZODone (DESYREL) 100 MG tablet TK 1 T PO HS     No current facility-administered medications on file prior to visit.    Allergies:   Allergies  Allergen Reactions  . Oxycontin [Oxycodone] Other (See Comments)    Unknown  . Reglan [Metoclopramide] Nausea Only    shakey    Physical Exam General:  Frail middle-aged Caucasian male seated, in no evident distress Head: head normocephalic and atraumatic.  Neck: supple with no carotid or supraclavicular bruits Cardiovascular: regular rate and rhythm, no murmurs Musculoskeletal: Severe kyphoscoliosis.  Mild restriction of neck movements due to muscle tightness. Skin:  no rash/petichiae Vascular:  Normal pulses all extremities Vitals:   12/03/19 1408  BP: 99/64  Pulse: 60  Temp: (!) 97.2 F (36.2 C)   Neurologic Exam Mental  Status: Awake and fully alert. Oriented to place and time. Recent and remote memory intact. Attention span, concentration and fund of knowledge appropriate. Mood and affect appropriate.  Cranial Nerves: Fundoscopic exam reveals sharp disc margins. Pupils equal, briskly reactive to light. Extraocular movements full without nystagmus. Visual fields full to confrontation. Hearing intact. Facial sensation intact. Face, tongue, palate moves normally and symmetrically.  Motor: Normal bulk and tone. Normal strength in all tested extremity muscles. Sensory.: intact to touch ,pinprick .position  sensation.  Slight diminished vibration over toes bilaterally. Coordination: Rapid alternating movements normal in all extremities. Finger-to-nose and heel-to-shin performed accurately bilaterally. Gait and Station: Arises from chair without difficulty. Stance is normal. Gait demonstrates normal stride length and balance . Able to heel, toe and tandem walk with mild difficulty.  Reflexes: 2+ and symmetric in upper extremities and depressed in both lower extremities.. Toes downgoing.       ASSESSMENT: 59 year old Caucasian male with transient right sided weakness possibly left hemispheric TIA versus small infarct from small vessel disease not visualized on CT scan x2.  Patient unable to do MRI due to spinal cord stimulator.  Vascular risk factors of hyperlipidemia only.  New complaints of   dizziness and gait instability of unclear etiology-possibly cervical spinal stenosis given history of degenerative spine disease.  PLAN: I had a long discussion with the patient and his wife regarding his gait ataxia and dizziness and discussed evaluation and treatment plan and answered questions.  I recommend checking cervical CT scan with and without contrast to look for spinal stenosis and degenerative compressive spine disease.  Patient and wife seem to have made up their mind that they want the spinal cord stimulator to be  removed and they understand fully the risk involved . I recommend they follow-up with Dr. Marikay Alar neurosurgeon for the same.  Patient was advised to get up slowly and avoid quick and sudden movements and we discussed fall precautions.  He will return for  follow-up with me in the future only as necessary and no schedule appointment was made today. Greater than 50% of time during this 30 minute visit was spent on counseling,explanation of diagnosis, planning of further management, discussion with patient and family and coordination of care Antony Contras, MD  [facility] [address] [place], [place] [address]  Phone [phone] Fax [phone] Note: This document was prepared with digital dictation and possible smart phrase technology. Any transcriptional errors that result from this process are unintentional

## 2019-12-03 NOTE — Patient Instructions (Addendum)
I had a long discussion with the patient and his wife regarding his gait ataxia and dizziness and discussed evaluation and treatment plan and answered questions.  I recommend checking cervical CT scan with and without contrast to look for spinal stenosis and degenerative compressive spine disease.  Patient and wife seem to have made up their mind that they want the spinal cord stimulator to be removed and they understand fully the risk involved . I recommend they follow-up with Dr. Marikay Alar neurosurgeon for the same.  Patient was advised to get up slowly and avoid quick and sudden movements and we discussed fall precautions.  He will return for follow-up with me in the future only as necessary and no schedule appointment was made today.  Fall Prevention in the Home, Adult Falls can cause injuries and can affect people from all age groups. There are many simple things that you can do to make your home safe and to help prevent falls. Ask for help when making these changes, if needed. What actions can I take to prevent falls? General instructions  Use good lighting in all rooms. Replace any light bulbs that burn out.  Turn on lights if it is dark. Use night-lights.  Place frequently used items in easy-to-reach places. Lower the shelves around your home if necessary.  Set up furniture so that there are clear paths around it. Avoid moving your furniture around.  Remove throw rugs and other tripping hazards from the floor.  Avoid walking on wet floors.  Fix any uneven floor surfaces.  Add color or contrast paint or tape to grab bars and handrails in your home. Place contrasting color strips on the first and last steps of stairways.  When you use a stepladder, make sure that it is completely opened and that the sides are firmly locked. Have someone hold the ladder while you are using it. Do not climb a closed stepladder.  Be aware of any and all pets. What can I do in the bathroom?       Keep the floor dry. Immediately clean up any water that spills onto the floor.  Remove soap buildup in the tub or shower on a regular basis.  Use non-skid mats or decals on the floor of the tub or shower.  Attach bath mats securely with double-sided, non-slip rug tape.  If you need to sit down while you are in the shower, use a plastic, non-slip stool.  Install grab bars by the toilet and in the tub and shower. Do not use towel bars as grab bars. What can I do in the bedroom?  Make sure that a bedside light is easy to reach.  Do not use oversized bedding that drapes onto the floor.  Have a firm chair that has side arms to use for getting dressed. What can I do in the kitchen?  Clean up any spills right away.  If you need to reach for something above you, use a sturdy step stool that has a grab bar.  Keep electrical cables out of the way.  Do not use floor polish or wax that makes floors slippery. If you must use wax, make sure that it is non-skid floor wax. What can I do in the stairways?  Do not leave any items on the stairs.  Make sure that you have a light switch at the top of the stairs and the bottom of the stairs. Have them installed if you do not have them.  Make sure that  there are handrails on both sides of the stairs. Fix handrails that are broken or loose. Make sure that handrails are as long as the stairways.  Install non-slip stair treads on all stairs in your home.  Avoid having throw rugs at the top or bottom of stairways, or secure the rugs with carpet tape to prevent them from moving.  Choose a carpet design that does not hide the edge of steps on the stairway.  Check any carpeting to make sure that it is firmly attached to the stairs. Fix any carpet that is loose or worn. What can I do on the outside of my home?  Use bright outdoor lighting.  Regularly repair the edges of walkways and driveways and fix any cracks.  Remove high doorway  thresholds.  Trim any shrubbery on the main path into your home.  Regularly check that handrails are securely fastened and in good repair. Both sides of any steps should have handrails.  Install guardrails along the edges of any raised decks or porches.  Clear walkways of debris and clutter, including tools and rocks.  Have leaves, snow, and ice cleared regularly.  Use sand or salt on walkways during winter months.  In the garage, clean up any spills right away, including grease or oil spills. What other actions can I take?  Wear closed-toe shoes that fit well and support your feet. Wear shoes that have rubber soles or low heels.  Use mobility aids as needed, such as canes, walkers, scooters, and crutches.  Review your medicines with your health care provider. Some medicines can cause dizziness or changes in blood pressure, which increase your risk of falling. Talk with your health care provider about other ways that you can decrease your risk of falls. This may include working with a physical therapist or trainer to improve your strength, balance, and endurance. Where to find more information  Centers for Disease Control and Prevention, STEADI: WebmailGuide.co.za  [employer] on Aging: BrainJudge.co.uk Contact a health care provider if:  You are afraid of falling at home.  You feel weak, drowsy, or dizzy at home.  You fall at home. Summary  There are many simple things that you can do to make your home safe and to help prevent falls.  Ways to make your home safe include removing tripping hazards and installing grab bars in the bathroom.  Ask for help when making these changes in your home. This information is not intended to replace advice given to you by your health care provider. Make sure you discuss any questions you have with your health care provider. Document Revised: 08/26/2017 Document Reviewed: 04/28/2017 Elsevier Patient Education  2020  [employer].

## 2019-12-04 ENCOUNTER — Telehealth: Payer: Self-pay | Admitting: Neurology

## 2019-12-04 NOTE — Telephone Encounter (Signed)
[employer] medicare no auth patient is scheduled at GI for 12/14/19.

## 2019-12-11 ENCOUNTER — Other Ambulatory Visit: Payer: Self-pay

## 2019-12-11 ENCOUNTER — Telehealth: Payer: Self-pay | Admitting: Neurology

## 2019-12-11 DIAGNOSIS — G3281 Cerebellar ataxia in diseases classified elsewhere: Secondary | ICD-10-CM

## 2019-12-11 NOTE — Telephone Encounter (Signed)
I called [facility]. They stated if Dr Pearlean Brownie is looking for spinal stenosis or degenerative compression spine disease its best order Ct scan without contrast. I stated order will be change in system.

## 2019-12-11 NOTE — Telephone Encounter (Signed)
[facility] is asking for a call from RN re: pt's upcoming CT scan, she needs clarity on the order.  Please call

## 2019-12-14 ENCOUNTER — Inpatient Hospital Stay: Admission: RE | Admit: 2019-12-14 | Payer: Medicare Other | Source: Ambulatory Visit

## 2019-12-14 ENCOUNTER — Other Ambulatory Visit: Payer: Self-pay | Admitting: Neurological Surgery

## 2019-12-24 NOTE — Pre-Procedure Instructions (Signed)
[id number] [place], [place] - [address] AT [facility] [address] [facility] [address] [place] [address] Phone: [phone] Fax: [phone]      Your procedure is scheduled on Wednesday, April 7th, from 08:30 AM to 09:41 AM .  Report to [facility] Main Entrance "A" at 06:30 A.M., and check in at the Admitting office.  Call this number if you have problems the morning of surgery:  [phone]  Call [phone] if you have any questions prior to your surgery date Monday-Friday 8am-4pm.    Remember:  Do not eat or drink after midnight the night before your surgery.     Take these medicines the morning of surgery with A SIP OF WATER : atorvastatin (LIPITOR) sertraline (ZOLOFT) tapentadol HCl (NUCYNTA)  fluticasone (FLONASE) nasal spray baclofen (LIORESAL)- IF NEEDED ondansetron (ZOFRAN)- IF NEEDED SUMAtriptan (IMITREX)- IF NEEDED  albuterol (PROVENTIL HFA;VENTOLIN HFA) inhaler -IF NEEDED  **Please bring all inhalers with you the day of surgery.**   **Follow your surgeon's instructions on when to stop clopidogrel (PLAVIX).  If no instructions were given by your surgeon then you will need to call the office to get those instructions. **   7 days prior to surgery, stop taking all Aspirin (unless instructed by your doctor) and other Aspirin containing products, Vitamins, Fish Oils, and Herbal Medications. Also stop all NSAIDS i.e. Advil, Ibuprofen, Motrin, Aleve, Anaprox, Naproxen, BC, Goody Powders, and all Supplements.    No Smoking of any kind, Tobacco, or Alcohol products 24 hours prior to your procedure. If you use a CPAP at night, you may bring all equipment for your overnight stay.                        Do not wear jewelry.            Do not wear lotions, powders, colognes, or deodorant.            Men may shave face and neck.            Do not bring valuables to the hospital.            [facility] is not responsible for any belongings  or valuables.   Contacts, glasses, dentures or bridgework may not be worn into surgery.      For patients admitted to the hospital, discharge time will be determined by your treatment team.   Patients discharged the day of surgery will not be allowed to drive home, and someone needs to stay with them for 24 hours.    Special instructions:   [facility]- Preparing For Surgery  Before surgery, you can play an important role. Because skin is not sterile, your skin needs to be as free of germs as possible. You can reduce the number of germs on your skin by washing with CHG (chlorahexidine gluconate) Soap before surgery.  CHG is an antiseptic cleaner which kills germs and bonds with the skin to continue killing germs even after washing.    Oral Hygiene is also important to reduce your risk of infection.  Remember - BRUSH YOUR TEETH THE MORNING OF SURGERY WITH YOUR REGULAR TOOTHPASTE  Please do not use if you have an allergy to CHG or antibacterial soaps. If your skin becomes reddened/irritated stop using the CHG.  Do not shave (including legs and underarms) for at least 48 hours prior to first CHG shower. It is OK to shave  your face.  Please follow these instructions carefully.   1. Shower the NIGHT BEFORE SURGERY and the MORNING OF SURGERY with CHG Soap.   2. If you chose to wash your hair, wash your hair first as usual with your normal shampoo.  3. After you shampoo, rinse your hair and body thoroughly to remove the shampoo.  4. Use CHG as you would any other liquid soap. You can apply CHG directly to the skin and wash gently with a scrungie or a clean washcloth.   5. Apply the CHG Soap to your body ONLY FROM THE NECK DOWN.  Do not use on open wounds or open sores. Avoid contact with your eyes, ears, mouth and genitals (private parts). Wash Face and genitals (private parts)  with your normal soap.   6. Wash thoroughly, paying special attention to the area where your surgery will be  performed.  7. Thoroughly rinse your body with warm water from the neck down.  8. DO NOT shower/wash with your normal soap after using and rinsing off the CHG Soap.  9. Pat yourself dry with a CLEAN TOWEL.  10. Wear CLEAN PAJAMAS to bed the night before surgery, wear comfortable clothes the morning of surgery  11. Place CLEAN SHEETS on your bed the night of your first shower and DO NOT SLEEP WITH PETS.   Day of Surgery:   Do not apply any deodorants/lotions.  Please wear clean clothes to the hospital/surgery center.   Remember to brush your teeth WITH YOUR REGULAR TOOTHPASTE.   Please read over the following fact sheets that you were given.

## 2019-12-25 ENCOUNTER — Other Ambulatory Visit: Payer: Self-pay

## 2019-12-25 ENCOUNTER — Encounter (HOSPITAL_COMMUNITY)
Admission: RE | Admit: 2019-12-25 | Discharge: 2019-12-25 | Disposition: A | Discharge status: Home / Self Care | Payer: Medicare Other | Source: Ambulatory Visit | Attending: Neurological Surgery | Admitting: Neurological Surgery

## 2019-12-25 ENCOUNTER — Encounter (HOSPITAL_COMMUNITY): Payer: Self-pay

## 2019-12-25 DIAGNOSIS — Z7902 Long term (current) use of antithrombotics/antiplatelets: Secondary | ICD-10-CM | POA: Diagnosis not present

## 2019-12-25 DIAGNOSIS — Z01812 Encounter for preprocedural laboratory examination: Secondary | ICD-10-CM | POA: Diagnosis not present

## 2019-12-25 DIAGNOSIS — Z8673 Personal history of transient ischemic attack (TIA), and cerebral infarction without residual deficits: Secondary | ICD-10-CM | POA: Diagnosis not present

## 2019-12-25 DIAGNOSIS — Z79899 Other long term (current) drug therapy: Secondary | ICD-10-CM | POA: Insufficient documentation

## 2019-12-25 DIAGNOSIS — E7800 Pure hypercholesterolemia, unspecified: Secondary | ICD-10-CM | POA: Diagnosis not present

## 2019-12-25 DIAGNOSIS — Y929 Unspecified place or not applicable: Secondary | ICD-10-CM | POA: Insufficient documentation

## 2019-12-25 DIAGNOSIS — Y939 Activity, unspecified: Secondary | ICD-10-CM | POA: Insufficient documentation

## 2019-12-25 DIAGNOSIS — Z87891 Personal history of nicotine dependence: Secondary | ICD-10-CM | POA: Insufficient documentation

## 2019-12-25 DIAGNOSIS — K219 Gastro-esophageal reflux disease without esophagitis: Secondary | ICD-10-CM | POA: Diagnosis not present

## 2019-12-25 DIAGNOSIS — T85113A Breakdown (mechanical) of implanted electronic neurostimulator, generator, initial encounter: Secondary | ICD-10-CM | POA: Diagnosis not present

## 2019-12-25 DIAGNOSIS — F329 Major depressive disorder, single episode, unspecified: Secondary | ICD-10-CM | POA: Diagnosis not present

## 2019-12-25 HISTORY — DX: Personal history of urinary calculi: Z87.442

## 2019-12-25 HISTORY — DX: Depression, unspecified: F32.A

## 2019-12-25 HISTORY — DX: Pneumonia, unspecified organism: J18.9

## 2019-12-25 LAB — BASIC METABOLIC PANEL
Anion gap: 9 (ref 5–15)
BUN: 14 mg/dL (ref 6–20)
CO2: 24 mmol/L (ref 22–32)
Calcium: 9 mg/dL (ref 8.9–10.3)
Chloride: 108 mmol/L (ref 98–111)
Creatinine, Ser: 1.17 mg/dL (ref 0.61–1.24)
GFR calc Af Amer: >60 mL/min (ref >60)
GFR calc non Af Amer: >60 mL/min (ref >60)
Glucose, Bld: 95 mg/dL (ref 70–99)
Potassium: 4.5 mmol/L (ref 3.5–5.1)
Sodium: 141 mmol/L (ref 135–145)

## 2019-12-25 LAB — SURGICAL PCR SCREEN
MRSA, PCR: POSITIVE — AB
Staphylococcus aureus: POSITIVE — AB

## 2019-12-25 LAB — CBC
HCT: 40.4 % (ref 39.0–52.0)
Hemoglobin: 12.9 g/dL — ABNORMAL LOW (ref 13.0–17.0)
MCH: 29.7 pg (ref 26.0–34.0)
MCHC: 31.9 g/dL (ref 30.0–36.0)
MCV: 92.9 fL (ref 80.0–100.0)
Platelets: 180 10*3/uL (ref 150–400)
RBC: 4.35 MIL/uL (ref 4.22–5.81)
RDW: 13.7 % (ref 11.5–15.5)
WBC: 4.8 10*3/uL (ref 4.0–10.5)
nRBC: 0 % (ref 0.0–0.2)

## 2019-12-25 NOTE — Progress Notes (Signed)
PCR positive for MRSA and STAPH. Called and notified Dr. Yetta Barre' office. Called in prescription to patient's pharmacy ([facility] on [address]), called and notified patient about results, and to use Mupirocin Abx tx BID x5 days prior to sx.

## 2019-12-25 NOTE — Progress Notes (Signed)
PCP - Gerrit Friends. Jayme Cloud , M.D. Cardiologist - Denies Neurologist- Delia Heady, MD  PPM/ICD - Denies  Chest x-ray - N/A EKG - 05/18/19 Stress Test - per patient yes, unsure when, where, and by whom. Patient thinks it was done "to check things out", not very sure. ECHO - 05/18/19 Cardiac Cath - Denies  Sleep Study - Yes CPAP - No longer wears, since gastric bypass sx 15 years ago.  Patient denies being a diabetic.  Blood Thinner Instructions: Stop Plavix 7 days prior to sx. Per patient, last dose 12/24/19. Aspirin Instructions: N/A  ERAS Protcol - N/A PRE-SURGERY Ensure or G2- N/A  COVID TEST- 12/29/19   Anesthesia review: Review Echo  Patient denies shortness of breath, fever, cough and chest pain at PAT appointment   All instructions explained to the patient, with a verbal understanding of the material. Patient agrees to go over the instructions while at home for a better understanding. Patient also instructed to self quarantine after being tested for COVID-19. The opportunity to ask questions was provided.

## 2019-12-26 NOTE — Anesthesia Preprocedure Evaluation (Addendum)
Anesthesia Evaluation  Patient identified by MRN, date of birth, ID band Patient awake    Reviewed: Allergy & Precautions, NPO status , Patient's Chart, lab work & pertinent test results  Airway Mallampati: II  TM Distance: >3 FB Neck ROM: Full    Dental no notable dental hx.    Pulmonary neg pulmonary ROS, former smoker,    Pulmonary exam normal breath sounds clear to auscultation       Cardiovascular negative cardio ROS Normal cardiovascular exam Rhythm:Regular Rate:Normal     Neuro/Psych Depression TIACVA negative psych ROS   GI/Hepatic Neg liver ROS, GERD  ,  Endo/Other  negative endocrine ROS  Renal/GU negative Renal ROS  negative genitourinary   Musculoskeletal  (+) Arthritis ,   Abdominal   Peds negative pediatric ROS (+)  Hematology negative hematology ROS (+)   Anesthesia Other Findings   Reproductive/Obstetrics negative OB ROS                             Anesthesia Physical Anesthesia Plan  ASA: II  Anesthesia Plan: General   Post-op Pain Management:    Induction: Intravenous  PONV Risk Score and Plan: 2 and Ondansetron, Midazolam and Treatment may vary due to age or medical condition  Airway Management Planned: Oral ETT  Additional Equipment:   Intra-op Plan:   Post-operative Plan: Extubation in OR  Informed Consent: I have reviewed the patients History and Physical, chart, labs and discussed the procedure including the risks, benefits and alternatives for the proposed anesthesia with the patient or authorized representative who has indicated his/her understanding and acceptance.     Dental advisory given  Plan Discussed with: CRNA  Anesthesia Plan Comments: (See PAT note written by Shonna Chock, PA-C. )       Anesthesia Quick Evaluation

## 2019-12-26 NOTE — Progress Notes (Addendum)
Anesthesia Chart Review:  Case: [id number] Date/Time: 01/02/20 0815   Procedure: Spinal cord stimulator removal  Lumbar (N/A Back) - 3C   Anesthesia type: General   Pre-op diagnosis: Malfunction of spinal cord stimulator   Location: [facility] OR ROOM 19 / [facility]   Surgeons: Eustace Moore, MD      DISCUSSION: Patient is a 59 year old male scheduled for the above procedure.  History includes former smoker (quit 2002), GERD, CVA (04/2019, tPA not considered due to late presentation; TIA ~ 2018 in [facility]), migraines, hypercholesterolemia, post-herpetic neuralgia, gastric bypass (2005), back surgery (2001, 2002, left thoracotomy approach), colon surgery, prostate surgery (Urolift 2017, [place]). On 02/21/01 (scanned under Media tab), he underwent left thoracotomy, subtotal thoracic discectomy T11, T10; interbody fusion T10-T11 with titanium Pyramesh interbody cage, partial left ninth rib resection, posterior segmental spinal instrumentation T10-11.   Last evaluated by neurologist Dr. Leonie Man on 12/03/19. Patient with possible left TIA versus small infarct from small vessel disease in 04/2019. No infarct by CT, but MRI not done due to spinal cord stimulator. More recently he has been having come dizziness and gait instability. Dr. Leonie Man discussed getting cervical CT scan to evaluate for cervical spinal stenosis and degenerative compressive spine disease, but patient desired to get spinal cord stimulator removed first because it was not working and was preventing him from having a MRI. Dr. Leonie Man discussed his recommendations with Dr. Ronnald Ramp and noted that he advised Mr. Chizmar to get up slowly and avoid quick and sudden movement and reviewed fall precautions. It does appear that patient is now scheduled for CT c-spine on 12/28/19 with surgery not scheduled until 01/02/20.  Last Plavix 12/24/19. Presurgical COVID-19 test is scheduled for 12/29/19. Chart to be left for follow-up regarding CT results. (UPDATE 42/21 4:34 PM: Today's CT c-spine  showed at least mild foraminal stenosis, but no high grade canal stenosis.)   VS: BP 113/70   Pulse 62   Temp 36.9 C (Oral)   Resp 17   Ht 5\' 7"  (1.702 m)   Wt 72.4 kg   SpO2 100%   BMI 25.00 kg/m       PROVIDERS: Sherald Hess., MD is PCP  Antony Contras, MD is neurologist   LABS: Labs reviewed: Acceptable for surgery. A1c 5.8 on 05/18/19.  (all labs ordered are listed, but only abnormal results are displayed)  Labs Reviewed  SURGICAL PCR SCREEN - Abnormal; Notable for the following components:      Result Value   MRSA, PCR POSITIVE (*)    Staphylococcus aureus POSITIVE (*)    All other components within normal limits  CBC - Abnormal; Notable for the following components:   Hemoglobin 12.9 (*)    All other components within normal limits  BASIC METABOLIC PANEL     IMAGES: CT C-spine 12/28/19: IMPRESSION: 1. No fracture or static listhesis. 2. Mild cervical spondylosis, most pronounced at C3-4 where there is likely at least mild bilateral foraminal stenosis. No CT evidence of high-grade canal stenosis.  CTA head/neck 05/18/19: IMPRESSION: 1. Negative CTA with no evidence for large vessel occlusion. 2. Mild atherosclerotic change about the carotid bifurcations without significant stenosis. 3. Otherwise negative CTA of the head and neck. No hemodynamically significant or correctable stenosis. No other acute vascular abnormality identified. (Skeletal findings included: No acute osseous finding. No discrete lytic or blastic osseous lesions. Mild degenerative spondylolysis noted at C3-4. Degenerative changes noted about the TMJs bilaterally. Dental carie at the right second maxillary molar.)  CT head 05/17/19 (for CVA evaluation; MRI could not be done due to spinal cord stimulator): IMPRESSION: - Suspect previous cataract removal on the right. Mucosal thickening noted in several ethmoid air cells. - Brain parenchyma appears unremarkable. No evident acute  infarct. No mass or hemorrhage.   EKG: 05/17/19: NSR   CV: Echo 05/18/19: IMPRESSIONS  1. The left ventricle has normal systolic function with an ejection  fraction of 60-65%. The cavity size was normal. Left ventricular diastolic  parameters were normal.  2. The right ventricle has normal systolic function. The cavity was  normal. There is no increase in right ventricular wall thickness.  3. Left atrial size was mildly dilated.  4. No evidence of Right to left shunting across the intraatrial septum by  bubble contrast study.  5. No evidence of mitral valve stenosis.  6. The aorta is normal unless otherwise noted.  7. The aortic root and ascending aorta are normal in size and structure.    Past Medical History:  Diagnosis Date  . B12 deficiency   . Depression   . Dizziness   . DJD (degenerative joint disease)   . Gastric ulcer    hx of  . GERD (gastroesophageal reflux disease)   . History of kidney stones   . Hypercholesteremia   . Migraine   . Pneumonia   . Post herpetic neuralgia   . Stroke [facility])     Past Surgical History:  Procedure Laterality Date  . BACK SURGERY  09/1999, 01/2001  . CHOLECYSTECTOMY  2005  . COLON SURGERY    . EYE SURGERY Right    cataract removal  . GASTRIC BYPASS  03/2004  . HEMORRHOID SURGERY  2010  . HERNIA REPAIR    . KNEE SURGERY  1980, 1985, 2015  . left shoulder surgery  2007  . LUNG SURGERY Left 2002  . PROSTATE SURGERY    . SPINE SURGERY     spinal stimulator  . TONSILECTOMY/ADENOIDECTOMY WITH MYRINGOTOMY    . TONSILLECTOMY      MEDICATIONS: . albuterol (PROVENTIL HFA;VENTOLIN HFA) 108 (90 Base) MCG/ACT inhaler  . atorvastatin (LIPITOR) 40 MG tablet  . baclofen (LIORESAL) 10 MG tablet  . clopidogrel (PLAVIX) 75 MG tablet  . cyanocobalamin (,VITAMIN B-12,) 1000 MCG/ML injection  . fluticasone (FLONASE) 50 MCG/ACT nasal spray  . montelukast (SINGULAIR) 10 MG tablet  . ondansetron (ZOFRAN) 8 MG tablet  . sertraline  (ZOLOFT) 100 MG tablet  . SUMAtriptan (IMITREX) 100 MG tablet  . tapentadol HCl (NUCYNTA) 75 MG tablet  . testosterone cypionate (DEPOTESTOSTERONE CYPIONATE) 200 MG/ML injection  . topiramate (TOPAMAX) 100 MG tablet  . traZODone (DESYREL) 100 MG tablet   No current facility-administered medications for this encounter.    Shonna Chock, PA-C Surgical Short Stay/Anesthesiology [facility] Phone [phone] [facility] Phone [phone] 12/26/2019 2:32 PM

## 2019-12-28 ENCOUNTER — Ambulatory Visit
Admission: RE | Admit: 2019-12-28 | Discharge: 2019-12-28 | Disposition: A | Discharge status: Home / Self Care | Payer: Medicare Other | Source: Ambulatory Visit | Attending: Neurology | Admitting: Neurology

## 2019-12-28 DIAGNOSIS — G3281 Cerebellar ataxia in diseases classified elsewhere: Secondary | ICD-10-CM

## 2019-12-28 IMAGING — CT CT CERVICAL SPINE W/O CM
3 of 4 series · 12 of 33 positions shown, 14 images · non-contrast
Comparison: [DATE]

CLINICAL DATA: Headaches, ataxia

EXAM:
CT CERVICAL SPINE WITHOUT CONTRAST
TECHNIQUE: Multidetector CT imaging of the cervical spine was performed without
intravenous contrast. Multiplanar CT image reconstructions were also
generated.

[Series 3: c-spine 2.00 br60 s3 axial bone · axial · 0.34mm/px · z∈[-816,-696]mm · 4 of 92 slices shown, 5 images]
[im 16/92  soft-tissue]
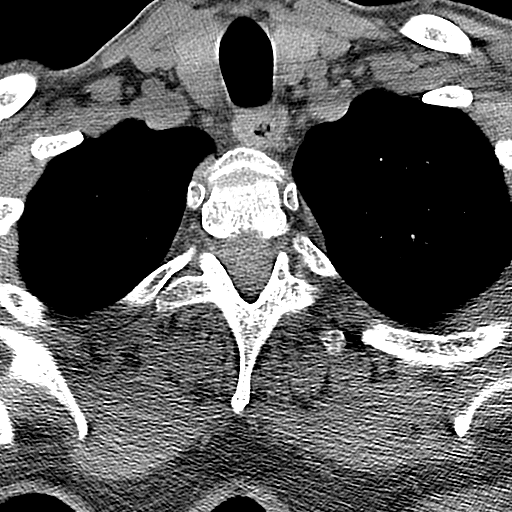
[im 16/92  bone]
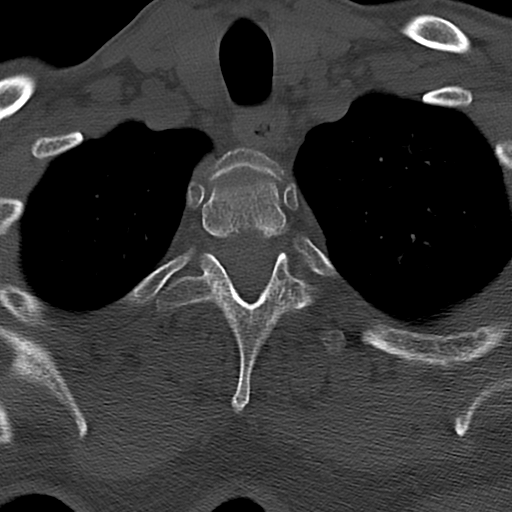
[im 31/92  bone]
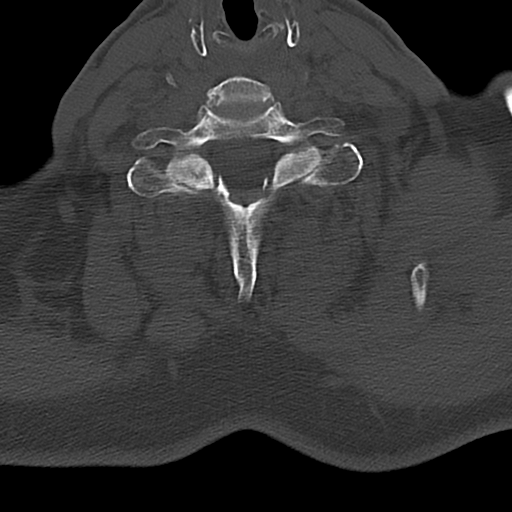
[im 61/92  bone]
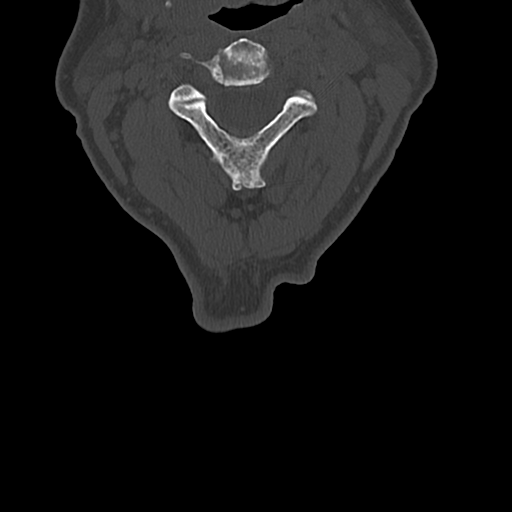
[im 76/92  bone]
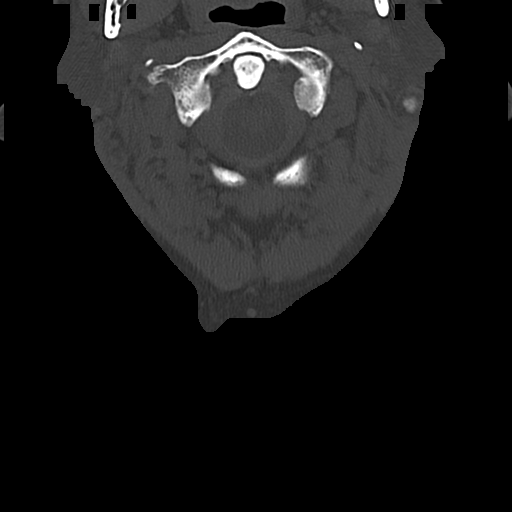

[Series 5: c-spine 2.00 br60 s3 sag bone · sagittal · 0.27mm/px · 5 of 69 slices shown, 6 images]
[im 23/69  bone]
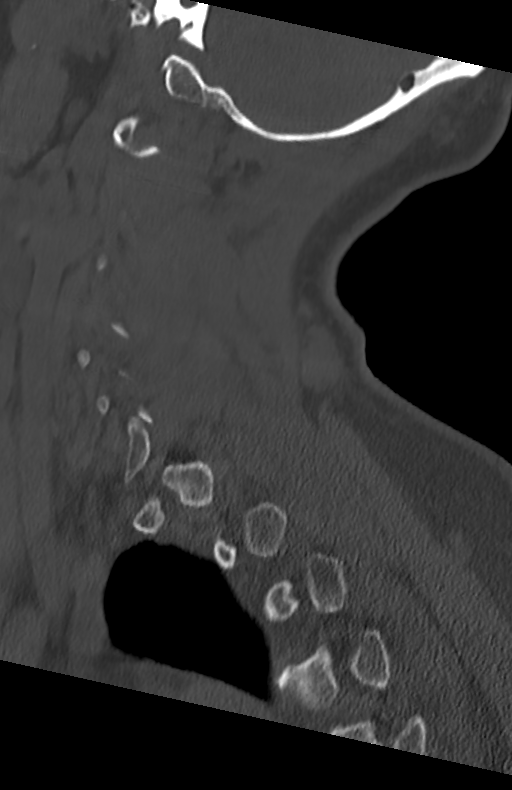
[im 29/69  bone]
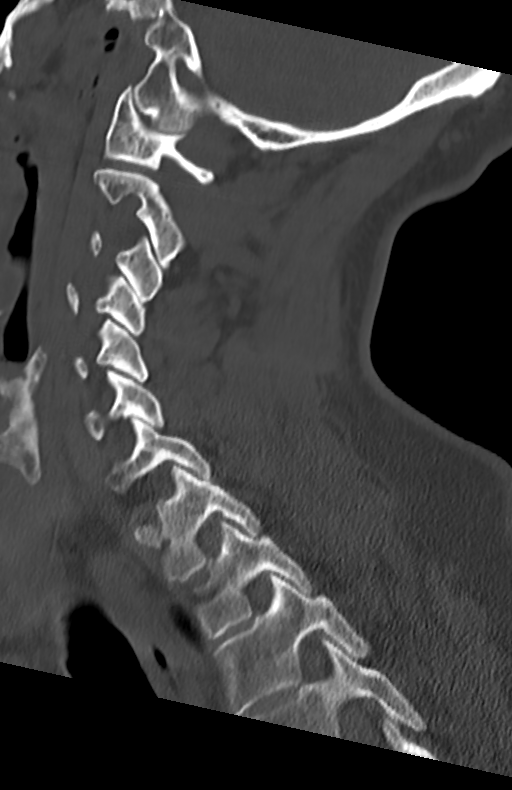
[im 35/69  soft-tissue]
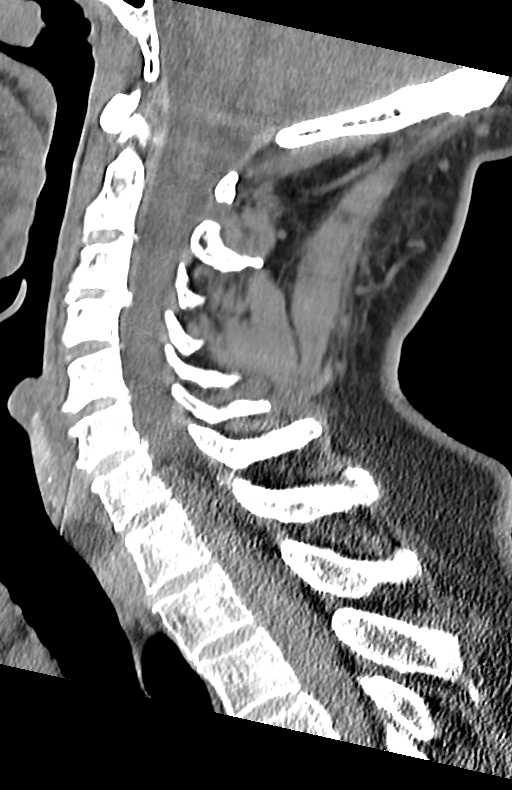
[im 35/69  bone]
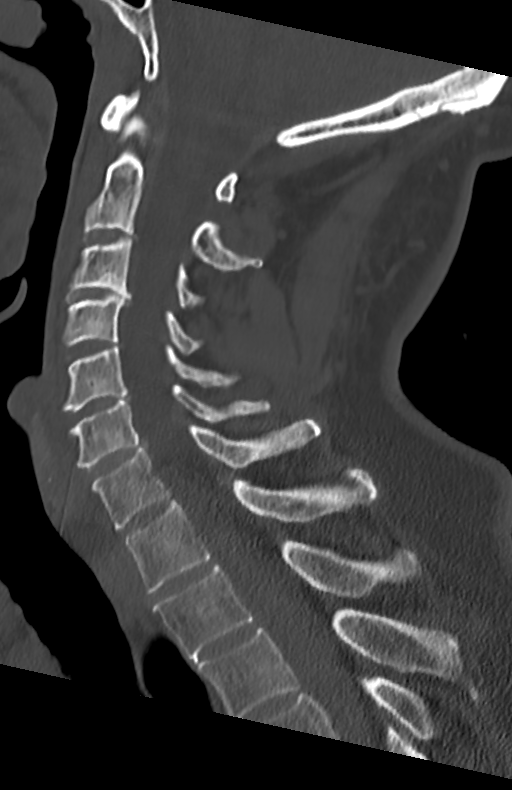
[im 40/69  bone]
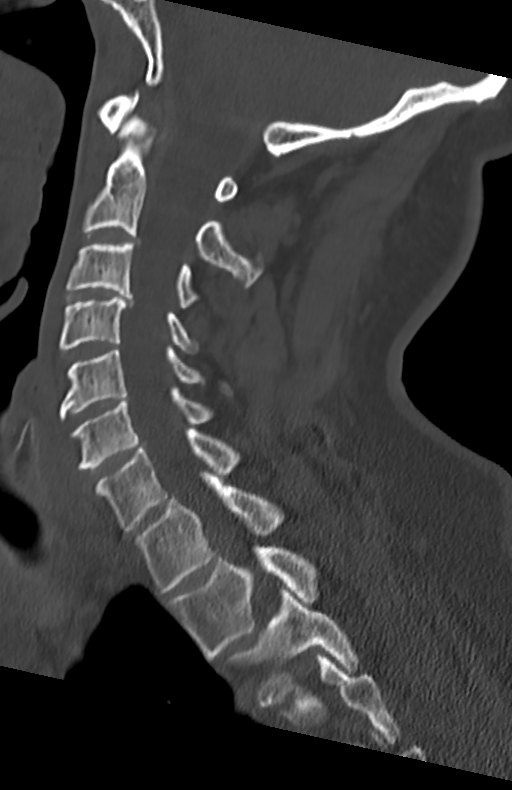
[im 46/69  bone]
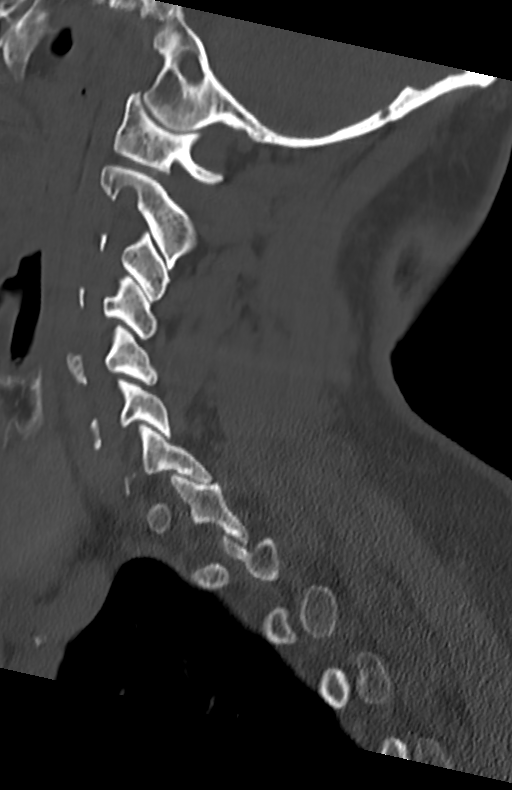

[Series 7: c-spine 2.00 hr60 s3 cor bone · coronal · 0.27mm/px · 3 of 69 slices shown]
[im 14/69  bone]
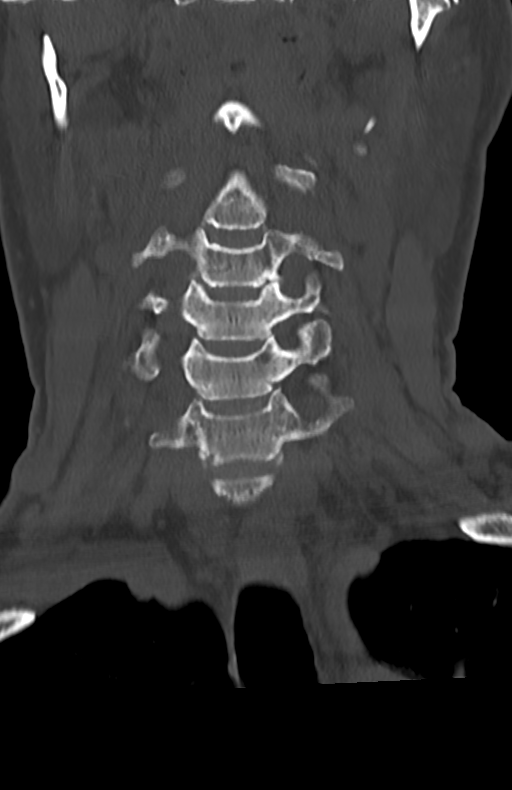
[im 28/69  bone]
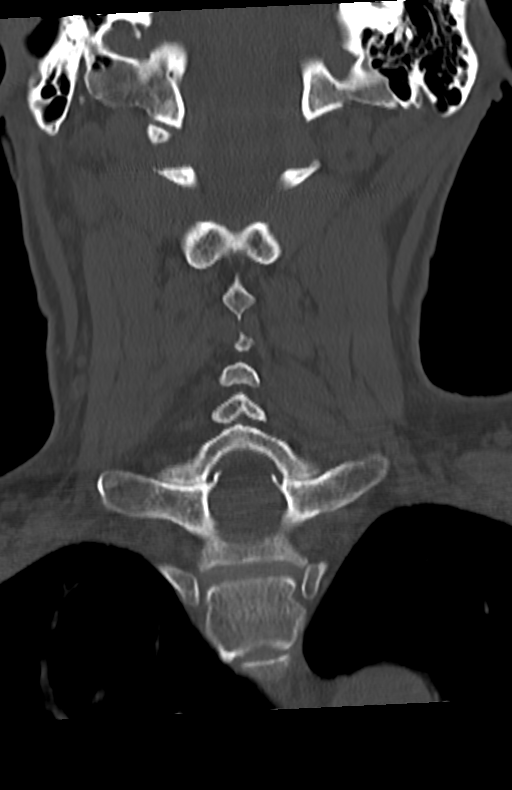
[im 41/69  bone]
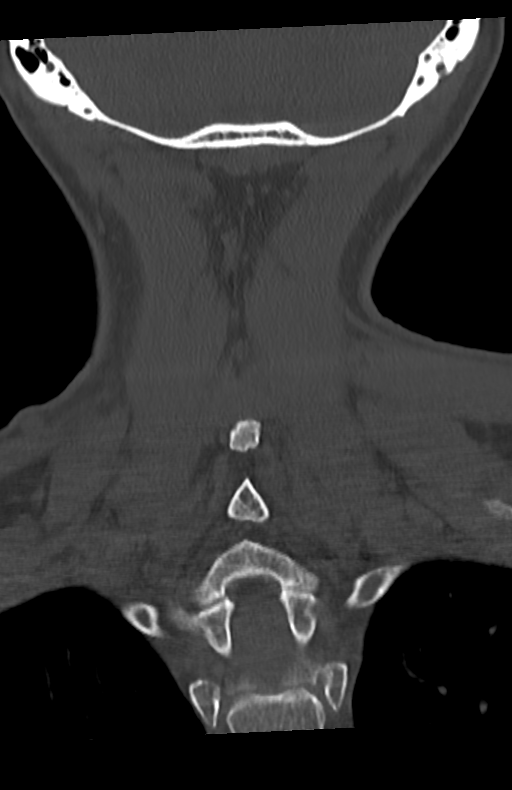

[12 of 33 positions shown; findings below may reference images not displayed]

FINDINGS: Alignment: Facet joints are aligned without dislocation. Dens and
lateral masses are aligned. No static listhesis. Normal cervical
lordosis.

Skull base and vertebrae: No acute fracture. No primary bone lesion
or focal pathologic process.

Soft tissues and spinal canal: No prevertebral fluid or swelling. No
visible canal hematoma.

Disc levels: Small posterior disc osteophyte complex at C3-4 and
C5-6. No evidence of high-grade canal stenosis by CT. Mild facet and
uncovertebral arthropathy most pronounced bilaterally at C3-4 where
there is likely at least mild bilateral foraminal stenosis.

Upper chest: Visualized lung apices clear.

Other: None.
IMPRESSION: 1. No fracture or static listhesis.
2. Mild cervical spondylosis, most pronounced at C3-4 where there is
likely at least mild bilateral foraminal stenosis. No CT evidence of
high-grade canal stenosis.

## 2019-12-29 ENCOUNTER — Inpatient Hospital Stay (HOSPITAL_COMMUNITY): Admission: RE | Admit: 2019-12-29 | Payer: Medicare Other | Source: Ambulatory Visit

## 2019-12-31 ENCOUNTER — Other Ambulatory Visit (HOSPITAL_COMMUNITY)
Admission: RE | Admit: 2019-12-31 | Discharge: 2019-12-31 | Disposition: A | Discharge status: Home / Self Care | Payer: Medicare Other | Source: Ambulatory Visit | Attending: Neurological Surgery | Admitting: Neurological Surgery

## 2019-12-31 DIAGNOSIS — Z01812 Encounter for preprocedural laboratory examination: Secondary | ICD-10-CM | POA: Diagnosis present

## 2019-12-31 DIAGNOSIS — Z20822 Contact with and (suspected) exposure to covid-19: Secondary | ICD-10-CM | POA: Insufficient documentation

## 2019-12-31 LAB — SARS CORONAVIRUS 2 (TAT 6-24 HRS): SARS Coronavirus 2: NEGATIVE

## 2020-01-01 NOTE — Progress Notes (Signed)
Kindly advise the patient that CT scan of the cervical spine shows no significant spinal stenosis or compression.  Mild wear-and-tear changes between the third and fourth vertebrae with slight narrowing of the foramina on either side.  Nothing to worry about

## 2020-01-02 ENCOUNTER — Ambulatory Visit (HOSPITAL_COMMUNITY): Payer: Medicare Other | Admitting: Vascular Surgery

## 2020-01-02 ENCOUNTER — Encounter (HOSPITAL_COMMUNITY)
Admission: RE | Disposition: A | Discharge status: Home / Self Care | Payer: Self-pay | Source: Ambulatory Visit | Attending: Neurological Surgery

## 2020-01-02 ENCOUNTER — Ambulatory Visit (HOSPITAL_COMMUNITY): Payer: Medicare Other

## 2020-01-02 ENCOUNTER — Other Ambulatory Visit: Payer: Self-pay

## 2020-01-02 ENCOUNTER — Ambulatory Visit (HOSPITAL_COMMUNITY)
Admission: RE | Admit: 2020-01-02 | Discharge: 2020-01-03 | Disposition: A | Discharge status: Home / Self Care | Payer: Medicare Other | Source: Ambulatory Visit | Attending: Neurological Surgery | Admitting: Neurological Surgery

## 2020-01-02 ENCOUNTER — Encounter (HOSPITAL_COMMUNITY): Payer: Self-pay | Admitting: Neurological Surgery

## 2020-01-02 ENCOUNTER — Ambulatory Visit (HOSPITAL_COMMUNITY): Payer: Medicare Other | Admitting: Anesthesiology

## 2020-01-02 DIAGNOSIS — F329 Major depressive disorder, single episode, unspecified: Secondary | ICD-10-CM | POA: Insufficient documentation

## 2020-01-02 DIAGNOSIS — E7800 Pure hypercholesterolemia, unspecified: Secondary | ICD-10-CM | POA: Diagnosis not present

## 2020-01-02 DIAGNOSIS — Z7902 Long term (current) use of antithrombotics/antiplatelets: Secondary | ICD-10-CM | POA: Insufficient documentation

## 2020-01-02 DIAGNOSIS — Z888 Allergy status to other drugs, medicaments and biological substances status: Secondary | ICD-10-CM | POA: Diagnosis not present

## 2020-01-02 DIAGNOSIS — Z8673 Personal history of transient ischemic attack (TIA), and cerebral infarction without residual deficits: Secondary | ICD-10-CM | POA: Insufficient documentation

## 2020-01-02 DIAGNOSIS — M545 Low back pain: Secondary | ICD-10-CM | POA: Diagnosis not present

## 2020-01-02 DIAGNOSIS — Z79890 Hormone replacement therapy (postmenopausal): Secondary | ICD-10-CM | POA: Diagnosis not present

## 2020-01-02 DIAGNOSIS — M1990 Unspecified osteoarthritis, unspecified site: Secondary | ICD-10-CM | POA: Insufficient documentation

## 2020-01-02 DIAGNOSIS — G43909 Migraine, unspecified, not intractable, without status migrainosus: Secondary | ICD-10-CM | POA: Insufficient documentation

## 2020-01-02 DIAGNOSIS — T85113A Breakdown (mechanical) of implanted electronic neurostimulator, generator, initial encounter: Secondary | ICD-10-CM | POA: Diagnosis not present

## 2020-01-02 DIAGNOSIS — Z87891 Personal history of nicotine dependence: Secondary | ICD-10-CM | POA: Diagnosis not present

## 2020-01-02 DIAGNOSIS — Y752 Prosthetic and other implants, materials and neurological devices associated with adverse incidents: Secondary | ICD-10-CM | POA: Diagnosis not present

## 2020-01-02 DIAGNOSIS — Z79899 Other long term (current) drug therapy: Secondary | ICD-10-CM | POA: Insufficient documentation

## 2020-01-02 DIAGNOSIS — Z885 Allergy status to narcotic agent status: Secondary | ICD-10-CM | POA: Insufficient documentation

## 2020-01-02 DIAGNOSIS — Z9889 Other specified postprocedural states: Secondary | ICD-10-CM

## 2020-01-02 DIAGNOSIS — K219 Gastro-esophageal reflux disease without esophagitis: Secondary | ICD-10-CM | POA: Insufficient documentation

## 2020-01-02 DIAGNOSIS — Z9884 Bariatric surgery status: Secondary | ICD-10-CM | POA: Insufficient documentation

## 2020-01-02 HISTORY — PX: SPINAL CORD STIMULATOR REMOVAL: SHX5379

## 2020-01-02 SURGERY — LUMBAR SPINAL CORD STIMULATOR REMOVAL
Anesthesia: General | Site: Back

## 2020-01-02 MED ORDER — CEFAZOLIN SODIUM-DEXTROSE 2-4 GM/100ML-% IV SOLN
2.0000 g | INTRAVENOUS | Status: DC
  Filled 2020-01-02: qty 100

## 2020-01-02 MED ORDER — SUCCINYLCHOLINE CHLORIDE 200 MG/10ML IV SOSY
PREFILLED_SYRINGE | INTRAVENOUS | Status: AC
  Filled 2020-01-02: qty 10

## 2020-01-02 MED ORDER — TOPIRAMATE 100 MG PO TABS
100.0000 mg | ORAL_TABLET | Freq: Every day | ORAL | Status: DC
  Administered 2020-01-02: 100 mg via ORAL
  Filled 2020-01-02: qty 1

## 2020-01-02 MED ORDER — BUPIVACAINE HCL (PF) 0.25 % IJ SOLN
INTRAMUSCULAR | Status: DC | PRN
  Administered 2020-01-02: 10 mL

## 2020-01-02 MED ORDER — POTASSIUM CHLORIDE IN NACL 20-0.9 MEQ/L-% IV SOLN: INTRAVENOUS | Status: DC

## 2020-01-02 MED ORDER — THROMBIN 20000 UNITS EX SOLR: CUTANEOUS | Status: DC | PRN

## 2020-01-02 MED ORDER — EPHEDRINE 5 MG/ML INJ
INTRAVENOUS | Status: AC
  Filled 2020-01-02: qty 10

## 2020-01-02 MED ORDER — LACTATED RINGERS IV SOLN: INTRAVENOUS | Status: DC

## 2020-01-02 MED ORDER — LIDOCAINE 2% (20 MG/ML) 5 ML SYRINGE
INTRAMUSCULAR | Status: AC
  Filled 2020-01-02: qty 5

## 2020-01-02 MED ORDER — HYDROMORPHONE HCL 1 MG/ML IJ SOLN
0.2500 mg | INTRAMUSCULAR | Status: DC | PRN
  Administered 2020-01-02 (×2): 0.5 mg via INTRAVENOUS

## 2020-01-02 MED ORDER — PROPOFOL 10 MG/ML IV BOLUS
INTRAVENOUS | Status: AC
  Filled 2020-01-02: qty 20

## 2020-01-02 MED ORDER — ONDANSETRON HCL 4 MG/2ML IJ SOLN
INTRAMUSCULAR | Status: DC | PRN
  Administered 2020-01-02: 4 mg via INTRAVENOUS

## 2020-01-02 MED ORDER — DEXAMETHASONE SODIUM PHOSPHATE 10 MG/ML IJ SOLN
INTRAMUSCULAR | Status: AC
  Filled 2020-01-02: qty 1

## 2020-01-02 MED ORDER — THROMBIN 5000 UNITS EX SOLR
CUTANEOUS | Status: AC
  Filled 2020-01-02: qty 10000

## 2020-01-02 MED ORDER — SODIUM CHLORIDE 0.9 % IV SOLN: 250.0000 mL | INTRAVENOUS | Status: DC

## 2020-01-02 MED ORDER — ONDANSETRON HCL 4 MG/2ML IJ SOLN
INTRAMUSCULAR | Status: AC
  Filled 2020-01-02: qty 2

## 2020-01-02 MED ORDER — SODIUM CHLORIDE 0.9 % IV SOLN: INTRAVENOUS | Status: DC | PRN

## 2020-01-02 MED ORDER — PHENOL 1.4 % MT LIQD: 1.0000 | OROMUCOSAL | Status: DC | PRN

## 2020-01-02 MED ORDER — MORPHINE SULFATE (PF) 2 MG/ML IV SOLN
2.0000 mg | INTRAVENOUS | Status: DC | PRN
  Administered 2020-01-02 – 2020-01-03 (×4): 2 mg via INTRAVENOUS
  Filled 2020-01-02 (×4): qty 1

## 2020-01-02 MED ORDER — HYDROMORPHONE HCL 1 MG/ML IJ SOLN
INTRAMUSCULAR | Status: AC
  Filled 2020-01-02: qty 1

## 2020-01-02 MED ORDER — CHLORHEXIDINE GLUCONATE CLOTH 2 % EX PADS: 6.0000 | MEDICATED_PAD | Freq: Once | CUTANEOUS | Status: DC

## 2020-01-02 MED ORDER — ALBUTEROL SULFATE HFA 108 (90 BASE) MCG/ACT IN AERS: 2.0000 | INHALATION_SPRAY | RESPIRATORY_TRACT | Status: DC | PRN

## 2020-01-02 MED ORDER — DEXAMETHASONE SODIUM PHOSPHATE 4 MG/ML IJ SOLN
INTRAMUSCULAR | Status: DC | PRN
  Administered 2020-01-02: 10 mg via INTRAVENOUS

## 2020-01-02 MED ORDER — SUCCINYLCHOLINE CHLORIDE 20 MG/ML IJ SOLN
INTRAMUSCULAR | Status: DC | PRN
  Administered 2020-01-02: 120 mg via INTRAVENOUS

## 2020-01-02 MED ORDER — ROCURONIUM BROMIDE 10 MG/ML (PF) SYRINGE
PREFILLED_SYRINGE | INTRAVENOUS | Status: AC
  Filled 2020-01-02: qty 10

## 2020-01-02 MED ORDER — TAPENTADOL HCL 50 MG PO TABS
75.0000 mg | ORAL_TABLET | Freq: Four times a day (QID) | ORAL | Status: DC
  Administered 2020-01-02 (×3): 75 mg via ORAL
  Filled 2020-01-02 (×5): qty 2

## 2020-01-02 MED ORDER — EPHEDRINE SULFATE-NACL 50-0.9 MG/10ML-% IV SOSY
PREFILLED_SYRINGE | INTRAVENOUS | Status: DC | PRN
  Administered 2020-01-02 (×2): 10 mg via INTRAVENOUS

## 2020-01-02 MED ORDER — CEFAZOLIN SODIUM-DEXTROSE 1-4 GM/50ML-% IV SOLN
1.0000 g | Freq: Three times a day (TID) | INTRAVENOUS | Status: AC
  Administered 2020-01-02 (×2): 1 g via INTRAVENOUS
  Filled 2020-01-02 (×2): qty 50

## 2020-01-02 MED ORDER — PROPOFOL 10 MG/ML IV BOLUS
INTRAVENOUS | Status: DC | PRN
  Administered 2020-01-02: 140 mg via INTRAVENOUS

## 2020-01-02 MED ORDER — ROCURONIUM BROMIDE 10 MG/ML (PF) SYRINGE
PREFILLED_SYRINGE | INTRAVENOUS | Status: DC | PRN
  Administered 2020-01-02: 30 mg via INTRAVENOUS

## 2020-01-02 MED ORDER — TRAZODONE HCL 100 MG PO TABS
100.0000 mg | ORAL_TABLET | Freq: Every day | ORAL | Status: DC
  Administered 2020-01-02: 21:00:00 100 mg via ORAL
  Filled 2020-01-02: qty 1

## 2020-01-02 MED ORDER — VANCOMYCIN HCL IN DEXTROSE 1-5 GM/200ML-% IV SOLN
1000.0000 mg | INTRAVENOUS | Status: AC
  Administered 2020-01-02: 1000 mg via INTRAVENOUS
  Filled 2020-01-02: qty 200

## 2020-01-02 MED ORDER — SERTRALINE HCL 50 MG PO TABS
100.0000 mg | ORAL_TABLET | Freq: Every day | ORAL | Status: DC
  Administered 2020-01-02: 100 mg via ORAL
  Filled 2020-01-02: qty 2

## 2020-01-02 MED ORDER — BUPIVACAINE HCL (PF) 0.25 % IJ SOLN
INTRAMUSCULAR | Status: AC
  Filled 2020-01-02: qty 30

## 2020-01-02 MED ORDER — BACLOFEN 10 MG PO TABS
10.0000 mg | ORAL_TABLET | Freq: Every day | ORAL | Status: DC | PRN
  Administered 2020-01-02 – 2020-01-03 (×2): 10 mg via ORAL
  Filled 2020-01-02 (×3): qty 1

## 2020-01-02 MED ORDER — ACETAMINOPHEN 325 MG PO TABS: 650.0000 mg | ORAL_TABLET | ORAL | Status: DC | PRN

## 2020-01-02 MED ORDER — PROMETHAZINE HCL 25 MG/ML IJ SOLN: 6.2500 mg | INTRAMUSCULAR | Status: DC | PRN

## 2020-01-02 MED ORDER — THROMBIN 5000 UNITS EX SOLR: OROMUCOSAL | Status: DC | PRN

## 2020-01-02 MED ORDER — ACETAMINOPHEN 650 MG RE SUPP: 650.0000 mg | RECTAL | Status: DC | PRN

## 2020-01-02 MED ORDER — FENTANYL CITRATE (PF) 250 MCG/5ML IJ SOLN
INTRAMUSCULAR | Status: AC
  Filled 2020-01-02: qty 5

## 2020-01-02 MED ORDER — FENTANYL CITRATE (PF) 100 MCG/2ML IJ SOLN
INTRAMUSCULAR | Status: DC | PRN
  Administered 2020-01-02 (×2): 50 ug via INTRAVENOUS
  Administered 2020-01-02: 100 ug via INTRAVENOUS
  Administered 2020-01-02: 50 ug via INTRAVENOUS

## 2020-01-02 MED ORDER — SENNA 8.6 MG PO TABS
1.0000 | ORAL_TABLET | Freq: Two times a day (BID) | ORAL | Status: DC
  Administered 2020-01-02: 8.6 mg via ORAL
  Filled 2020-01-02: qty 1

## 2020-01-02 MED ORDER — ONDANSETRON HCL 4 MG PO TABS: 4.0000 mg | ORAL_TABLET | Freq: Four times a day (QID) | ORAL | Status: DC | PRN

## 2020-01-02 MED ORDER — MONTELUKAST SODIUM 10 MG PO TABS
10.0000 mg | ORAL_TABLET | Freq: Every day | ORAL | Status: DC
  Administered 2020-01-02: 10 mg via ORAL
  Filled 2020-01-02: qty 1

## 2020-01-02 MED ORDER — THROMBIN 20000 UNITS EX SOLR
CUTANEOUS | Status: AC
  Filled 2020-01-02: qty 20000

## 2020-01-02 MED ORDER — MEPERIDINE HCL 25 MG/ML IJ SOLN: 6.2500 mg | INTRAMUSCULAR | Status: DC | PRN

## 2020-01-02 MED ORDER — LIDOCAINE 2% (20 MG/ML) 5 ML SYRINGE
INTRAMUSCULAR | Status: DC | PRN
  Administered 2020-01-02: 100 mg via INTRAVENOUS

## 2020-01-02 MED ORDER — VANCOMYCIN HCL 1000 MG IV SOLR
INTRAVENOUS | Status: AC
  Filled 2020-01-02: qty 1000

## 2020-01-02 MED ORDER — SUGAMMADEX SODIUM 200 MG/2ML IV SOLN
INTRAVENOUS | Status: DC | PRN
  Administered 2020-01-02: 200 mg via INTRAVENOUS

## 2020-01-02 MED ORDER — 0.9 % SODIUM CHLORIDE (POUR BTL) OPTIME
TOPICAL | Status: DC | PRN
  Administered 2020-01-02: 1000 mL

## 2020-01-02 MED ORDER — MIDAZOLAM HCL 2 MG/2ML IJ SOLN
INTRAMUSCULAR | Status: AC
  Filled 2020-01-02: qty 2

## 2020-01-02 MED ORDER — MENTHOL 3 MG MT LOZG
1.0000 | LOZENGE | OROMUCOSAL | Status: DC | PRN
  Administered 2020-01-02: 3 mg via ORAL
  Filled 2020-01-02: qty 9

## 2020-01-02 MED ORDER — ONDANSETRON HCL 4 MG/2ML IJ SOLN: 4.0000 mg | Freq: Four times a day (QID) | INTRAMUSCULAR | Status: DC | PRN

## 2020-01-02 MED ORDER — MIDAZOLAM HCL 5 MG/5ML IJ SOLN
INTRAMUSCULAR | Status: DC | PRN
  Administered 2020-01-02: 2 mg via INTRAVENOUS

## 2020-01-02 MED ORDER — SODIUM CHLORIDE 0.9% FLUSH: 3.0000 mL | INTRAVENOUS | Status: DC | PRN

## 2020-01-02 MED ORDER — SODIUM CHLORIDE 0.9% FLUSH
3.0000 mL | Freq: Two times a day (BID) | INTRAVENOUS | Status: DC
  Administered 2020-01-02: 3 mL via INTRAVENOUS

## 2020-01-02 SURGICAL SUPPLY — 52 items
BAG DECANTER FOR FLEXI CONT (MISCELLANEOUS) ×3 IMPLANT
BENZOIN TINCTURE PRP APPL 2/3 (GAUZE/BANDAGES/DRESSINGS) ×5 IMPLANT
BUR MATCHSTICK NEURO 3.0 LAGG (BURR) ×2 IMPLANT
CABLE BIPOLOR RESECTION CORD (MISCELLANEOUS) ×2 IMPLANT
CANISTER SUCT 3000ML PPV (MISCELLANEOUS) ×3 IMPLANT
CLOSURE WOUND 1/2 X4 (GAUZE/BANDAGES/DRESSINGS) ×2
COVER WAND RF STERILE (DRAPES) ×1 IMPLANT
DERMABOND ADVANCED (GAUZE/BANDAGES/DRESSINGS) ×4
DERMABOND ADVANCED .7 DNX12 (GAUZE/BANDAGES/DRESSINGS) IMPLANT
DRAPE C-ARM 42X72 X-RAY (DRAPES) ×2 IMPLANT
DRAPE LAPAROTOMY 100X72X124 (DRAPES) ×3 IMPLANT
DRAPE SURG 17X23 STRL (DRAPES) ×9 IMPLANT
DRSG OPSITE POSTOP 4X6 (GAUZE/BANDAGES/DRESSINGS) ×4 IMPLANT
DURAPREP 26ML APPLICATOR (WOUND CARE) ×3 IMPLANT
ELECT REM PT RETURN 9FT ADLT (ELECTROSURGICAL) ×3
ELECTRODE REM PT RTRN 9FT ADLT (ELECTROSURGICAL) ×1 IMPLANT
GAUZE 4X4 16PLY RFD (DISPOSABLE) IMPLANT
GLOVE BIO SURGEON STRL SZ7 (GLOVE) ×2 IMPLANT
GLOVE BIO SURGEON STRL SZ8 (GLOVE) ×3 IMPLANT
GLOVE BIOGEL PI IND STRL 6.5 (GLOVE) IMPLANT
GLOVE BIOGEL PI IND STRL 7.0 (GLOVE) IMPLANT
GLOVE BIOGEL PI IND STRL 7.5 (GLOVE) IMPLANT
GLOVE BIOGEL PI INDICATOR 6.5 (GLOVE) ×2
GLOVE BIOGEL PI INDICATOR 7.0 (GLOVE) ×2
GLOVE BIOGEL PI INDICATOR 7.5 (GLOVE) ×2
GLOVE SURG SS PI 6.0 STRL IVOR (GLOVE) ×6 IMPLANT
GOWN STRL REUS W/ TWL LRG LVL3 (GOWN DISPOSABLE) IMPLANT
GOWN STRL REUS W/ TWL XL LVL3 (GOWN DISPOSABLE) ×1 IMPLANT
GOWN STRL REUS W/TWL 2XL LVL3 (GOWN DISPOSABLE) IMPLANT
GOWN STRL REUS W/TWL LRG LVL3 (GOWN DISPOSABLE) ×4
GOWN STRL REUS W/TWL XL LVL3 (GOWN DISPOSABLE) ×2
HEMOSTAT POWDER KIT SURGIFOAM (HEMOSTASIS) ×2 IMPLANT
KIT BASIN OR (CUSTOM PROCEDURE TRAY) ×3 IMPLANT
KIT TURNOVER KIT B (KITS) ×3 IMPLANT
NDL HYPO 25X1 1.5 SAFETY (NEEDLE) ×1 IMPLANT
NDL SPNL 20GX3.5 QUINCKE YW (NEEDLE) IMPLANT
NEEDLE HYPO 25X1 1.5 SAFETY (NEEDLE) ×3 IMPLANT
NEEDLE SPNL 20GX3.5 QUINCKE YW (NEEDLE) ×3 IMPLANT
NS IRRIG 1000ML POUR BTL (IV SOLUTION) ×3 IMPLANT
PACK LAMINECTOMY NEURO (CUSTOM PROCEDURE TRAY) ×3 IMPLANT
PAD ARMBOARD 7.5X6 YLW CONV (MISCELLANEOUS) ×11 IMPLANT
RUBBERBAND STERILE (MISCELLANEOUS) ×2 IMPLANT
SPONGE SURGIFOAM ABS GEL 100 (HEMOSTASIS) ×2 IMPLANT
STAPLER VISISTAT 35W (STAPLE) ×2 IMPLANT
STRIP CLOSURE SKIN 1/2X4 (GAUZE/BANDAGES/DRESSINGS) ×3 IMPLANT
SUT VIC AB 0 CT1 18XCR BRD8 (SUTURE) ×1 IMPLANT
SUT VIC AB 0 CT1 8-18 (SUTURE) ×4
SUT VIC AB 2-0 CP2 18 (SUTURE) ×5 IMPLANT
SUT VIC AB 3-0 SH 8-18 (SUTURE) ×7 IMPLANT
TOWEL GREEN STERILE (TOWEL DISPOSABLE) ×3 IMPLANT
TOWEL GREEN STERILE FF (TOWEL DISPOSABLE) ×3 IMPLANT
WATER STERILE IRR 1000ML POUR (IV SOLUTION) ×3 IMPLANT

## 2020-01-02 NOTE — Transfer of Care (Signed)
Immediate Anesthesia Transfer of Care Note  Patient: Ryan Nicholson  Procedure(s) Performed: Spinal cord stimulator removal  Lumbar (N/A Back)  Patient Location: PACU  Anesthesia Type:General  Level of Consciousness: awake  Airway & Oxygen Therapy: Patient Spontanous Breathing and Patient connected to face mask oxygen  Post-op Assessment: Report given to RN and Post -op Vital signs reviewed and stable  Post vital signs: Reviewed and stable  Last Vitals:  Vitals Value Taken Time  BP 112/87 01/02/20 1042  Temp 36.4 C 01/02/20 1040  Pulse 50 01/02/20 1044  Resp 15 01/02/20 1044  SpO2 100 % 01/02/20 1044  Vitals shown include unvalidated device data.  Last Pain:  Vitals:   01/02/20 0734  PainSc: 0-No pain         Complications: No apparent anesthesia complications

## 2020-01-02 NOTE — H&P (Signed)
Subjective: Patient is a 59 y.o. male admitted for SCS removal. Onset of symptoms was several months ago, unchanged since that time.  The pain is rated mild, and is located at the across the lower back. The pain is described as aching and occurs intermittently. The symptoms have been progressive. Symptoms are exacerbated by nothing in particular. SCS has stopped functioning and he has symptoms that may need an MRI for work up and he would like the SCS removed so he can proceed with work up with his neurologist.    Past Medical History:  Diagnosis Date  . B12 deficiency   . Depression   . Dizziness   . DJD (degenerative joint disease)   . Gastric ulcer    hx of  . GERD (gastroesophageal reflux disease)   . History of kidney stones   . Hypercholesteremia   . Migraine   . Pneumonia   . Post herpetic neuralgia   . Stroke [facility])     Past Surgical History:  Procedure Laterality Date  . BACK SURGERY  09/1999, 01/2001  . CHOLECYSTECTOMY  2005  . COLON SURGERY    . EYE SURGERY Right    cataract removal  . GASTRIC BYPASS  03/2004  . HEMORRHOID SURGERY  2010  . HERNIA REPAIR    . KNEE SURGERY  1980, 1985, 2015  . left shoulder surgery  2007  . LUNG SURGERY Left 2002  . PROSTATE SURGERY    . SPINE SURGERY     spinal stimulator  . TONSILECTOMY/ADENOIDECTOMY WITH MYRINGOTOMY    . TONSILLECTOMY      Prior to Admission medications   Medication Sig Start Date End Date Taking? Authorizing Provider  atorvastatin (LIPITOR) 40 MG tablet Take 40 mg by mouth daily.  05/18/19  Yes [provider]  baclofen (LIORESAL) 10 MG tablet Take 10 mg by mouth daily as needed for muscle spasms.    Yes [provider]  clopidogrel (PLAVIX) 75 MG tablet Take 75 mg by mouth daily.   Yes [provider]  cyanocobalamin (,VITAMIN B-12,) 1000 MCG/ML injection Inject 1 mL (1,000 mcg total) into the muscle every 30 (thirty) days. 06/03/17  Yes Shade Flood, MD  fluticasone (FLONASE) 50  MCG/ACT nasal spray Place 2 sprays into both nostrils daily. 06/03/17  Yes Shade Flood, MD  montelukast (SINGULAIR) 10 MG tablet Take 10 mg by mouth at bedtime.  06/07/19  Yes [provider]  ondansetron (ZOFRAN) 8 MG tablet Take 1 tablet (8 mg total) by mouth every 8 (eight) hours as needed for nausea or vomiting. 06/03/17  Yes Shade Flood, MD  sertraline (ZOLOFT) 100 MG tablet Take 100 mg by mouth daily.   Yes [provider]  SUMAtriptan (IMITREX) 100 MG tablet Take 100 mg by mouth every 2 (two) hours as needed for migraine. May repeat in 2 hours if headache persists or recurs.   Yes [provider]  tapentadol HCl (NUCYNTA) 75 MG tablet Take 75 mg by mouth 4 (four) times daily.   Yes [provider]  testosterone cypionate (DEPOTESTOSTERONE CYPIONATE) 200 MG/ML injection Inject 200 mg into the muscle every 30 (thirty) days. 12/06/19  Yes [provider]  topiramate (TOPAMAX) 100 MG tablet Take 100 mg by mouth at bedtime.   Yes [provider]  traZODone (DESYREL) 100 MG tablet Take 100 mg by mouth at bedtime.  07/06/19  Yes [provider]  albuterol (PROVENTIL HFA;VENTOLIN HFA) 108 (90 Base) MCG/ACT inhaler Inhale 2  puffs into the lungs every 4 (four) hours as needed for wheezing or shortness of breath.    [provider]   Allergies  Allergen Reactions  . Oxycontin [Oxycodone] Other (See Comments)    Unknown  . Reglan [Metoclopramide] Nausea Only    shakey    Social History   Tobacco Use  . Smoking status: Former Smoker    Quit date: 02/14/2001    Years since quitting: 18.8  . Smokeless tobacco: Never Used  Substance Use Topics  . Alcohol use: No    Family History  Problem Relation Age of Onset  . Breast cancer Mother   . Diabetes Father   . Hypertension Father   . Stroke Father   . Parkinson's disease Father   . Brain cancer Maternal Uncle      Review of Systems  Positive ROS: neg  All other  systems have been reviewed and were otherwise negative with the exception of those mentioned in the HPI and as above.  Objective: Vital signs in last 24 hours: Temp:  [98.1 F (36.7 C)] 98.1 F (36.7 C) (04/07 0716) Pulse Rate:  [57] 57 (04/07 0716) Resp:  [20] 20 (04/07 0716) BP: (112)/(57) 112/57 (04/07 0716) SpO2:  [98 %] 98 % (04/07 0716) Weight:  [72.4 kg] 72.4 kg (04/07 0716)  General Appearance: Alert, cooperative, no distress, appears stated age Head: Normocephalic, without obvious abnormality, atraumatic Eyes: PERRL, conjunctiva/corneas clear, EOM's intact    Neck: Supple, symmetrical, trachea midline Back: Symmetric, no curvature, ROM normal, no CVA tenderness Lungs:  respirations unlabored Heart: Regular rate and rhythm Abdomen: Soft, non-tender Extremities: Extremities normal, atraumatic, no cyanosis or edema Pulses: 2+ and symmetric all extremities Skin: Skin color, texture, turgor normal, no rashes or lesions  NEUROLOGIC:   Mental status: Alert and oriented x4,  no aphasia, good attention span, fund of knowledge, and memory Motor Exam - grossly normal Sensory Exam - grossly normal Reflexes: 1= Coordination - grossly normal Gait - grossly normal Balance - grossly normal Cranial Nerves: I: smell Not tested  II: visual acuity  OS: nl    OD: nl  II: visual fields Full to confrontation  II: pupils Equal, round, reactive to light  III,VII: ptosis None  III,IV,VI: extraocular muscles  Full ROM  V: mastication Normal  V: facial light touch sensation  Normal  V,VII: corneal reflex  Present  VII: facial muscle function - upper  Normal  VII: facial muscle function - lower Normal  VIII: hearing Not tested  IX: soft palate elevation  Normal  IX,X: gag reflex Present  XI: trapezius strength  5/5  XI: sternocleidomastoid strength 5/5  XI: neck flexion strength  5/5  XII: tongue strength  Normal    Data Review Lab Results  Component Value Date   WBC 4.8  12/25/2019   HGB 12.9 (L) 12/25/2019   HCT 40.4 12/25/2019   MCV 92.9 12/25/2019   PLT 180 12/25/2019   Lab Results  Component Value Date   NA 141 12/25/2019   K 4.5 12/25/2019   CL 108 12/25/2019   CO2 24 12/25/2019   BUN 14 12/25/2019   CREATININE 1.17 12/25/2019   GLUCOSE 95 12/25/2019   Lab Results  Component Value Date   INR 1.1 05/17/2019    Assessment/Plan:  Estimated body mass index is 25 kg/m as calculated from the following:   Height as of this encounter: 5\' 7"  (1.702 m).   Weight as of this encounter: 72.4 kg. Patient admitted for  SCS removal.  I explained the condition and procedure to the patient and answered any questions.  Patient wishes to proceed with procedure as planned. Understands risks/ benefits and typical outcomes of procedure.   Tia Alert 01/02/2020 8:28 AM

## 2020-01-02 NOTE — Anesthesia Procedure Notes (Signed)
Procedure Name: Intubation Date/Time: 01/02/2020 8:44 AM Performed by: Caren Macadam, CRNA Pre-anesthesia Checklist: Patient identified, Emergency Drugs available, Suction available and Patient being monitored Patient Re-evaluated:Patient Re-evaluated prior to induction Oxygen Delivery Method: Circle system utilized Preoxygenation: Pre-oxygenation with 100% oxygen Induction Type: IV induction Ventilation: Mask ventilation without difficulty Laryngoscope Size: Miller and 2 Grade View: Grade I Tube type: Oral Tube size: 7.5 mm Number of attempts: 1 Airway Equipment and Method: Stylet Placement Confirmation: ETT inserted through vocal cords under direct vision,  positive ETCO2 and breath sounds checked- equal and bilateral Secured at: 22 cm Tube secured with: Tape Dental Injury: Teeth and Oropharynx as per pre-operative assessment

## 2020-01-02 NOTE — Op Note (Signed)
01/02/2020  10:32 AM  PATIENT:  Ryan Nicholson  59 y.o. male  PRE-OPERATIVE DIAGNOSIS: Nonfunctioning spinal cord stimulator  POST-OPERATIVE DIAGNOSIS:  same  PROCEDURE: T7 and T6 laminectomy for removal of spinal cord stimulator paddle lead, and removal of spinal cord stimulator pulse generator through a separate incision  SURGEON:  Marikay Alar, MD  ASSISTANTS: Verlin Dike, FNP  ANESTHESIA:   General  EBL: 50 ml  Total I/O In: 700 [I.V.:700] Out: 50 [Blood:50]  BLOOD ADMINISTERED: none  DRAINS: None  SPECIMEN:  none  INDICATION FOR PROCEDURE: This patient presented with a nonfunctioning spinal cord stimulator.  He was having neurologic symptoms for which he wished to have an MRI for work-up.  He saw his neurologist who agreed that he would like to have an MRI.  Therefore he felt it was important to have the nonfunctioning spinal cord stimulator completely removed.  Recommended removal of the spinal cord stimulator. Patient understood the risks, benefits, and alternatives and potential outcomes and wished to proceed.  PROCEDURE DETAILS: The patient was taken to the operating room and after induction of adequate generalized endotracheal anesthesia he was rolled into the prone position on the chest rolls and all pressure points were padded.  His thoracic and lumbar region was then cleaned with Betadine scrub and then prepped with DuraPrep and then draped in usual sterile fashion.  We opened the old incision over the thoracic area and found the wires and removed the tethers where they were sewn to the fascia.  We made a separate incision in the left flank over the battery and dissected bluntly around the battery until the battery could be removed from the pocket.  We then cut the wires and pulled the wires through the thoracic incision and cut the short.  The wires and the pulse generator were then given to the scrub nurse.  We then took down the paraspinous musculature in a  subperiosteal fashion to expose the T6 and T7 lamina.  We saw with the wires dipped under the lamina.  We remove the spinous processes and found that we had to perform laminectomies at T6 and T7 in order to expose the paddle and dissect it from the epidural fibrosis to remove it.  We gently performed laminectomies and medial facetectomies at T6 T7.  Remove the yellow ligament to identify the underlying paddle which was in the epidural space.  We gently lifted the wire and used microscissors to dissect the epidural fibrosis away from the inferior surface of the wires and the paddle.  We did this until the paddle was easily pulled very gently from the epidural space and completely removed.  We then prepared the pocket for closure.  We removed the slick scar tissue from the edges so that we would get better healing of the pocket.  This was done with Bovie cautery.  Then closed this incision with layers of 0 Vicryl, 2-0 Vicryl in the subcutaneous tissues and 3-0 Vicryl in the subcuticular tissues.  The skin was closed with benzoin and Steri-Strips.  In the thoracic area we spent considerable time drying the surgical bed from bleeding in both the epidural space and from the musculature.  We used Surgifoam in the epidural space and bone wax on the bony edges.  We dried the musculature.  We lined the dura with Gelfoam after irrigated with copious amounts of bacitracin-containing saline solution.  We closed the muscle and the fascia with 0 Vicryl.  We closed the subcutaneous tissues with 2-0  Vicryl and the subcuticular tissues with 3-0 Vicryl.  The skin was closed with benzoin and Steri-Strips.  Sterile dressings were applied to both incisions.  Patient was then awakened from general anesthesia and transferred recovery in a stable condition.  At the end of the procedure all sponge needle and instrument counts were correct.   PLAN OF CARE: Admit for overnight observation  PATIENT DISPOSITION:  PACU -  hemodynamically stable.   Delay start of Pharmacological VTE agent (>24hrs) due to surgical blood loss or risk of bleeding:  yes

## 2020-01-02 NOTE — Anesthesia Postprocedure Evaluation (Signed)
Anesthesia Post Note  Patient: Ryan Nicholson  Procedure(s) Performed: Spinal cord stimulator removal  Lumbar (N/A Back)     Patient location during evaluation: PACU Anesthesia Type: General Level of consciousness: awake and alert Pain management: pain level controlled Vital Signs Assessment: post-procedure vital signs reviewed and stable Respiratory status: spontaneous breathing, nonlabored ventilation and respiratory function stable Cardiovascular status: blood pressure returned to baseline and stable Postop Assessment: no apparent nausea or vomiting Anesthetic complications: no    Last Vitals:  Vitals:   01/02/20 1110 01/02/20 1125  BP: 112/72 109/66  Pulse: 60 61  Resp: 10 12  Temp:  36.4 C  SpO2: 99% 98%    Last Pain:  Vitals:   01/02/20 1150  PainSc: 6                  Lowella Curb

## 2020-01-03 ENCOUNTER — Encounter: Payer: Self-pay | Admitting: *Deleted

## 2020-01-03 DIAGNOSIS — T85113A Breakdown (mechanical) of implanted electronic neurostimulator, generator, initial encounter: Secondary | ICD-10-CM | POA: Diagnosis not present

## 2020-01-03 MED ORDER — NUCYNTA 100 MG PO TABS: 100.0000 mg | ORAL_TABLET | Freq: Four times a day (QID) | ORAL | 0 refills | Status: DC

## 2020-01-03 NOTE — Discharge Summary (Signed)
Physician Discharge Summary  Patient ID: Ryan Nicholson MRN: [id number] DOB/AGE: 1961-01-02 59 y.o.  Admit date: 01/02/2020 Discharge date: 01/03/2020  Admission Diagnoses: SCS end of life/ dysfunction   Discharge Diagnoses: same   Discharged Condition: good  Hospital Course: The patient was admitted on 01/02/2020 and taken to the operating room where the patient underwent SCS removal via laminectomy. The patient tolerated the procedure well and was taken to the recovery room and then to the floor in stable condition. The hospital course was routine. There were no complications. The wound remained clean dry and intact. Pt had appropriate back soreness. No complaints of leg pain or new N/T/W. The patient remained afebrile with stable vital signs, and tolerated a regular diet. The patient continued to increase activities, and pain was well controlled with oral pain medications.   Consults: None  Significant Diagnostic Studies:  Results for orders placed or performed during the hospital encounter of 12/31/19  SARS CORONAVIRUS 2 (TAT 6-24 HRS) Nasopharyngeal Nasopharyngeal Swab   Specimen: Nasopharyngeal Swab  Result Value Ref Range   SARS Coronavirus 2 NEGATIVE NEGATIVE    CT CERVICAL SPINE WO CONTRAST  Result Date: 12/28/2019 CLINICAL DATA:  Headaches, ataxia EXAM: CT CERVICAL SPINE WITHOUT CONTRAST TECHNIQUE: Multidetector CT imaging of the cervical spine was performed without intravenous contrast. Multiplanar CT image reconstructions were also generated. COMPARISON:  05/18/2019 FINDINGS: Alignment: Facet joints are aligned without dislocation. Dens and lateral masses are aligned. No static listhesis. Normal cervical lordosis. Skull base and vertebrae: No acute fracture. No primary bone lesion or focal pathologic process. Soft tissues and spinal canal: No prevertebral fluid or swelling. No visible canal hematoma. Disc levels: Small posterior disc osteophyte complex at C3-4 and C5-6. No evidence  of high-grade canal stenosis by CT. Mild facet and uncovertebral arthropathy most pronounced bilaterally at C3-4 where there is likely at least mild bilateral foraminal stenosis. Upper chest: Visualized lung apices clear. Other: None. IMPRESSION: 1. No fracture or static listhesis. 2. Mild cervical spondylosis, most pronounced at C3-4 where there is likely at least mild bilateral foraminal stenosis. No CT evidence of high-grade canal stenosis. Electronically Signed   By: Davina Poke D.O.   On: 12/28/2019 13:09   DG C-Arm 1-60 Min-No Report  Result Date: 01/02/2020 Fluoroscopy was utilized by the requesting physician.  No radiographic interpretation.    Antibiotics:  Anti-infectives (From admission, onward)   Start     Dose/Rate Route Frequency Ordered Stop   01/02/20 1530  ceFAZolin (ANCEF) IVPB 1 g/50 mL premix     1 g 100 mL/hr over 30 Minutes Intravenous Every 8 hours 01/02/20 1142 01/02/20 2359   01/02/20 0920  bacitracin 50,000 Units in sodium chloride 0.9 % 500 mL irrigation  Status:  Discontinued       As needed 01/02/20 0920 01/02/20 1037   01/02/20 0715  ceFAZolin (ANCEF) IVPB 2g/100 mL premix  Status:  Discontinued     2 g 200 mL/hr over 30 Minutes Intravenous On call to O.R. 01/02/20 0714 01/02/20 1140   01/02/20 0714  vancomycin (VANCOCIN) IVPB 1000 mg/200 mL premix     1,000 mg 200 mL/hr over 60 Minutes Intravenous 60 min pre-op 01/02/20 0714 01/02/20 0838      Discharge Exam: Blood pressure 94/71, pulse (!) 58, temperature 98.2 F (36.8 C), temperature source Oral, resp. rate 18, height 5\' 7"  (1.702 m), weight 72.4 kg, SpO2 100 %. Neurologic: Grossly normal Dressings dry  Discharge Medications:   Allergies as of 01/03/2020  Reactions   Oxycontin [oxycodone] Other (See Comments)   Unknown   Reglan [metoclopramide] Nausea Only   shakey      Medication List    TAKE these medications   albuterol 108 (90 Base) MCG/ACT inhaler Commonly known as: VENTOLIN  HFA Inhale 2 puffs into the lungs every 4 (four) hours as needed for wheezing or shortness of breath.   atorvastatin 40 MG tablet Commonly known as: LIPITOR Take 40 mg by mouth daily.   baclofen 10 MG tablet Commonly known as: LIORESAL Take 10 mg by mouth daily as needed for muscle spasms.   clopidogrel 75 MG tablet Commonly known as: PLAVIX Take 75 mg by mouth daily.   cyanocobalamin 1000 MCG/ML injection Commonly known as: (VITAMIN B-12) Inject 1 mL (1,000 mcg total) into the muscle every 30 (thirty) days.   fluticasone 50 MCG/ACT nasal spray Commonly known as: FLONASE Place 2 sprays into both nostrils daily.   montelukast 10 MG tablet Commonly known as: SINGULAIR Take 10 mg by mouth at bedtime.   Nucynta 100 MG Tabs Generic drug: Tapentadol HCl Take 1 tablet (100 mg total) by mouth in the morning, at noon, in the evening, and at bedtime. What changed:   medication strength  how much to take  when to take this   ondansetron 8 MG tablet Commonly known as: ZOFRAN Take 1 tablet (8 mg total) by mouth every 8 (eight) hours as needed for nausea or vomiting.   sertraline 100 MG tablet Commonly known as: ZOLOFT Take 100 mg by mouth daily.   SUMAtriptan 100 MG tablet Commonly known as: IMITREX Take 100 mg by mouth every 2 (two) hours as needed for migraine. May repeat in 2 hours if headache persists or recurs.   testosterone cypionate 200 MG/ML injection Commonly known as: DEPOTESTOSTERONE CYPIONATE Inject 200 mg into the muscle every 30 (thirty) days.   topiramate 100 MG tablet Commonly known as: TOPAMAX Take 100 mg by mouth at bedtime.   traZODone 100 MG tablet Commonly known as: DESYREL Take 100 mg by mouth at bedtime.       Disposition: home   Final Dx: removal of SCS  Discharge Instructions     Remove dressing in 72 hours   Complete by: As directed    Call MD for:  difficulty breathing, headache or visual disturbances   Complete by: As directed     Call MD for:  hives   Complete by: As directed    Call MD for:  persistant nausea and vomiting   Complete by: As directed    Call MD for:  redness, tenderness, or signs of infection (pain, swelling, redness, odor or green/yellow discharge around incision site)   Complete by: As directed    Call MD for:  severe uncontrolled pain   Complete by: As directed    Diet - low sodium heart healthy   Complete by: As directed    Increase activity slowly   Complete by: As directed          Signed: Tia Alert 01/03/2020, 8:16 AM

## 2020-01-03 NOTE — Plan of Care (Signed)
Pt given D/C instructions with verbal understanding. Rx was sent to pharmacy by MD. Pt's incisions are stained but have no sign of infection. Pt's IV was removed prior to D/C. Pt D/C'd home via wheelchair per MD order. Pt is stable @ D/C and has no other needs at this time. Rema Fendt, RN

## 2020-02-04 ENCOUNTER — Encounter (HOSPITAL_COMMUNITY)
Admission: RE | Disposition: A | Discharge status: Home / Self Care | Payer: Self-pay | Source: Ambulatory Visit | Attending: Urology

## 2020-02-04 ENCOUNTER — Other Ambulatory Visit (HOSPITAL_COMMUNITY): Payer: Self-pay | Admitting: Urology

## 2020-02-04 ENCOUNTER — Observation Stay (HOSPITAL_COMMUNITY)
Admission: RE | Admit: 2020-02-04 | Discharge: 2020-02-05 | Disposition: A | Discharge status: Home / Self Care | Payer: Medicare Other | Source: Ambulatory Visit | Attending: Urology | Admitting: Urology

## 2020-02-04 ENCOUNTER — Other Ambulatory Visit: Payer: Self-pay

## 2020-02-04 ENCOUNTER — Encounter (HOSPITAL_COMMUNITY): Payer: Self-pay | Admitting: Urology

## 2020-02-04 ENCOUNTER — Observation Stay (HOSPITAL_COMMUNITY): Payer: Medicare Other

## 2020-02-04 ENCOUNTER — Other Ambulatory Visit: Payer: Self-pay | Admitting: Urology

## 2020-02-04 ENCOUNTER — Observation Stay (HOSPITAL_COMMUNITY): Payer: Medicare Other | Admitting: Certified Registered Nurse Anesthetist

## 2020-02-04 DIAGNOSIS — N401 Enlarged prostate with lower urinary tract symptoms: Secondary | ICD-10-CM | POA: Diagnosis not present

## 2020-02-04 DIAGNOSIS — K219 Gastro-esophageal reflux disease without esophagitis: Secondary | ICD-10-CM | POA: Diagnosis not present

## 2020-02-04 DIAGNOSIS — E7800 Pure hypercholesterolemia, unspecified: Secondary | ICD-10-CM | POA: Diagnosis not present

## 2020-02-04 DIAGNOSIS — Z79899 Other long term (current) drug therapy: Secondary | ICD-10-CM | POA: Insufficient documentation

## 2020-02-04 DIAGNOSIS — Z20822 Contact with and (suspected) exposure to covid-19: Secondary | ICD-10-CM | POA: Insufficient documentation

## 2020-02-04 DIAGNOSIS — Z9884 Bariatric surgery status: Secondary | ICD-10-CM | POA: Diagnosis not present

## 2020-02-04 DIAGNOSIS — N201 Calculus of ureter: Secondary | ICD-10-CM

## 2020-02-04 DIAGNOSIS — Z8719 Personal history of other diseases of the digestive system: Secondary | ICD-10-CM | POA: Diagnosis not present

## 2020-02-04 DIAGNOSIS — Z885 Allergy status to narcotic agent status: Secondary | ICD-10-CM | POA: Insufficient documentation

## 2020-02-04 DIAGNOSIS — Z888 Allergy status to other drugs, medicaments and biological substances status: Secondary | ICD-10-CM | POA: Diagnosis not present

## 2020-02-04 DIAGNOSIS — N202 Calculus of kidney with calculus of ureter: Principal | ICD-10-CM | POA: Insufficient documentation

## 2020-02-04 DIAGNOSIS — N390 Urinary tract infection, site not specified: Secondary | ICD-10-CM | POA: Insufficient documentation

## 2020-02-04 DIAGNOSIS — Z8673 Personal history of transient ischemic attack (TIA), and cerebral infarction without residual deficits: Secondary | ICD-10-CM | POA: Insufficient documentation

## 2020-02-04 DIAGNOSIS — M1990 Unspecified osteoarthritis, unspecified site: Secondary | ICD-10-CM | POA: Insufficient documentation

## 2020-02-04 DIAGNOSIS — Z8249 Family history of ischemic heart disease and other diseases of the circulatory system: Secondary | ICD-10-CM | POA: Insufficient documentation

## 2020-02-04 DIAGNOSIS — Z87891 Personal history of nicotine dependence: Secondary | ICD-10-CM | POA: Insufficient documentation

## 2020-02-04 HISTORY — PX: CYSTOSCOPY W/ URETERAL STENT PLACEMENT: SHX1429

## 2020-02-04 LAB — SARS CORONAVIRUS 2 BY RT PCR (HOSPITAL ORDER, PERFORMED IN ~~LOC~~ HOSPITAL LAB): SARS Coronavirus 2: NEGATIVE

## 2020-02-04 SURGERY — CYSTOSCOPY, WITH RETROGRADE PYELOGRAM AND URETERAL STENT INSERTION
Anesthesia: General | Site: Ureter | Laterality: Left

## 2020-02-04 MED ORDER — SODIUM CHLORIDE 0.9 % IR SOLN
Status: DC | PRN
  Administered 2020-02-04: 3000 mL

## 2020-02-04 MED ORDER — CLOPIDOGREL BISULFATE 75 MG PO TABS: 75.0000 mg | ORAL_TABLET | Freq: Every day | ORAL | Status: DC

## 2020-02-04 MED ORDER — ACETAMINOPHEN 325 MG PO TABS: 650.0000 mg | ORAL_TABLET | ORAL | Status: DC | PRN

## 2020-02-04 MED ORDER — SODIUM CHLORIDE 0.9 % IV SOLN
INTRAVENOUS | Status: AC
  Filled 2020-02-04: qty 10

## 2020-02-04 MED ORDER — ONDANSETRON HCL 4 MG/2ML IJ SOLN
4.0000 mg | INTRAMUSCULAR | Status: DC | PRN
  Administered 2020-02-04: 4 mg via INTRAVENOUS

## 2020-02-04 MED ORDER — DEXAMETHASONE SODIUM PHOSPHATE 10 MG/ML IJ SOLN
INTRAMUSCULAR | Status: AC
  Filled 2020-02-04: qty 1

## 2020-02-04 MED ORDER — BISACODYL 10 MG RE SUPP: 10.0000 mg | Freq: Every day | RECTAL | Status: DC | PRN

## 2020-02-04 MED ORDER — EPHEDRINE SULFATE-NACL 50-0.9 MG/10ML-% IV SOSY
PREFILLED_SYRINGE | INTRAVENOUS | Status: DC | PRN
  Administered 2020-02-04: 7.5 mg via INTRAVENOUS

## 2020-02-04 MED ORDER — SODIUM CHLORIDE 0.9 % IV SOLN
1.0000 g | Freq: Every day | INTRAVENOUS | Status: DC
  Administered 2020-02-04: 1 g via INTRAVENOUS
  Filled 2020-02-04: qty 1

## 2020-02-04 MED ORDER — HYDROCODONE-ACETAMINOPHEN 5-325 MG PO TABS: 1.0000 | ORAL_TABLET | ORAL | Status: DC | PRN

## 2020-02-04 MED ORDER — KCL IN DEXTROSE-NACL 20-5-0.45 MEQ/L-%-% IV SOLN
INTRAVENOUS | Status: DC
  Filled 2020-02-04 (×2): qty 1000

## 2020-02-04 MED ORDER — SODIUM CHLORIDE 0.9 % IV SOLN: 2.0000 g | Freq: Once | INTRAVENOUS | Status: DC

## 2020-02-04 MED ORDER — HYOSCYAMINE SULFATE 0.125 MG SL SUBL
0.1250 mg | SUBLINGUAL_TABLET | SUBLINGUAL | Status: DC | PRN
  Filled 2020-02-04: qty 1

## 2020-02-04 MED ORDER — HYDROMORPHONE HCL 1 MG/ML IJ SOLN
0.2500 mg | INTRAMUSCULAR | Status: DC | PRN
  Administered 2020-02-04 (×4): 0.5 mg via INTRAVENOUS

## 2020-02-04 MED ORDER — ALBUTEROL SULFATE (2.5 MG/3ML) 0.083% IN NEBU: 2.5000 mg | INHALATION_SOLUTION | RESPIRATORY_TRACT | Status: DC | PRN

## 2020-02-04 MED ORDER — EPHEDRINE 5 MG/ML INJ
INTRAVENOUS | Status: AC
  Filled 2020-02-04: qty 10

## 2020-02-04 MED ORDER — ZOLPIDEM TARTRATE 5 MG PO TABS: 5.0000 mg | ORAL_TABLET | Freq: Every evening | ORAL | Status: DC | PRN

## 2020-02-04 MED ORDER — LACTATED RINGERS IV SOLN
INTRAVENOUS | Status: DC | PRN
Start: 2020-02-04 — End: 2020-02-04

## 2020-02-04 MED ORDER — PROPOFOL 10 MG/ML IV BOLUS
INTRAVENOUS | Status: DC | PRN
  Administered 2020-02-04: 150 mg via INTRAVENOUS

## 2020-02-04 MED ORDER — BACLOFEN 10 MG PO TABS: 10.0000 mg | ORAL_TABLET | Freq: Every day | ORAL | Status: DC | PRN

## 2020-02-04 MED ORDER — MEPERIDINE HCL 50 MG/ML IJ SOLN: 6.2500 mg | INTRAMUSCULAR | Status: DC | PRN

## 2020-02-04 MED ORDER — ATORVASTATIN CALCIUM 40 MG PO TABS: 40.0000 mg | ORAL_TABLET | Freq: Every day | ORAL | Status: DC

## 2020-02-04 MED ORDER — LIDOCAINE 2% (20 MG/ML) 5 ML SYRINGE
INTRAMUSCULAR | Status: DC | PRN
  Administered 2020-02-04: 100 mg via INTRAVENOUS

## 2020-02-04 MED ORDER — KCL IN DEXTROSE-NACL 20-5-0.45 MEQ/L-%-% IV SOLN
INTRAVENOUS | Status: AC
  Filled 2020-02-04: qty 1000

## 2020-02-04 MED ORDER — SENNOSIDES-DOCUSATE SODIUM 8.6-50 MG PO TABS: 1.0000 | ORAL_TABLET | Freq: Every evening | ORAL | Status: DC | PRN

## 2020-02-04 MED ORDER — MIDAZOLAM HCL 2 MG/2ML IJ SOLN: 0.5000 mg | Freq: Once | INTRAMUSCULAR | Status: DC | PRN

## 2020-02-04 MED ORDER — IOHEXOL 300 MG/ML  SOLN
INTRAMUSCULAR | Status: DC | PRN
  Administered 2020-02-04: 5 mL via URETHRAL

## 2020-02-04 MED ORDER — TRAZODONE HCL 100 MG PO TABS
100.0000 mg | ORAL_TABLET | Freq: Every day | ORAL | Status: DC
  Administered 2020-02-04: 23:00:00 100 mg via ORAL
  Filled 2020-02-04: qty 1

## 2020-02-04 MED ORDER — SERTRALINE HCL 100 MG PO TABS: 100.0000 mg | ORAL_TABLET | Freq: Every day | ORAL | Status: DC

## 2020-02-04 MED ORDER — HYDROMORPHONE HCL 1 MG/ML IJ SOLN
0.5000 mg | INTRAMUSCULAR | Status: DC | PRN
  Administered 2020-02-05: 0.5 mg via INTRAVENOUS
  Filled 2020-02-04: qty 1

## 2020-02-04 MED ORDER — ONDANSETRON HCL 4 MG/2ML IJ SOLN
INTRAMUSCULAR | Status: AC
  Filled 2020-02-04: qty 2

## 2020-02-04 MED ORDER — ACETAMINOPHEN 325 MG PO TABS
650.0000 mg | ORAL_TABLET | ORAL | Status: DC
  Administered 2020-02-04 – 2020-02-05 (×3): 650 mg via ORAL
  Filled 2020-02-04 (×3): qty 2

## 2020-02-04 MED ORDER — MONTELUKAST SODIUM 10 MG PO TABS: 10.0000 mg | ORAL_TABLET | Freq: Every day | ORAL | Status: DC

## 2020-02-04 MED ORDER — FLUTICASONE PROPIONATE 50 MCG/ACT NA SUSP
2.0000 | Freq: Every day | NASAL | Status: DC
  Filled 2020-02-04: qty 16

## 2020-02-04 MED ORDER — LIDOCAINE 2% (20 MG/ML) 5 ML SYRINGE
INTRAMUSCULAR | Status: AC
  Filled 2020-02-04: qty 5

## 2020-02-04 MED ORDER — HYDROMORPHONE HCL 1 MG/ML IJ SOLN
INTRAMUSCULAR | Status: AC
  Filled 2020-02-04: qty 1

## 2020-02-04 MED ORDER — FLEET ENEMA 7-19 GM/118ML RE ENEM: 1.0000 | ENEMA | Freq: Once | RECTAL | Status: DC | PRN

## 2020-02-04 MED ORDER — PROMETHAZINE HCL 25 MG/ML IJ SOLN: 6.2500 mg | INTRAMUSCULAR | Status: DC | PRN

## 2020-02-04 MED ORDER — DEXAMETHASONE SODIUM PHOSPHATE 4 MG/ML IJ SOLN
INTRAMUSCULAR | Status: DC | PRN
  Administered 2020-02-04: 5 mg via INTRAVENOUS

## 2020-02-04 MED ORDER — 0.9 % SODIUM CHLORIDE (POUR BTL) OPTIME
TOPICAL | Status: DC | PRN
  Administered 2020-02-04: 1000 mL

## 2020-02-04 MED ORDER — ALBUTEROL SULFATE HFA 108 (90 BASE) MCG/ACT IN AERS: 2.0000 | INHALATION_SPRAY | RESPIRATORY_TRACT | Status: DC | PRN

## 2020-02-04 MED ORDER — FENTANYL CITRATE (PF) 100 MCG/2ML IJ SOLN
INTRAMUSCULAR | Status: DC | PRN
  Administered 2020-02-04 (×2): 50 ug via INTRAVENOUS

## 2020-02-04 MED ORDER — PROPOFOL 10 MG/ML IV BOLUS
INTRAVENOUS | Status: AC
  Filled 2020-02-04: qty 20

## 2020-02-04 MED ORDER — FENTANYL CITRATE (PF) 100 MCG/2ML IJ SOLN
INTRAMUSCULAR | Status: AC
  Filled 2020-02-04: qty 2

## 2020-02-04 SURGICAL SUPPLY — 17 items
BAG URO CATCHER STRL LF (MISCELLANEOUS) ×2 IMPLANT
CATH URET 5FR 28IN OPEN ENDED (CATHETERS) ×1 IMPLANT
CLOTH BEACON ORANGE TIMEOUT ST (SAFETY) ×2 IMPLANT
GLOVE BIOGEL PI IND STRL 7.5 (GLOVE) IMPLANT
GLOVE BIOGEL PI IND STRL 8 (GLOVE) IMPLANT
GLOVE BIOGEL PI INDICATOR 7.5 (GLOVE) ×1
GLOVE BIOGEL PI INDICATOR 8 (GLOVE) ×1
GLOVE SURG SS PI 8.0 STRL IVOR (GLOVE) ×1 IMPLANT
GOWN STRL REUS W/ TWL XL LVL3 (GOWN DISPOSABLE) IMPLANT
GOWN STRL REUS W/TWL XL LVL3 (GOWN DISPOSABLE) ×3 IMPLANT
GUIDEWIRE STR DUAL SENSOR (WIRE) ×2 IMPLANT
KIT TURNOVER KIT A (KITS) ×1 IMPLANT
MANIFOLD NEPTUNE II (INSTRUMENTS) ×2 IMPLANT
STENT URET 6FRX24 CONTOUR (STENTS) ×1 IMPLANT
TRAY CYSTO PACK (CUSTOM PROCEDURE TRAY) ×2 IMPLANT
TUBING CONNECTING 10 (TUBING) ×2 IMPLANT
TUBING UROLOGY SET (TUBING) ×1 IMPLANT

## 2020-02-04 NOTE — Transfer of Care (Signed)
Immediate Anesthesia Transfer of Care Note  Patient: Ryan Nicholson  Procedure(s) Performed: CYSTOSCOPY WITH RETROGRADE PYELOGRAM/URETERAL STENT PLACEMENT (Left Ureter)  Patient Location: PACU  Anesthesia Type:General  Level of Consciousness: awake, alert , oriented and patient cooperative  Airway & Oxygen Therapy: Patient Spontanous Breathing and Patient connected to face mask  Post-op Assessment: Report given to RN and Post -op Vital signs reviewed and stable  Post vital signs: Reviewed and stable  Last Vitals:  Vitals Value Taken Time  BP 120/68 02/04/20 2202  Temp    Pulse    Resp 10 02/04/20 2202  SpO2    Vitals shown include unvalidated device data.  Last Pain:  Vitals:   02/04/20 1912  TempSrc: Oral  PainSc: 4          Complications: No apparent anesthesia complications

## 2020-02-04 NOTE — Progress Notes (Signed)
Patient arrived to unit, vital signs assessed. See flow sheet. Pt is A&Ox4, ambulatory without assistance.

## 2020-02-04 NOTE — Plan of Care (Signed)

## 2020-02-04 NOTE — H&P (Signed)
Subjective: CC: Left flank pain and fever   Ryan Nicholson was seen in the office today with progressive severe left flank pain with a fever to 102 and an infected appearing urine.  His fever has abated with tylenol.  He still has pain.  He had a CT that showed a 46mm left ureteral stone in the proximal ureter.   He has elected to undergo stenting this evening.   ROS:  Review of Systems  Constitutional: Positive for fever.  Respiratory: Negative for shortness of breath.   Cardiovascular: Negative for chest pain.  Gastrointestinal: Positive for nausea and vomiting.  Genitourinary: Positive for dysuria and flank pain.    Allergies  Allergen Reactions  . Oxycontin [Oxycodone] Other (See Comments)    Unknown  . Reglan [Metoclopramide] Nausea Only    shakey    Past Medical History:  Diagnosis Date  . B12 deficiency   . Depression   . Dizziness   . DJD (degenerative joint disease)   . Gastric ulcer    hx of  . GERD (gastroesophageal reflux disease)   . History of kidney stones   . Hypercholesteremia   . Migraine   . Pneumonia   . Post herpetic neuralgia   . Stroke (HCC)    mild 2020 (weak right side), short term memory loss    Past Surgical History:  Procedure Laterality Date  . BACK SURGERY  09/1999, 01/2001  . CHOLECYSTECTOMY  2005  . COLON SURGERY    . EYE SURGERY Right    cataract removal  . GASTRIC BYPASS  03/2004  . HEMORRHOID SURGERY  2010  . HERNIA REPAIR    . KNEE SURGERY  1980, 1985, 2015  . left shoulder surgery  2007  . LUNG SURGERY Left 2002  . PROSTATE SURGERY    . SPINAL CORD STIMULATOR REMOVAL N/A 01/02/2020   Procedure: Spinal cord stimulator removal  Lumbar;  Surgeon: Tia Alert, MD;  [facility]: [facility];  Service: Neurosurgery;  Laterality: N/A;  . SPINE SURGERY     spinal stimulator  . TONSILECTOMY/ADENOIDECTOMY WITH MYRINGOTOMY    . TONSILLECTOMY      Social History   Socioeconomic History  . Marital status: Married    Spouse name: Mary  .  Number of children: Not on file  . Years of education: Not on file  . Highest education level: Not on file  Occupational History  . Not on file  Tobacco Use  . Smoking status: Former Smoker    Quit date: 02/14/2001    Years since quitting: 18.9  . Smokeless tobacco: Never Used  Substance and Sexual Activity  . Alcohol use: No  . Drug use: No  . Sexual activity: Not on file  Other Topics Concern  . Not on file  Social History Narrative   Lives with wife   Social Determinants of Health   Financial Resource Strain:   . Difficulty of Paying Living Expenses:   Food Insecurity:   . Worried About Programme researcher, broadcasting/film/video in the Last Year:   . Barista in the Last Year:   Transportation Needs:   . Freight forwarder (Medical):   Marland Kitchen Lack of Transportation (Non-Medical):   Physical Activity:   . Days of Exercise per Week:   . Minutes of Exercise per Session:   Stress:   . Feeling of Stress :   Social Connections:   . Frequency of Communication with Friends and Family:   . Frequency of Social Gatherings  with Friends and Family:   . Attends Religious Services:   . Active Member of Clubs or Organizations:   . Attends Archivist Meetings:   Marland Kitchen Marital Status:   Intimate Partner Violence:   . Fear of Current or Ex-Partner:   . Emotionally Abused:   Marland Kitchen Physically Abused:   . Sexually Abused:     Family History  Problem Relation Age of Onset  . Breast cancer Mother   . Diabetes Father   . Hypertension Father   . Stroke Father   . Parkinson's disease Father   . Brain cancer Maternal Uncle     Anti-infectives: Anti-infectives (From admission, onward)   Start     Dose/Rate Route Frequency Ordered Stop   02/04/20 1830  cefTRIAXone (ROCEPHIN) 1 g in sodium chloride 0.9 % 100 mL IVPB     1 g 200 mL/hr over 30 Minutes Intravenous Every 24 hours 02/04/20 1816     02/04/20 1715  cefTRIAXone (ROCEPHIN) 2 g in sodium chloride 0.9 % 100 mL IVPB     2 g 200 mL/hr over  30 Minutes Intravenous  Once 02/04/20 1708        Current Facility-Administered Medications  Medication Dose Route Frequency Provider Last Rate Last Admin  . acetaminophen (TYLENOL) tablet 650 mg  650 mg Oral Q4H PRN Irine Seal, MD      . bisacodyl (DULCOLAX) suppository 10 mg  10 mg Rectal Daily PRN Irine Seal, MD      . cefTRIAXone (ROCEPHIN) 1 g in sodium chloride 0.9 % 100 mL IVPB  1 g Intravenous Q24H Irine Seal, MD      . cefTRIAXone (ROCEPHIN) 2 g in sodium chloride 0.9 % 100 mL IVPB  2 g Intravenous Once Irine Seal, MD      . dextrose 5 % and 0.45 % NaCl with KCl 20 mEq/L infusion   Intravenous Continuous Irine Seal, MD      . HYDROcodone-acetaminophen (NORCO/VICODIN) 5-325 MG per tablet 1-2 tablet  1-2 tablet Oral Q4H PRN Irine Seal, MD      . HYDROmorphone (DILAUDID) injection 0.5-1 mg  0.5-1 mg Intravenous Q2H PRN Irine Seal, MD      . hyoscyamine (LEVSIN SL) SL tablet 0.125 mg  0.125 mg Sublingual Q4H PRN Irine Seal, MD      . ondansetron [facility]) injection 4 mg  4 mg Intravenous Q4H PRN Irine Seal, MD      . senna-docusate (Senokot-S) tablet 1 tablet  1 tablet Oral QHS PRN Irine Seal, MD      . sodium phosphate (FLEET) 7-19 GM/118ML enema 1 enema  1 enema Rectal Once PRN Irine Seal, MD      . zolpidem (AMBIEN) tablet 5 mg  5 mg Oral QHS PRN,MR X 1 Irine Seal, MD         Objective: Vital signs in last 24 hours: BP 101/63 (BP Location: Right Arm)   Pulse 62   Temp 98.2 F (36.8 C) (Oral)   Resp 18   SpO2 100%   Intake/Output from previous day: No intake/output data recorded. Intake/Output this shift: No intake/output data recorded.   Physical Exam Vitals reviewed.  Constitutional:      Appearance: Normal appearance.  Cardiovascular:     Rate and Rhythm: Normal rate and regular rhythm.     Heart sounds: Normal heart sounds.  Pulmonary:     Effort: Pulmonary effort is normal. No respiratory distress.     Breath sounds: Normal breath sounds.  Abdominal:     General: Abdomen is flat.     Palpations: Abdomen is soft.     Tenderness: There is abdominal tenderness (LLQ).  Musculoskeletal:        General: No swelling or tenderness. Normal range of motion.  Skin:    General: Skin is warm and dry.  Neurological:     General: No focal deficit present.     Mental Status: He is alert and oriented to person, place, and time.     Lab Results:  Results for orders placed or performed during the hospital encounter of 02/04/20 (from the past 24 hour(s))  SARS Coronavirus 2 by RT PCR (hospital order, performed in [facility] hospital lab) Nasopharyngeal Nasopharyngeal Swab     Status: None   Collection Time: 02/04/20  5:10 PM   Specimen: Nasopharyngeal Swab  Result Value Ref Range   SARS Coronavirus 2 NEGATIVE NEGATIVE    BMET No results for input(s): NA, K, CL, CO2, GLUCOSE, BUN, CREATININE, CALCIUM in the last 72 hours. PT/INR No results for input(s): LABPROT, INR in the last 72 hours. ABG No results for input(s): PHART, HCO3 in the last 72 hours.  Invalid input(s): PCO2, PO2  Studies/Results: No results found.   Assessment/Plan: Left ureteral stone with history of fever.  He will be taking for cystoscopy and stent placement today.  Risks of bleeding, infection, ureteral injury, need for secondary procedures, thrombotic events and anesthetic complications reviewed.   Meds ordered this encounter  Medications  . cefTRIAXone (ROCEPHIN) 2 g in sodium chloride 0.9 % 100 mL IVPB    Order Specific Question:   Indication:    Answer:   Surgical Prophylaxis  . dextrose 5 % and 0.45 % NaCl with KCl 20 mEq/L infusion  . cefTRIAXone (ROCEPHIN) 1 g in sodium chloride 0.9 % 100 mL IVPB    Order Specific Question:   Antibiotic Indication:    Answer:   UTI  . acetaminophen (TYLENOL) tablet 650 mg  . HYDROcodone-acetaminophen (NORCO/VICODIN) 5-325 MG per tablet 1-2 tablet  . HYDROmorphone (DILAUDID) injection 0.5-1 mg  . hyoscyamine  (LEVSIN SL) SL tablet 0.125 mg  . zolpidem (AMBIEN) tablet 5 mg  . senna-docusate (Senokot-S) tablet 1 tablet  . bisacodyl (DULCOLAX) suppository 10 mg  . sodium phosphate (FLEET) 7-19 GM/118ML enema 1 enema  . ondansetron (ZOFRAN) injection 4 mg        No follow-ups on file.    CC: [facility].       Bjorn Pippin 02/04/2020 [phone]

## 2020-02-04 NOTE — Op Note (Addendum)
PATIENT:  Ryan Nicholson  Preoperative diagnosis:  1. Left obstructing ureteral stone   Postoperative diagnosis:  1. Left obstructing ureteral stone   Procedure:  1. Cystoscopy 2. Left ureteral stent placement (6Fr x 24cm) 3. Left retrograde pyelography with interpretation  Surgeon: Dr. Bjorn Pippin, M.D.  Assistant: Dr. Lars Pinks, MD  Anesthesia: General  Complications: None  EBL: Minimal  Specimens: Left renal pelvis urine for culture.   Indication: Ryan Nicholson is a 59 y.o. male with history stoke on Plavix, migraines on topamax, BPH s/p Urolift, nephrolithiasis who presented to the office with fever and flank pain due to an obstructing left proximal ureteral stone. After reviewing the management options for treatment, they have elected to proceed with the above surgical procedure(s). We have discussed the potential benefits and risks of the procedure, side effects of the proposed treatment, the likelihood of the patient achieving the goals of the procedure, and any potential problems that might occur during the procedure or recuperation. Informed consent has been obtained.   Findings:  Urethral meatus required serial dilation Relatively open anterior prostatic channel, no Urolift tines or tabs visible Cystoscopy without mass/tumors/stones. Orthotopic ureteral offices Scout fluoroscopy with dense proximal ureteral stone Slight resistance with passage of 5Fr open ended stent. Brisk hydronephrotic drip, some debris but not frank pus Left retrograde pyelogram with 5cc of Omnipaque with moderate hydronephrosis Uncomplicated left 6Fr x 24cm JJ ureteral stent placement without return of frank pus  Description of procedure:   The patient was taken to the operating room and general anesthesia was induced.  The patient was placed in the dorsal lithotomy position, prepped and draped in the usual sterile fashion, and preoperative antibiotics were administered. A preoperative time-out  was performed.   Cystourethroscopy was performed.  The patient's urethra was examined and finding were noted as above. The bladder was then systematically examined in its entirety. There was no evidence for any bladder tumors, stones, or other mucosal pathology.    Attention then turned to the left ureteral orifice and a ureteral catheter was used to intubate the ureteral orifice.  Omnipaque contrast was injected through the ureteral catheter and a retrograde pyelogram which revealed the above findings.  A Sensor guidewire was then advanced up the left ureter into the renal pelvis under fluoroscopic guidance. The open ended was advanced to the renal pelvis and the wire was removed. There was a brisk hydronephrotic drip with some debris but not pus. This was sent for culture. The system was allowed to decompress. The wire was then replaced and the open ended catheter was removed. A ureteral stent was advance over the wire using Seldinger technique.  The stent was positioned appropriately under fluoroscopic and cystoscopic guidance.  The wire was then removed with an adequate stent curl noted in the renal pelvis as well as in the bladder.  The bladder was then emptied and the procedure ended.  The patient appeared to tolerate the procedure well and without complications.  The patient was able to be awakened and transferred to the recovery unit in satisfactory condition.

## 2020-02-04 NOTE — Anesthesia Procedure Notes (Signed)
Procedure Name: LMA Insertion Date/Time: 02/04/2020 9:35 PM Performed by: Vanessa [place], CRNA Pre-anesthesia Checklist: Emergency Drugs available, Patient identified, Suction available and Patient being monitored Patient Re-evaluated:Patient Re-evaluated prior to induction Oxygen Delivery Method: Circle system utilized Preoxygenation: Pre-oxygenation with 100% oxygen Induction Type: IV induction Ventilation: Mask ventilation without difficulty LMA: LMA inserted LMA Size: 4.0 Number of attempts: 1 Placement Confirmation: positive ETCO2 and breath sounds checked- equal and bilateral Tube secured with: Tape Dental Injury: Teeth and Oropharynx as per pre-operative assessment

## 2020-02-04 NOTE — Progress Notes (Signed)
OR staff transported patient to surgery suite.

## 2020-02-04 NOTE — Anesthesia Postprocedure Evaluation (Signed)
Anesthesia Post Note  Patient: Ryan Nicholson  Procedure(s) Performed: CYSTOSCOPY WITH RETROGRADE PYELOGRAM/URETERAL STENT PLACEMENT (Left Ureter)     Patient location during evaluation: PACU Anesthesia Type: General Level of consciousness: awake and alert, oriented and patient cooperative Pain management: pain level controlled (pain improving) Vital Signs Assessment: post-procedure vital signs reviewed and stable Respiratory status: spontaneous breathing, nonlabored ventilation and respiratory function stable Cardiovascular status: blood pressure returned to baseline and stable Postop Assessment: no apparent nausea or vomiting Anesthetic complications: no    Last Vitals:  Vitals:   02/04/20 2230 02/04/20 2235  BP: 113/69   Pulse: 65 62  Resp: 12 13  Temp:  (P) 36.7 C  SpO2: 100% 100%    Last Pain:  Vitals:   02/04/20 2220  TempSrc:   PainSc: 6                  Macon Sandiford,E. Jacinto Keil

## 2020-02-04 NOTE — Anesthesia Preprocedure Evaluation (Addendum)
Anesthesia Evaluation  Patient identified by MRN, date of birth, ID band Patient awake    Reviewed: Allergy & Precautions, NPO status , Patient's Chart, lab work & pertinent test results  History of Anesthesia Complications Negative for: history of anesthetic complications  Airway Mallampati: II  TM Distance: >3 FB Neck ROM: Full    Dental  (+) Poor Dentition, Chipped, Missing, Dental Advisory Given   Pulmonary sleep apnea (does not se CPAP) , COPD,  COPD inhaler, former smoker,  02/04/2020 SARS coronavirus NEG   breath sounds clear to auscultation       Cardiovascular negative cardio ROS   Rhythm:Regular Rate:Normal  '20 ECHO: EF 60-65%, mld AI   Neuro/Psych  Headaches, Anxiety Depression TIACVA (R sided weakness), Residual Symptoms    GI/Hepatic Neg liver ROS, GERD  Controlled,S/p gastric bypass   Endo/Other  negative endocrine ROS  Renal/GU stones     Musculoskeletal   Abdominal   Peds  Hematology negative hematology ROS (+)   Anesthesia Other Findings   Reproductive/Obstetrics                            Anesthesia Physical Anesthesia Plan  ASA: II  Anesthesia Plan: General   Post-op Pain Management:    Induction: Intravenous  PONV Risk Score and Plan: 2 and Ondansetron and Dexamethasone  Airway Management Planned: LMA  Additional Equipment:   Intra-op Plan:   Post-operative Plan:   Informed Consent: I have reviewed the patients History and Physical, chart, labs and discussed the procedure including the risks, benefits and alternatives for the proposed anesthesia with the patient or authorized representative who has indicated his/her understanding and acceptance.     Dental advisory given  Plan Discussed with: CRNA and Surgeon  Anesthesia Plan Comments:         Anesthesia Quick Evaluation

## 2020-02-05 DIAGNOSIS — N390 Urinary tract infection, site not specified: Secondary | ICD-10-CM | POA: Diagnosis present

## 2020-02-05 DIAGNOSIS — N202 Calculus of kidney with calculus of ureter: Secondary | ICD-10-CM | POA: Diagnosis not present

## 2020-02-05 LAB — CBC
HCT: 35 % — ABNORMAL LOW (ref 39.0–52.0)
Hemoglobin: 11.2 g/dL — ABNORMAL LOW (ref 13.0–17.0)
MCH: 29.5 pg (ref 26.0–34.0)
MCHC: 32 g/dL (ref 30.0–36.0)
MCV: 92.1 fL (ref 80.0–100.0)
Platelets: 106 10*3/uL — ABNORMAL LOW (ref 150–400)
RBC: 3.8 MIL/uL — ABNORMAL LOW (ref 4.22–5.81)
RDW: 14.2 % (ref 11.5–15.5)
WBC: 5.1 10*3/uL (ref 4.0–10.5)
nRBC: 0 % (ref 0.0–0.2)

## 2020-02-05 LAB — BASIC METABOLIC PANEL
Anion gap: 7 (ref 5–15)
BUN: 19 mg/dL (ref 6–20)
CO2: 22 mmol/L (ref 22–32)
Calcium: 8.7 mg/dL — ABNORMAL LOW (ref 8.9–10.3)
Chloride: 109 mmol/L (ref 98–111)
Creatinine, Ser: 1.03 mg/dL (ref 0.61–1.24)
GFR calc Af Amer: >60 mL/min (ref >60)
GFR calc non Af Amer: >60 mL/min (ref >60)
Glucose, Bld: 228 mg/dL — ABNORMAL HIGH (ref 70–99)
Potassium: 4.8 mmol/L (ref 3.5–5.1)
Sodium: 138 mmol/L (ref 135–145)

## 2020-02-05 LAB — URINE CULTURE: Culture: NO GROWTH

## 2020-02-05 MED ORDER — HYOSCYAMINE SULFATE SL 0.125 MG SL SUBL
0.1250 mg | SUBLINGUAL_TABLET | Freq: Four times a day (QID) | SUBLINGUAL | 1 refills | Status: DC | PRN
Start: 2020-02-05 — End: 2022-04-29

## 2020-02-05 MED ORDER — CEFDINIR 300 MG PO CAPS: 300.0000 mg | ORAL_CAPSULE | Freq: Two times a day (BID) | ORAL | 0 refills | Status: DC

## 2020-02-05 NOTE — Plan of Care (Signed)

## 2020-02-05 NOTE — Discharge Summary (Signed)
Physician Discharge Summary  Patient ID: Ryan Nicholson MRN: [id number] DOB/AGE: [birth year] 59 y.o.  Admit date: 02/04/2020 Discharge date: 02/05/2020  Admission Diagnoses:  Left ureteral stone  Discharge Diagnoses:  Principal Problem:   Left ureteral stone Active Problems:   UTI (urinary tract infection)   Past Medical History:  Diagnosis Date   B12 deficiency    Depression    Dizziness    DJD (degenerative joint disease)    Gastric ulcer    hx of   GERD (gastroesophageal reflux disease)    History of kidney stones    Hypercholesteremia    Migraine    Pneumonia    Post herpetic neuralgia    Stroke (HCC)    mild 2020 (weak right side), short term memory loss    Surgeries: Procedure(s): CYSTOSCOPY WITH RETROGRADE PYELOGRAM/URETERAL STENT PLACEMENT on 02/04/2020   Consultants (if any):   Discharged Condition: Improved  Hospital Course: Ryan Nicholson is an 59 y.o. male who was admitted 02/04/2020 with a diagnosis of Left ureteral stone and went to the operating room on 02/04/2020 and underwent the above named procedures.  He did well and remained afebrile with improved pain.   He was given perioperative antibiotics:  Anti-infectives (From admission, onward)    Start     Dose/Rate Route Frequency Ordered Stop   02/05/20 0000  cefdinir (OMNICEF) 300 MG capsule     300 mg Oral 2 times daily 02/05/20 0734     02/04/20 2048  sodium chloride 0.9 % with cefTRIAXone (ROCEPHIN) ADS Med    Note to Pharmacy: Nadene Rubins   : cabinet override      02/04/20 2048 02/04/20 2119   02/04/20 2000  cefTRIAXone (ROCEPHIN) 1 g in sodium chloride 0.9 % 100 mL IVPB     1 g 200 mL/hr over 30 Minutes Intravenous Daily-1800 02/04/20 1816     02/04/20 1715  cefTRIAXone (ROCEPHIN) 2 g in sodium chloride 0.9 % 100 mL IVPB  Status:  Discontinued     2 g 200 mL/hr over 30 Minutes Intravenous  Once 02/04/20 1708 02/04/20 1906     .  He was given sequential compression devices for DVT  prophylaxis.  He benefited maximally from the hospital stay and there were no complications.    Recent vital signs:  Vitals:   02/05/20 0245 02/05/20 0250  BP: (!) 115/91   Pulse: (!) 56   Resp: 16   Temp: (!) 97.4 F (36.3 C)   SpO2: 99% 100%    Recent laboratory studies:  Lab Results  Component Value Date   HGB 11.2 (L) 02/05/2020   HGB 12.9 (L) 12/25/2019   HGB 12.2 (L) 05/18/2019   Lab Results  Component Value Date   WBC 5.1 02/05/2020   PLT 106 (L) 02/05/2020   Lab Results  Component Value Date   INR 1.1 05/17/2019   Lab Results  Component Value Date   NA 138 02/05/2020   K 4.8 02/05/2020   CL 109 02/05/2020   CO2 22 02/05/2020   BUN 19 02/05/2020   CREATININE 1.03 02/05/2020   GLUCOSE 228 (H) 02/05/2020    Discharge Medications:   Allergies as of 02/05/2020       Reactions   Oxycontin [oxycodone] Other (See Comments)   Unknown   Reglan [metoclopramide] Nausea Only   shakey        Medication List     TAKE these medications    albuterol 108 (90 Base) MCG/ACT inhaler Commonly known  as: VENTOLIN HFA Inhale 2 puffs into the lungs every 4 (four) hours as needed for wheezing or shortness of breath.   atorvastatin 40 MG tablet Commonly known as: LIPITOR Take 40 mg by mouth at bedtime.   baclofen 10 MG tablet Commonly known as: LIORESAL Take 10 mg by mouth daily as needed for muscle spasms.   cefdinir 300 MG capsule Commonly known as: OMNICEF Take 1 capsule (300 mg total) by mouth 2 (two) times daily.   clopidogrel 75 MG tablet Commonly known as: PLAVIX Take 75 mg by mouth daily.   cyanocobalamin 1000 MCG/ML injection Commonly known as: (VITAMIN B-12) Inject 1 mL (1,000 mcg total) into the muscle every 30 (thirty) days.   fluticasone 50 MCG/ACT nasal spray Commonly known as: FLONASE Place 2 sprays into both nostrils daily.   Hyoscyamine Sulfate SL 0.125 MG Subl Commonly known as: Levsin/SL Place 0.125 mg under the tongue every 6  (six) hours as needed.   ibuprofen 200 MG tablet Commonly known as: ADVIL Take 400 mg by mouth every 6 (six) hours as needed.   meloxicam 15 MG tablet Commonly known as: MOBIC Take 15 mg by mouth daily as needed for pain.   montelukast 10 MG tablet Commonly known as: SINGULAIR Take 10 mg by mouth daily.   Nucynta 100 MG Tabs Generic drug: Tapentadol HCl Take 1 tablet (100 mg total) by mouth in the morning, at noon, in the evening, and at bedtime.   Nucynta 75 MG tablet Generic drug: tapentadol HCl Take 75 mg by mouth every 6 (six) hours as needed for moderate pain or severe pain.   ondansetron 8 MG tablet Commonly known as: ZOFRAN Take 1 tablet (8 mg total) by mouth every 8 (eight) hours as needed for nausea or vomiting.   sertraline 100 MG tablet Commonly known as: ZOLOFT Take 100 mg by mouth daily.   testosterone cypionate 200 MG/ML injection Commonly known as: DEPOTESTOSTERONE CYPIONATE Inject 200 mg into the muscle every 30 (thirty) days.   topiramate 100 MG tablet Commonly known as: TOPAMAX Take 100 mg by mouth at bedtime.   traZODone 100 MG tablet Commonly known as: DESYREL Take 100 mg by mouth at bedtime.   Ubrelvy 100 MG Tabs Generic drug: Ubrogepant Take 1 tablet by mouth as directed. Take 1 tablet (100 mg) up to BID A WEEK FOR MIGRAINE        Diagnostic Studies: DG C-Arm 1-60 Min-No Report  Result Date: 02/04/2020 Fluoroscopy was utilized by the requesting physician.  No radiographic interpretation.     Disposition: Discharge disposition: 01-Home or Self Care      Discharge Instructions     Discharge patient   Complete by: As directed    Discharge disposition: 01-Home or Self Care   Discharge patient date: 02/05/2020   Discontinue IV   Complete by: As directed        Follow-up Information     Winter, Conception Oms, MD.   Specialty: Urology Why: The office will call to schedule next procedure.  Contact information: [address] [place] [address] [phone]             Signed: Irine Seal 02/05/2020, 11:26 AM

## 2020-02-05 NOTE — Discharge Instructions (Signed)

## 2020-02-06 ENCOUNTER — Telehealth: Payer: Self-pay

## 2020-02-06 ENCOUNTER — Telehealth: Payer: Self-pay | Admitting: Neurology

## 2020-02-06 NOTE — Telephone Encounter (Signed)
I called pt about Ct of cervical spine results. I stated the  CT scan of the cervical spine shows no significant spinal stenosis or compression. Mild wear-and-tear changes between the third and fourth vertebrae with slight narrowing of the foramina on either side. Nothing to worry about. PT verbalized understanding.

## 2020-02-06 NOTE — Telephone Encounter (Signed)
Pt called in regards to ct results

## 2020-02-06 NOTE — Telephone Encounter (Signed)
I call pt back about Ct scan of cervical spine results. Pt stated he recently had his spinal cord stimulator remove and was recovering from it. He will call back once he has healed to get MRI that was order last year by Dr.Sethi. PT was unable to do because he still had it. I stated Dr.SEthi will be made aware of it.

## 2020-02-07 NOTE — Telephone Encounter (Signed)
Okay agree

## 2020-02-12 ENCOUNTER — Other Ambulatory Visit: Payer: Self-pay | Admitting: Urology

## 2020-02-12 DIAGNOSIS — N201 Calculus of ureter: Secondary | ICD-10-CM

## 2020-02-13 ENCOUNTER — Ambulatory Visit: Payer: Medicare Other | Admitting: Adult Health

## 2020-02-14 ENCOUNTER — Ambulatory Visit: Payer: Medicare Other | Admitting: Adult Health

## 2020-02-25 NOTE — H&P (Signed)
CC/HPI: CC: Left ureteral stone   HPI: Ryan Nicholson is a 59 year old male with a history of chronic back pain, kidney stones (s/p left JJ stent on 02/04/20) and BPH (s/p Urolift in 2017 Dr. Celine Ahr)   The patient is here today for routine postop visit. He reports a "low-grade fever" around 6 PM on a nightly basis since being discharged from the hospital. He is currently on Omnicef though his urine culture from 02/04/2019 showed no growth. He is urinating without difficulty denies dysuria or hematuria. Currently afebrile and his vital signs are stable in the office today.   02/13/20: Ryan Nicholson has the above noted past medical history. he had stent placement on 02/04/20 for left ureteral stone. Today he presents with suprapubic pain and gross hematuria as well as frequency of every 30 minutes. he denies fevers, flank pain, nausea and vomiting. He currently takes Nucynta for pain but it is not helping the bladder spasms. He has ESWL scheduled for June. He denies dysuria. Currently taking cefdinir.     ALLERGIES: None   MEDICATIONS: Omeprazole  Tamsulosin Hcl 0.4 mg capsule 1 capsule PO Q HS  Tamsulosin Hcl 0.4 mg capsule 1 capsule PO Q HS  Acidophilus  Atorvastatin Calcium  Cyanocobalamin  Fluticasone Propionate  Multivital  Sertraline Hcl  Testosterone  Topiramate  Trazodone Hcl  Valacyclovir     GU PSH: Cystoscopy Insert Stent, Left - 02/04/2020 UROLIFT - about 2016       PSH Notes: Spine disectomy T10-11  T10 -11 fusion   NON-GU PSH: Gastric bypass - 2005 Knee Arthroscopy, Right - 1980 Remove Gallbladder - 2005 Tonsillectomy - 1994     GU PMH: Ureteral calculus, He has a left proximal stone with fever and pain and needs cystoscopy and stent insertion today. I have reviewed the risks of bleeding, infection, ureteral injury, need for secondary procedures, thrombotic events and anesthetic complications. - 02/04/2020 Gross hematuria - 01/24/2020 Renal calculus - 01/24/2020 BPH w/o LUTS  (Stable) - 2019    NON-GU PMH: Pyuria/other UA findings - 02/08/2020 Bacteriuria, His urine looks infected today. I a culture was sent. He will be kept over night on antibiotics after stent insertion. - 02/04/2020 Follicular cyst of the skin and subcutaneous tissue, unspecified - 2019    FAMILY HISTORY: None   SOCIAL HISTORY: Marital Status: Married Preferred Language: English; Ethnicity: Not Hispanic Or Latino; Race: White Current Smoking Status: Patient has never smoked.   Tobacco Use Assessment Completed: Used Tobacco in last 30 days? Does not use smokeless tobacco. Does not use drugs. Drinks 1 caffeinated drink per day. Has not had a blood transfusion.    REVIEW OF SYSTEMS:    GU Review Male:   Patient reports frequent urination. Patient denies hard to postpone urination, burning/ pain with urination, get up at night to urinate, leakage of urine, stream starts and stops, trouble starting your stream, have to strain to urinate , erection problems, and penile pain.  Gastrointestinal (Upper):   Patient reports nausea. Patient denies vomiting and indigestion/ heartburn.  Gastrointestinal (Lower):   Patient denies diarrhea and constipation.  Constitutional:   Patient reports fatigue. Patient denies fever, night sweats, and weight loss.  Skin:   Patient denies skin rash/ lesion and itching.  Eyes:   Patient denies blurred vision and double vision.  Ears/ Nose/ Throat:   Patient denies sore throat and sinus problems.  Hematologic/Lymphatic:   Patient denies swollen glands and easy bruising.  Cardiovascular:   Patient denies leg  swelling and chest pains.  Respiratory:   Patient denies cough and shortness of breath.  Endocrine:   Patient denies excessive thirst.  Musculoskeletal:   Patient reports back pain. Patient denies joint pain.  Neurological:   Patient denies headaches and dizziness.  Psychologic:   Patient denies depression and anxiety.   Notes: sharp throbbing abdominal pain     VITAL SIGNS:    Weight 151 lb / 68.49 kg  Height 67 in / 170.18 cm  BP 94/61 mmHg  Pulse 69 /min  Temperature 98.4 F / 36.8 C  BMI 23.6 kg/m   MULTI-SYSTEM PHYSICAL EXAMINATION:    Constitutional: Thin. No physical deformities. Normally developed. Good grooming.   Respiratory: No labored breathing, no use of accessory muscles.   Cardiovascular: Normal temperature, normal extremity pulses, no swelling, no varicosities.  Lymphatic: No enlargement of neck, axillae, groin.  Skin: No paleness, no jaundice, no cyanosis. No lesion, no ulcer, no rash.  Gastrointestinal: No mass, no tenderness, no rigidity, non obese abdomen.  Eyes: Normal conjunctivae. Normal eyelids.  Musculoskeletal: Normal gait and station of head and neck.     Complexity of Data:  Source Of History:  Patient, Medical Record Summary  Records Review:   Previous Doctor Records, Previous Hospital Records, Previous Patient Records  Urine Test Review:   Urinalysis, Urine Culture  X-Ray Review: Cystogram: Reviewed Films.         Urinalysis w/Scope Micro  WBC/hpf: 6 - 10/hpf  RBC/hpf: >60/hpf  Bacteria: Rare (0-9/hpf)  Cystals: NS (Not Seen)  Casts: NS (Not Seen)  Trichomonas: Not Present  Mucous: Not Present  Epithelial Cells: 0 - 5/hpf  Yeast: NS (Not Seen)  Sperm: Not Present         Ketoralac 30mg  - [id number], [id number] Injection site prepped with alcohol. 30 mg Ketoralac injected IM into the left buttock using standard Z-track technique. A band-aid was applied. Pt tolerated well without complications.    Qty: 30 Adm. By: Howell Pringle  Unit: mg Lot No [id number]  Route: IM Exp. Date 12/24/2020  Freq: None Mfgr.: [id number]  Site: Left Buttock   ASSESSMENT/PLAN:      ICD-10 Details  1 GU:   Ureteral calculus - N20.1 Left  -The risks, benefits and alternatives of LEFT ESWL was discussed with the patient. I described the risks which include arrhythmia, kidney contusion, kidney hemorrhage, need for  transfusion, back discomfort, flank ecchymosis, flank abrasion, inability to break up stone, inability to pass stone fragments, Steinstrasse, infection associated with obstructing stones, need for different surgical procedure and possible need for repeat shockwave lithotripsy. The patient voices understanding and wishes to proceed.   -continue omnicef.  -Hold plavix 5 days prior          Notes:   Urine culture was negative, pt is on Cefdinir. KUB is not concerning for STENT displacement. HIs frequency and bladder spasms are likely related to the stent. Toradol given for pain. RX sent to pharmacy for tamsulosin a 4 week prescription of myrbetriq handed to patient. Discussed side effects of Myrbetriq, should he be unable to urinate, develop a headache or any other concerns.

## 2020-02-26 NOTE — Progress Notes (Signed)
Spoke with patient.  He has stopped Plavix, Aspirin and all motrin/advil/ibuprofen.  We will complete pre op phone call on Thursday.  Patient aware.

## 2020-02-28 ENCOUNTER — Other Ambulatory Visit (HOSPITAL_COMMUNITY)
Admission: RE | Admit: 2020-02-28 | Discharge: 2020-02-28 | Disposition: A | Discharge status: Home / Self Care | Payer: Medicare Other | Source: Ambulatory Visit | Attending: Urology | Admitting: Urology

## 2020-02-28 DIAGNOSIS — Z01812 Encounter for preprocedural laboratory examination: Secondary | ICD-10-CM | POA: Diagnosis present

## 2020-02-28 DIAGNOSIS — Z20822 Contact with and (suspected) exposure to covid-19: Secondary | ICD-10-CM | POA: Diagnosis not present

## 2020-02-28 LAB — SARS CORONAVIRUS 2 (TAT 6-24 HRS): SARS Coronavirus 2: NEGATIVE

## 2020-02-29 NOTE — Progress Notes (Signed)
Went to [facility] for pt. Left instructions on voicemail. NPO after MN, Arrival time 0600. If any questions to call me back at [phone]

## 2020-03-03 ENCOUNTER — Encounter (HOSPITAL_BASED_OUTPATIENT_CLINIC_OR_DEPARTMENT_OTHER): Payer: Self-pay | Admitting: Urology

## 2020-03-03 ENCOUNTER — Encounter (HOSPITAL_BASED_OUTPATIENT_CLINIC_OR_DEPARTMENT_OTHER)
Admission: RE | Disposition: A | Discharge status: Home / Self Care | Payer: Self-pay | Source: Ambulatory Visit | Attending: Urology

## 2020-03-03 ENCOUNTER — Ambulatory Visit (HOSPITAL_COMMUNITY): Payer: Medicare Other

## 2020-03-03 ENCOUNTER — Ambulatory Visit (HOSPITAL_BASED_OUTPATIENT_CLINIC_OR_DEPARTMENT_OTHER)
Admission: RE | Admit: 2020-03-03 | Discharge: 2020-03-03 | Disposition: A | Discharge status: Home / Self Care | Payer: Medicare Other | Source: Ambulatory Visit | Attending: Urology | Admitting: Urology

## 2020-03-03 DIAGNOSIS — N201 Calculus of ureter: Secondary | ICD-10-CM

## 2020-03-03 DIAGNOSIS — Z8673 Personal history of transient ischemic attack (TIA), and cerebral infarction without residual deficits: Secondary | ICD-10-CM | POA: Insufficient documentation

## 2020-03-03 DIAGNOSIS — N400 Enlarged prostate without lower urinary tract symptoms: Secondary | ICD-10-CM | POA: Insufficient documentation

## 2020-03-03 DIAGNOSIS — Z87442 Personal history of urinary calculi: Secondary | ICD-10-CM | POA: Insufficient documentation

## 2020-03-03 DIAGNOSIS — Z888 Allergy status to other drugs, medicaments and biological substances status: Secondary | ICD-10-CM | POA: Insufficient documentation

## 2020-03-03 DIAGNOSIS — Z79899 Other long term (current) drug therapy: Secondary | ICD-10-CM | POA: Diagnosis not present

## 2020-03-03 DIAGNOSIS — Z7902 Long term (current) use of antithrombotics/antiplatelets: Secondary | ICD-10-CM | POA: Diagnosis not present

## 2020-03-03 HISTORY — PX: EXTRACORPOREAL SHOCK WAVE LITHOTRIPSY: SHX1557

## 2020-03-03 IMAGING — DX DG ABDOMEN 1V
2 series · 2 of 2 positions shown · non-contrast
Comparison: Abdominal radiograph-[DATE];

CLINICAL DATA: Preoperative examination for left-sided
nephrolithiasis.

EXAM:
ABDOMEN - 1 VIEW

[abdomen kub (1 of 2)]
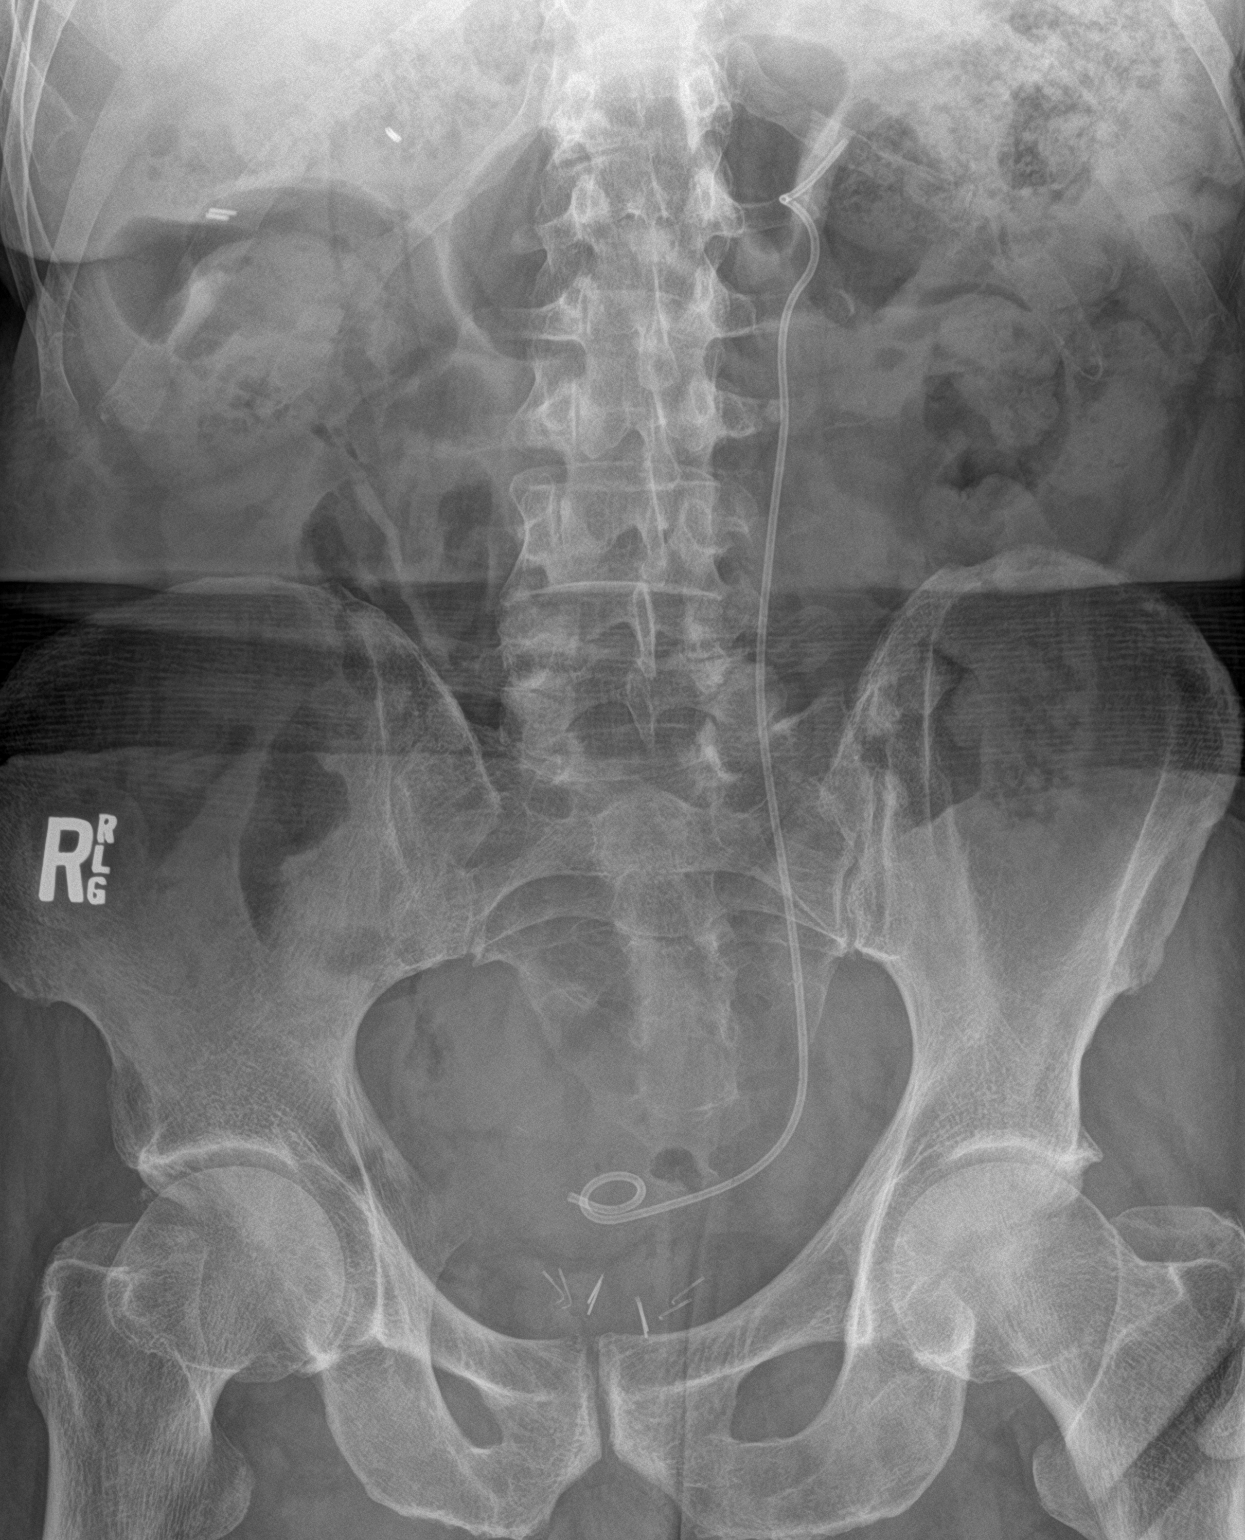

[abdomen kub (2 of 2)]
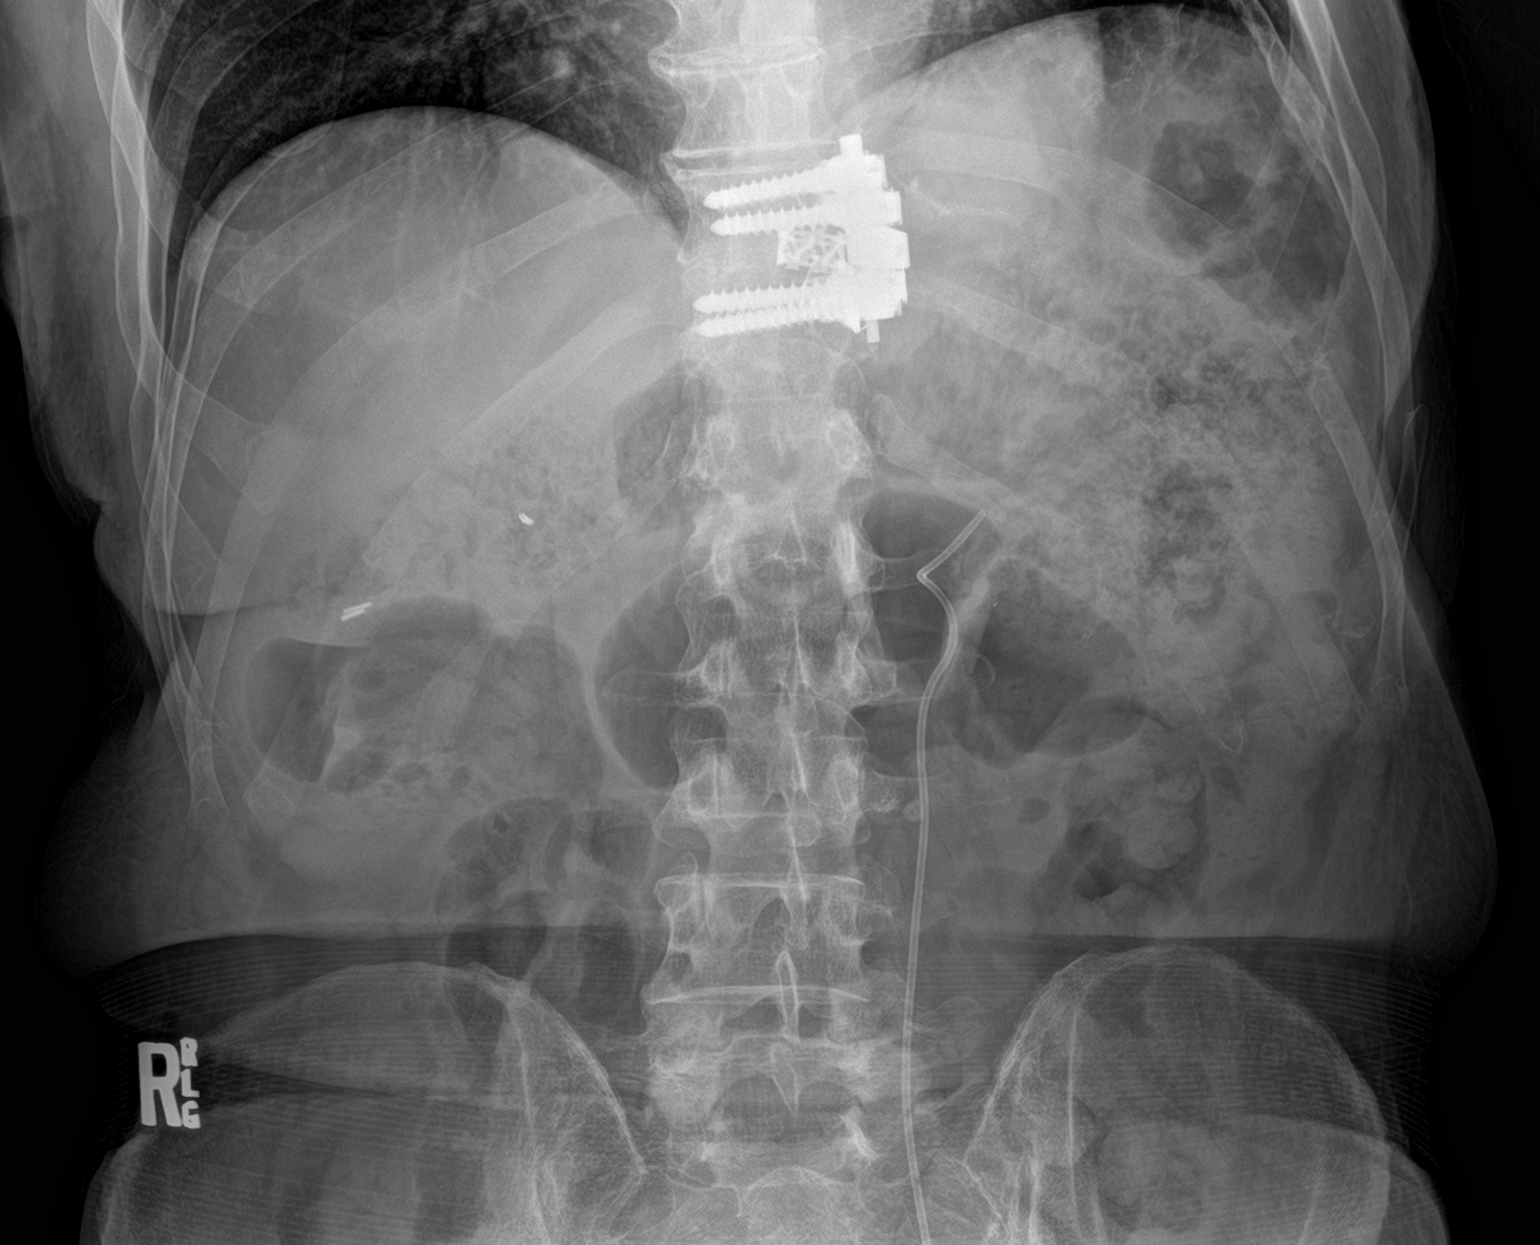

[2 of 2 positions shown; findings below may reference images not displayed]

CT abdomen
pelvis-[DATE]; intraoperative images during left-sided
retrograde stent placement.
FINDINGS: There is a punctate (approximately 6 mm) stone overlying the left L3
transverse process adjacent to the superior/mid aspect of the
left-sided double-J ureteral stent.

No additional stones overlie the expected location either renal
fossa, the right ureter or the urinary bladder.

Moderate to large colonic stool burden without evidence of enteric
obstruction. Surgical clips overlie the prostate. Post
cholecystectomy. Post T10-T11 paraspinal fusion intervertebral disc
space replacement, incompletely evaluated. No acute osseous
abnormalities.
IMPRESSION: Unchanged size and appearance of the punctate (approximately 6 mm)
known left-sided ureteral stone overlying the left L3 transverse
process.

## 2020-03-03 SURGERY — LITHOTRIPSY, ESWL
Anesthesia: LOCAL | Laterality: Left

## 2020-03-03 MED ORDER — CIPROFLOXACIN HCL 500 MG PO TABS
500.0000 mg | ORAL_TABLET | ORAL | Status: AC
  Administered 2020-03-03: 500 mg via ORAL

## 2020-03-03 MED ORDER — SODIUM CHLORIDE 0.9 % IV SOLN: INTRAVENOUS | Status: DC

## 2020-03-03 MED ORDER — DIPHENHYDRAMINE HCL 25 MG PO CAPS
ORAL_CAPSULE | ORAL | Status: AC
  Filled 2020-03-03: qty 1

## 2020-03-03 MED ORDER — DIAZEPAM 5 MG PO TABS
10.0000 mg | ORAL_TABLET | ORAL | Status: AC
  Administered 2020-03-03: 10 mg via ORAL

## 2020-03-03 MED ORDER — DIPHENHYDRAMINE HCL 25 MG PO CAPS
25.0000 mg | ORAL_CAPSULE | ORAL | Status: AC
  Administered 2020-03-03: 25 mg via ORAL

## 2020-03-03 MED ORDER — TAMSULOSIN HCL 0.4 MG PO CAPS: 0.4000 mg | ORAL_CAPSULE | ORAL | 0 refills | Status: DC

## 2020-03-03 MED ORDER — CIPROFLOXACIN HCL 500 MG PO TABS
ORAL_TABLET | ORAL | Status: AC
  Filled 2020-03-03: qty 1

## 2020-03-03 MED ORDER — DIAZEPAM 5 MG PO TABS
ORAL_TABLET | ORAL | Status: AC
  Filled 2020-03-03: qty 2

## 2020-03-03 NOTE — Discharge Instructions (Addendum)
Post Anesthesia Home Care Instructions  Activity: Get plenty of rest for the remainder of the day. A responsible adult should stay with you for 24 hours following the procedure.  For the next 24 hours, DO NOT: -Drive a car -Paediatric nurse -Drink alcoholic beverages -Take any medication unless instructed by your physician -Make any legal decisions or sign important papers.  Meals: Start with liquid foods such as gelatin or soup. Progress to regular foods as tolerated. Avoid greasy, spicy, heavy foods. If nausea and/or vomiting occur, drink only clear liquids until the nausea and/or vomiting subsides. Call your physician if vomiting continues.  Special Instructions/Symptoms: Your throat may feel dry or sore from the anesthesia or the breathing tube placed in your throat during surgery. If this causes discomfort, gargle with warm salt water. The discomfort should disappear within 24 hours.  If you had a scopolamine patch placed behind your ear for the management of post- operative nausea and/or vomiting:  1. The medication in the patch is effective for 72 hours, after which it should be removed.  Wrap patch in a tissue and discard in the trash. Wash hands thoroughly with soap and water. 2. You may remove the patch earlier than 72 hours if you experience unpleasant side effects which may include dry mouth, dizziness or visual disturbances. 3. Avoid touching the patch. Wash your hands with soap and water after contact with the patch.   Lithotripsy, Care After This sheet gives you information about how to care for yourself after your procedure. Your health care provider may also give you more specific instructions. If you have problems or questions, contact your health care provider. What can I expect after the procedure? After the procedure, it is common to have:  Some blood in your urine. This should only last for a few days.  Soreness in your back, sides, or upper abdomen for a few  days.  Blotches or bruises on your back where the pressure wave entered the skin.  Pain, discomfort, or nausea when pieces (fragments) of the kidney stone move through the tube that carries urine from the kidney to the bladder (ureter). Stone fragments may pass soon after the procedure, but they may continue to pass for up to 4-8 weeks. ? If you have severe pain or nausea, contact your health care provider. This may be caused by a large stone that was not broken up, and this may mean that you need more treatment.  Some pain or discomfort during urination.  Some pain or discomfort in the lower abdomen or (in men) at the base of the penis. Follow these instructions at home: Medicines  Take over-the-counter and prescription medicines only as told by your health care provider.  If you were prescribed an antibiotic medicine, take it as told by your health care provider. Do not stop taking the antibiotic even if you start to feel better.  Do not drive for 24 hours if you were given a medicine to help you relax (sedative).  Do not drive or use heavy machinery while taking prescription pain medicine.  Restart your Plavix in 48 hrs unless there is blood in the urine. If there is then call our office for recommendations as to when to restart it. Eating and drinking      Drink enough water and fluids to keep your urine clear or pale yellow. This helps any remaining pieces of the stone to pass. It can also help prevent new stones from forming.  Eat plenty of fresh  fruits and vegetables.  Follow instructions from your health care provider about eating and drinking restrictions. You may be instructed: ? To reduce how much salt (sodium) you eat or drink. Check ingredients and nutrition facts on packaged foods and beverages. ? To reduce how much meat you eat.  Eat the recommended amount of calcium for your age and gender. Ask your health care provider how much calcium you should have. General  instructions  Get plenty of rest.  Most people can resume normal activities 1-2 days after the procedure. Ask your health care provider what activities are safe for you.  Your health care provider may direct you to lie in a certain position (postural drainage) and tap firmly (percuss) over your kidney area to help stone fragments pass. Follow instructions as told by your health care provider.  If directed, strain all urine through the strainer that was provided by your health care provider. ? Keep all fragments for your health care provider to see. Any stones that are found may be sent to a medical lab for examination. The stone may be as small as a grain of salt.  Keep all follow-up visits as told by your health care provider. This is important. Contact a health care provider if:  You have pain that is severe or does not get better with medicine.  You have nausea that is severe or does not go away.  You have blood in your urine longer than your health care provider told you to expect.  You have more blood in your urine.  You have pain during urination that does not go away.  You urinate more frequently than usual and this does not go away.  You develop a rash or any other possible signs of an allergic reaction. Get help right away if:  You have severe pain in your back, sides, or upper abdomen.  You have severe pain while urinating.  Your urine is very dark red.  You have blood in your stool (feces).  You cannot pass any urine at all.  You feel a strong urge to urinate after emptying your bladder.  You have a fever or chills.  You develop shortness of breath, difficulty breathing, or chest pain.  You have severe nausea that leads to persistent vomiting.  You faint. Summary  After this procedure, it is common to have some pain, discomfort, or nausea when pieces (fragments) of the kidney stone move through the tube that carries urine from the kidney to the bladder  (ureter). If this pain or nausea is severe, however, you should contact your health care provider.  Most people can resume normal activities 1-2 days after the procedure. Ask your health care provider what activities are safe for you.  Drink enough water and fluids to keep your urine clear or pale yellow. This helps any remaining pieces of the stone to pass, and it can help prevent new stones from forming.  If directed, strain your urine and keep all fragments for your health care provider to see. Fragments or stones may be as small as a grain of salt.  Get help right away if you have severe pain in your back, sides, or upper abdomen or have severe pain while urinating. This information is not intended to replace advice given to you by your health care provider. Make sure you discuss any questions you have with your health care provider. Document Revised: 12/25/2018 Document Reviewed: 08/04/2016 Elsevier Patient Education  2020 [employer].

## 2020-03-03 NOTE — Op Note (Signed)
See [facility] OP note scanned into chart. Also because of the size, density, location and other factors that cannot be anticipated I feel this will likely be a staged procedure. This fact supersedes any indication in the scanned [facility] operative note to the contrary.  

## 2020-03-24 ENCOUNTER — Encounter: Payer: Self-pay | Admitting: Adult Health

## 2020-03-24 ENCOUNTER — Other Ambulatory Visit: Payer: Self-pay

## 2020-03-24 ENCOUNTER — Ambulatory Visit: Payer: Medicare Other | Admitting: Adult Health

## 2020-03-24 VITALS — BP 105/70 | HR 56 | Ht 67.0 in | Wt 157.0 lb

## 2020-03-24 DIAGNOSIS — Z8673 Personal history of transient ischemic attack (TIA), and cerebral infarction without residual deficits: Secondary | ICD-10-CM

## 2020-03-24 DIAGNOSIS — R260 Ataxic gait: Secondary | ICD-10-CM | POA: Diagnosis not present

## 2020-03-24 DIAGNOSIS — R42 Dizziness and giddiness: Secondary | ICD-10-CM

## 2020-03-24 NOTE — Patient Instructions (Signed)
Will obtain MRI brain as this was unable to be previously obtained  Recommend routine monitoring of your blood pressure as well as checking blood pressure during dizziness symptoms to ensure your blood pressures are going to low possibly causing the symptoms  If the patient does show evidence of stroke that could be contributing to symptoms, they recommend participating in physical therapy for possible improvement  Continue clopidogrel 75 mg daily  and atorvastatin for secondary stroke prevention -continue to follow with PCP for prescribing  Continue to follow up with PCP regarding cholesterol management  Continue to monitor blood pressure at home  Maintain blood pressure goal below 130/90 and cholesterol with LDL cholesterol (bad cholesterol) goal below 70 mg/dL. I also advised the patient to eat a healthy diet with plenty of whole grains, cereals, fruits and vegetables, exercise regularly and maintain ideal body weight.  Followup in the future with me in 3 months or call earlier if needed       Thank you for coming to see [place] at [facility]. I hope we have been able to provide you high quality care today.  You may receive a patient satisfaction survey over the next few weeks. We would appreciate your feedback and comments so that we may continue to improve ourselves and the health of our patients.

## 2020-03-24 NOTE — Progress Notes (Signed)
[facility] [address] [place]. [place] [address] [phone]       OFFICE FOLLOW-UP NOTE  Ryan Nicholson Date of Birth:  10-08-1960 Medical Record Number:  [id number]    Chief complaint: Chief Complaint  Patient presents with   Follow-up    Cerebellar ataxia, pt reports dizziness, and would like head imaging done     HPI:   Today, 03/24/2020, Ryan Nicholson returns for follow-up regarding history of stroke and complaints of dizziness and gait impairment.  Since prior visit, he continues to experience dizziness and gait impairment.  Denies worsening or new stroke/TIA symptoms.  CT cervical unremarkable for spinal stenosis or compression with only mild cervical spondylosis.   Spinal cord stimulator removed via laminectomy 01/02/2020 by Dr. Yetta Barre neurosurgery without complication.  Patient requesting obtaining MRI brain at this time as previously ordered but unable to be completed due to spinal cord stimulator.  Continues on clopidogrel and atorvastatin for secondary stroke prevention without side effects.  Blood pressure today 105/70.  He does not routinely monitor at home.  No further concerns at this time.    History copied from prior note for reference purposes only Update 12/03/2019 Dr. Pearlean Brownie : He returns for follow-up after last visit 3 months ago.  Is accompanied by his wife.  Patient states that he is still dizzy off balance and stumbles quite a lot.  He often bumps into objects.  This is not necessarily positional anymore and can happen at any times.  He is able to hold onto objects and avoid major falls or injuries.  The patient wants his cervical spinal cord stimulator removed as he feels it is really not helping him and it will hamper his ability to get the MRI in the future.  I did speak to Dr. Marikay Alar about this who felt that the patient's risk of complications with the removal procedure did not merit this and I shared this with the patient and his wife but  they still want it removed.  He does complain of some numbness in his toes but denies sensory loss.  He states he had EMG nerve conduction study done a few years ago should confirm some nerve damage from his previous spine surgeries.  Initial visit 08/16/2019 Dr. Pearlean Brownie: Ryan Nicholson is a 59 year old Caucasian male seen today for initial office follow-up visit following hospital admission for stroke in August 2020.  History is obtained from the patient, review of electronic medical records and I personally reviewed imaging films in PACS.  He presented on 05/17/2019 with sudden onset of right-sided weakness which was first noted upon awakening from sleep in the morning.  He noticed some numbness in the right side of the face as well and felt he was off balance.  His symptoms gradually improved over the course of the day.  He presented beyond time window for TPA.  CT scan of the head x2 showed no acute abnormality.  CT angiogram of the head and neck showed no large vessel stenosis only mild atherosclerotic changes.  MRI could not be done due to spinal cord stimulator.  2D echo showed normal ejection fraction without cardiac source of embolism.  LDL cholesterol was 46 mg percent hemoglobin A1c was 5.8.  Patient had a previous history of TIA in 2018 for which she was evaluated and treated in [place].  He did have family history of stroke in his father.  He has history of remote smoking and quit years ago.  He  had past neurological history of migraines in the past which had responded to Topamax and postherpetic neuralgia.as well as chronic history of chronic back pain related to workplace injury in the year 2000.  Patient had thoracic disc herniation at that time related to lifting heavy boxes.  Patient had surgery, spinal cord stimulator, and has been under chronic pain management since that time. He states is done well since discharge has had no further right-sided weakness and numbness.  He is tolerating Plavix  well without bleeding or bruising.  Blood pressure is under good control.  Is tolerating Lipitor well without muscle aches and pains.  He had recent lipid profile checked by primary physician and it was satisfactory.  He complains of dizziness lightheadedness and unsteadiness when he bends down and looks at his feet.  He denies true vertigo room spinning sensation when turning in bed.  He does occasionally get nauseous.  He states his spinal cord pain stimulator is not working and he plans to have it removed soon.    ROS:   14 system review of systems is positive for dizziness, lightheadedness, unsteadiness, falls, numbness in the feet, tingling pain, numbness and all other systems negative  PMH:  Past Medical History:  Diagnosis Date   B12 deficiency    Depression    Dizziness    DJD (degenerative joint disease)    Gastric ulcer    hx of   GERD (gastroesophageal reflux disease)    History of kidney stones    Hypercholesteremia    Migraine    Pneumonia    Post herpetic neuralgia    Stroke (HCC)    mild 2020 (weak right side), short term memory loss    Social History:  Social History   Socioeconomic History   Marital status: Married    Spouse name: Mary   Number of children: Not on file   Years of education: Not on file   Highest education level: Not on file  Occupational History   Not on file  Tobacco Use   Smoking status: Former Nicholson    Quit date: 02/14/2001    Years since quitting: 19.1   Smokeless tobacco: Never Used  Vaping Use   Vaping Use: Never used  Substance and Sexual Activity   Alcohol use: No   Drug use: No   Sexual activity: Not on file  Other Topics Concern   Not on file  Social History Narrative   Lives with wife   Social Determinants of Health   Financial Resource Strain:    Difficulty of Paying Living Expenses:   Food Insecurity:    Worried About Programme researcher, broadcasting/film/video in the Last Year:    Barista in the Last  Year:   Transportation Needs:    Freight forwarder (Medical):    Lack of Transportation (Non-Medical):   Physical Activity:    Days of Exercise per Week:    Minutes of Exercise per Session:   Stress:    Feeling of Stress :   Social Connections:    Frequency of Communication with Friends and Family:    Frequency of Social Gatherings with Friends and Family:    Attends Religious Services:    Active Member of Clubs or Organizations:    Attends Banker Meetings:    Marital Status:   Intimate Partner Violence:    Fear of Current or Ex-Partner:    Emotionally Abused:    Physically Abused:    Sexually Abused:  Medications:   Current Outpatient Medications on File Prior to Visit  Medication Sig Dispense Refill   albuterol (PROVENTIL HFA;VENTOLIN HFA) 108 (90 Base) MCG/ACT inhaler Inhale 2 puffs into the lungs every 4 (four) hours as needed for wheezing or shortness of breath.     atorvastatin (LIPITOR) 40 MG tablet Take 40 mg by mouth at bedtime.      baclofen (LIORESAL) 10 MG tablet Take 10 mg by mouth daily as needed for muscle spasms.      clopidogrel (PLAVIX) 75 MG tablet Take 75 mg by mouth daily.     cyanocobalamin (,VITAMIN B-12,) 1000 MCG/ML injection Inject 1 mL (1,000 mcg total) into the muscle every 30 (thirty) days. 3 mL 1   fluticasone (FLONASE) 50 MCG/ACT nasal spray Place 2 sprays into both nostrils daily. 16 g 11   Hyoscyamine Sulfate SL (LEVSIN/SL) 0.125 MG SUBL Place 0.125 mg under the tongue every 6 (six) hours as needed. 30 tablet 1   meloxicam (MOBIC) 15 MG tablet Take 15 mg by mouth daily as needed for pain.      montelukast (SINGULAIR) 10 MG tablet Take 10 mg by mouth daily.      NUCYNTA 75 MG tablet Take 75 mg by mouth every 6 (six) hours as needed for moderate pain or severe pain.      ondansetron (ZOFRAN) 8 MG tablet Take 1 tablet (8 mg total) by mouth every 8 (eight) hours as needed for nausea or vomiting. 20 tablet  1   sertraline (ZOLOFT) 100 MG tablet Take 100 mg by mouth daily.     tamsulosin (FLOMAX) 0.4 MG CAPS capsule Take 1 capsule (0.4 mg total) by mouth daily after supper. 30 capsule 0   Tapentadol HCl (NUCYNTA) 100 MG TABS Take 1 tablet (100 mg total) by mouth in the morning, at noon, in the evening, and at bedtime. 30 tablet 0   testosterone cypionate (DEPOTESTOSTERONE CYPIONATE) 200 MG/ML injection Inject 200 mg into the muscle every 30 (thirty) days.     topiramate (TOPAMAX) 100 MG tablet Take 100 mg by mouth at bedtime.     traZODone (DESYREL) 100 MG tablet Take 100 mg by mouth at bedtime.      UBRELVY 100 MG TABS Take 1 tablet by mouth as directed. Take 1 tablet (100 mg) up to BID A WEEK FOR MIGRAINE     No current facility-administered medications on file prior to visit.    Allergies:   Allergies  Allergen Reactions   Oxycontin [Oxycodone] Other (See Comments)    Unknown   Reglan [Metoclopramide] Nausea Only    shakey    Physical Exam  Today's Vitals   03/24/20 0737  BP: 105/70  Pulse: (!) 56  Weight: 157 lb (71.2 kg)  Height: 5\' 7"  (1.702 m)   Body mass index is 24.59 kg/m.  General:  Frail middle-aged Caucasian male seated, in no evident distress Head: head normocephalic and atraumatic.  Neck: supple with no carotid or supraclavicular bruits Cardiovascular: regular rate and rhythm, no murmurs Musculoskeletal: Severe kyphoscoliosis.  Mild restriction of neck movements due to muscle tightness. Skin:  no rash/petichiae Vascular:  Normal pulses all extremities  Neurologic Exam Mental Status: Awake and fully alert. Oriented to place and time. Recent and remote memory intact. Attention span, concentration and fund of knowledge appropriate. Mood and affect appropriate.  Cranial Nerves: Pupils equal, briskly reactive to light. Extraocular movements full without nystagmus. Visual fields full to confrontation. Hearing intact. Facial sensation intact. Face, tongue,  palate moves normally and symmetrically.  Motor: Normal bulk and tone. Normal strength in all tested extremity muscles. Sensory.: intact to touch ,pinprick .position  sensation.  Slight diminished vibration over toes bilaterally. Coordination: Rapid alternating movements normal in all extremities. Finger-to-nose and heel-to-shin performed accurately bilaterally. Gait and Station: Arises from chair without difficulty. Stance is hunched. Gait demonstrates normal stride length and balance with right toes pointed outward.  Romberg negative. Reflexes: 2+ and symmetric in upper extremities and depressed in both lower extremities. Toes downgoing.       ASSESSMENT/PLAN: 59 year old Caucasian male with transient right sided weakness possibly left hemispheric TIA versus small infarct from small vessel disease not visualized on CT scan x2.  Previously unable to do MRI due to spinal cord stimulator but SCS underwent removal via laminectomy on 04/28/9936 without complication.  Vascular risk factors of hyperlipidemia only.  Ongoing complaints of dizziness and gait instability of unclear etiology.   1.  Dizziness and unsteadiness  -Obtain MRI brain wo contrast to rule out underlying abnormalities  -No true vertigo type symptoms -advised to check blood pressure with dizziness episodes to ensure symptoms are not blood pressure related  -Depending on imaging results, may recommend participation in outpatient PT for possible improvement  2.  History of stroke/TIA  -Continue clopidogrel and atorvastatin for secondary stroke prevention as well as ongoing prescribing by PCP  -Continue to follow with PCP for HLD management with LDL goal less than 70   Follow-up in 3 months or call earlier if needed   I spent 25 minutes of face-to-face and non-face-to-face time with patient.  This included previsit chart review, lab review, study review, order entry, electronic health record documentation, patient  education  CC: Antony Contras, MD Sherald Hess., MD   Frann Rider, AGNP-BC  [facility] [address] [place], [place] [address]  Phone [phone] Fax [phone] Note: This document was prepared with digital dictation and possible smart phrase technology. Any transcriptional errors that result from this process are unintentional.

## 2020-03-24 NOTE — Progress Notes (Signed)
I agree with the above plan 

## 2020-03-25 ENCOUNTER — Telehealth: Payer: Self-pay | Admitting: Adult Health

## 2020-03-25 NOTE — Telephone Encounter (Signed)
No stimulator was removed back in April

## 2020-03-25 NOTE — Telephone Encounter (Signed)
Does the patient still have the Neuro stim because on 05/01/19  per MRI at [facility] pt can not have an mri because the Neuro stim was not MRI compatible.

## 2020-03-25 NOTE — Telephone Encounter (Signed)
Noted, thank you. [employer] medicare no auth order sent to GI they will reach out to the patient to schedule.

## 2020-04-06 ENCOUNTER — Other Ambulatory Visit: Payer: Self-pay

## 2020-04-06 ENCOUNTER — Ambulatory Visit
Admission: RE | Admit: 2020-04-06 | Discharge: 2020-04-06 | Disposition: A | Discharge status: Home / Self Care | Payer: Medicare Other | Source: Ambulatory Visit | Attending: Adult Health | Admitting: Adult Health

## 2020-04-06 DIAGNOSIS — R260 Ataxic gait: Secondary | ICD-10-CM | POA: Diagnosis not present

## 2020-04-06 DIAGNOSIS — R42 Dizziness and giddiness: Secondary | ICD-10-CM

## 2020-04-07 ENCOUNTER — Telehealth: Payer: Self-pay

## 2020-04-07 NOTE — Telephone Encounter (Signed)
Pt was notified of the message below from [facility] re: imaging results. Pt has no additional questions at this time

## 2020-07-07 ENCOUNTER — Ambulatory Visit: Payer: Medicare Other | Admitting: Adult Health

## 2020-08-03 ENCOUNTER — Emergency Department (HOSPITAL_COMMUNITY): Payer: Medicare Other

## 2020-08-03 ENCOUNTER — Emergency Department (HOSPITAL_COMMUNITY)
Admission: EM | Admit: 2020-08-03 | Discharge: 2020-08-03 | Disposition: A | Discharge status: Home / Self Care | Payer: Medicare Other | Attending: Emergency Medicine | Admitting: Emergency Medicine

## 2020-08-03 ENCOUNTER — Other Ambulatory Visit: Payer: Self-pay

## 2020-08-03 ENCOUNTER — Encounter (HOSPITAL_COMMUNITY): Payer: Self-pay | Admitting: Emergency Medicine

## 2020-08-03 DIAGNOSIS — R42 Dizziness and giddiness: Secondary | ICD-10-CM | POA: Diagnosis not present

## 2020-08-03 DIAGNOSIS — R519 Headache, unspecified: Secondary | ICD-10-CM | POA: Insufficient documentation

## 2020-08-03 DIAGNOSIS — Z87891 Personal history of nicotine dependence: Secondary | ICD-10-CM | POA: Insufficient documentation

## 2020-08-03 DIAGNOSIS — H9201 Otalgia, right ear: Secondary | ICD-10-CM | POA: Insufficient documentation

## 2020-08-03 LAB — CBC WITH DIFFERENTIAL/PLATELET
Abs Immature Granulocytes: 0.02 10*3/uL (ref 0.00–0.07)
Basophils Absolute: 0.1 10*3/uL (ref 0.0–0.1)
Basophils Relative: 1 %
Eosinophils Absolute: 0.1 10*3/uL (ref 0.0–0.5)
Eosinophils Relative: 2 %
HCT: 34.7 % — ABNORMAL LOW (ref 39.0–52.0)
Hemoglobin: 10.4 g/dL — ABNORMAL LOW (ref 13.0–17.0)
Immature Granulocytes: 0 %
Lymphocytes Relative: 22 %
Lymphs Abs: 1.3 10*3/uL (ref 0.7–4.0)
MCH: 25.1 pg — ABNORMAL LOW (ref 26.0–34.0)
MCHC: 30 g/dL (ref 30.0–36.0)
MCV: 83.6 fL (ref 80.0–100.0)
Monocytes Absolute: 0.5 10*3/uL (ref 0.1–1.0)
Monocytes Relative: 9 %
Neutro Abs: 4 10*3/uL (ref 1.7–7.7)
Neutrophils Relative %: 66 %
Platelets: 215 10*3/uL (ref 150–400)
RBC: 4.15 MIL/uL — ABNORMAL LOW (ref 4.22–5.81)
RDW: 20.9 % — ABNORMAL HIGH (ref 11.5–15.5)
WBC: 5.9 10*3/uL (ref 4.0–10.5)
nRBC: 0 % (ref 0.0–0.2)

## 2020-08-03 LAB — BASIC METABOLIC PANEL
Anion gap: 6 (ref 5–15)
BUN: 15 mg/dL (ref 6–20)
CO2: 24 mmol/L (ref 22–32)
Calcium: 9 mg/dL (ref 8.9–10.3)
Chloride: 109 mmol/L (ref 98–111)
Creatinine, Ser: 1.1 mg/dL (ref 0.61–1.24)
GFR, Estimated: >60 mL/min (ref >60)
Glucose, Bld: 98 mg/dL (ref 70–99)
Potassium: 4.4 mmol/L (ref 3.5–5.1)
Sodium: 139 mmol/L (ref 135–145)

## 2020-08-03 IMAGING — MR MR HEAD W/O CM
12 of 13 series · 44 of 48 positions shown · non-contrast
Comparison: MRI head [DATE]

CLINICAL DATA: Suspect stroke.  Right ear pain.

EXAM:
MRI HEAD WITHOUT CONTRAST
TECHNIQUE: Multiplanar, multiecho pulse sequences of the brain and surrounding
structures were obtained without intravenous contrast.

[Series 5: DWI · axial · 3.0mm · 0.88mm/px · z∈[-88,+58]mm · 8 of 100 slices shown (1 of 4)]
[im 1/100]
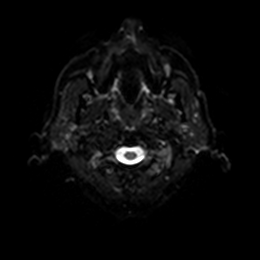
[im 15/100]
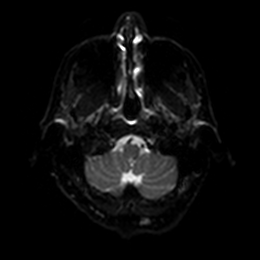
[im 29/100]
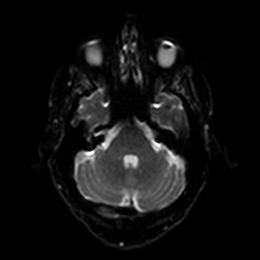
[im 43/100]
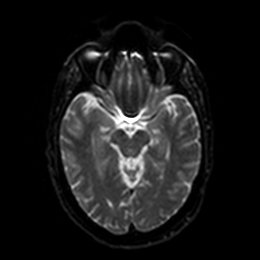
[im 57/100]
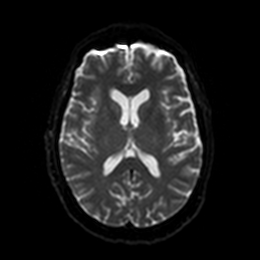
[im 71/100]
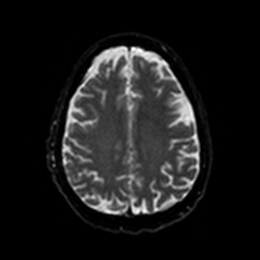
[im 85/100]
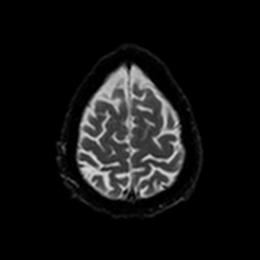
[im 100/100]
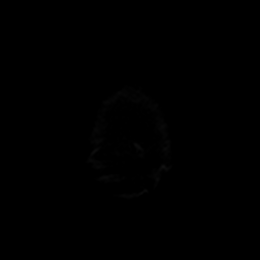

[Series 6: DWI · axial · 3.0mm · 0.88mm/px · z∈[-88,+58]mm · 4 of 50 slices shown (2 of 4)]
[im 1/50]
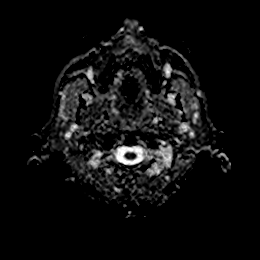
[im 17/50]
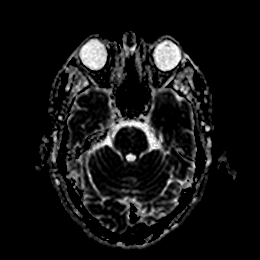
[im 33/50]
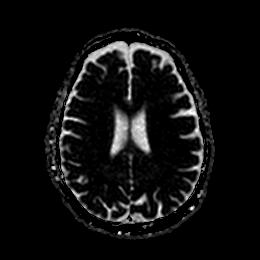
[im 50/50]
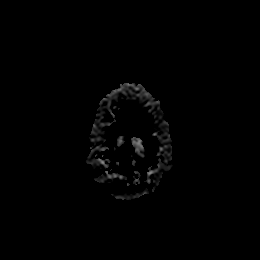

[Series 7: DWI · coronal · 4.0mm · 0.88mm/px · 5 of 70 slices shown (3 of 4)]
[im 1/70]
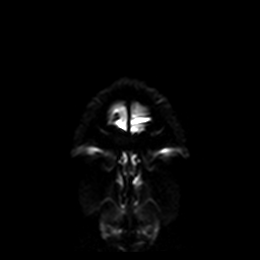
[im 18/70]
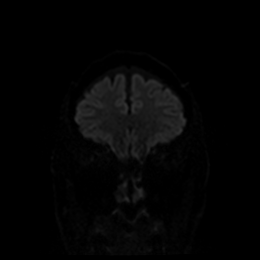
[im 35/70]
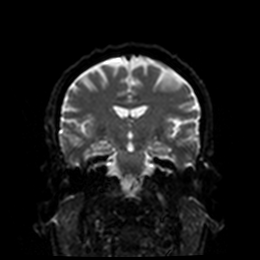
[im 52/70]
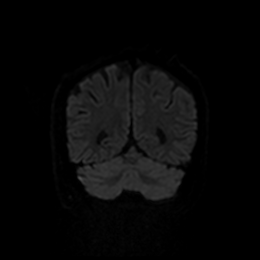
[im 70/70]
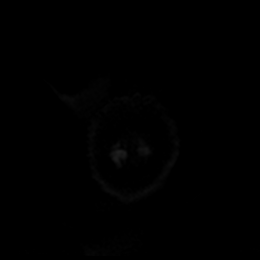

[Series 8: DWI · coronal · 4.0mm · 0.88mm/px · 3 of 35 slices shown (4 of 4)]
[im 1/35]
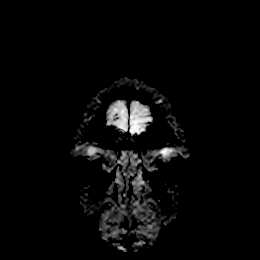
[im 18/35]
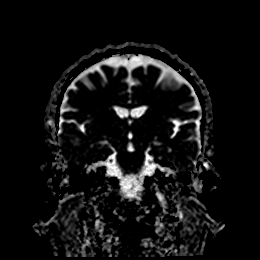
[im 35/35]
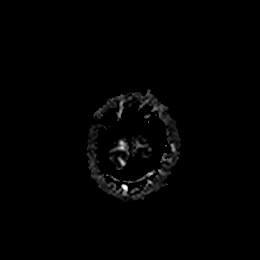

[Series 9: T1 · sagittal · 5.0mm · 0.75mm/px · 2 of 23 slices shown]
[im 1/23]
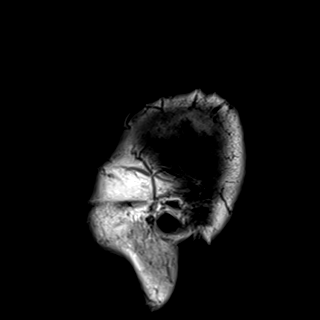
[im 23/23]
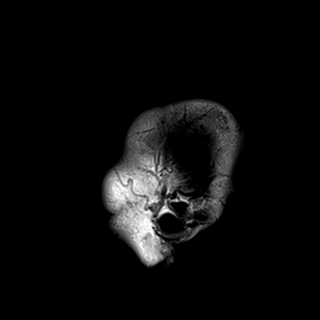

[Series 10: T2 · axial · 5.0mm · 0.72mm/px · z∈[-83,+61]mm · 2 of 25 slices shown (1 of 2)]
[im 1/25]
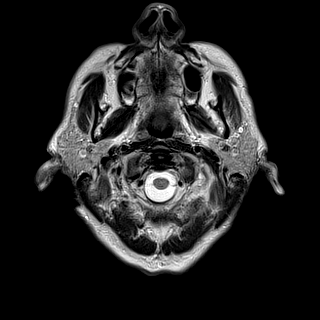
[im 25/25]
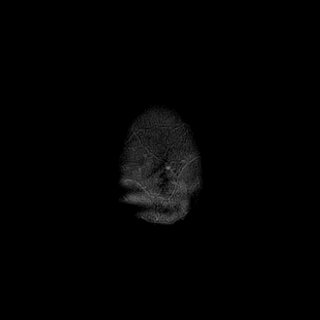

[Series 11: FLAIR · axial · 5.0mm · 0.45mm/px · z∈[-83,+61]mm · 2 of 25 slices shown]
[im 1/25]
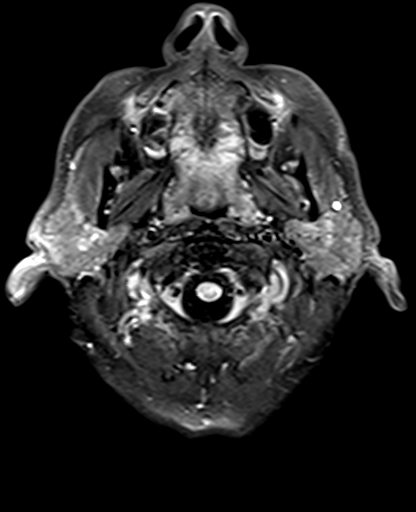
[im 25/25]
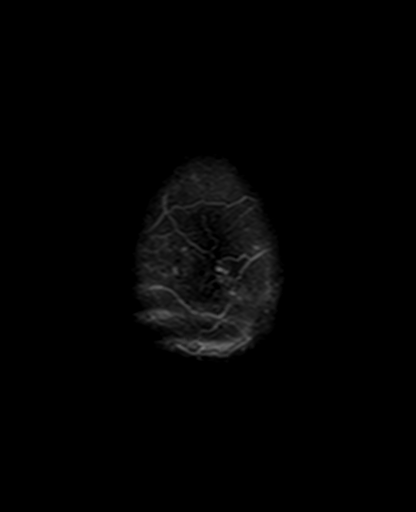

[Series 12: mag_images · axial · 3.0mm · 0.90mm/px · z∈[-108,+69]mm · 4 of 60 slices shown]
[im 1/60]
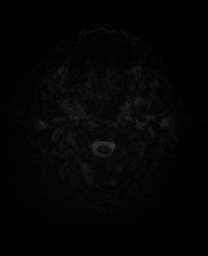
[im 20/60]
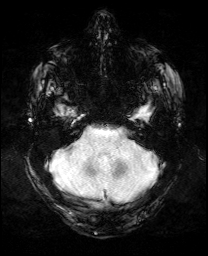
[im 40/60]
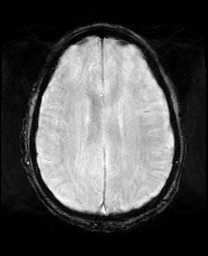
[im 60/60]
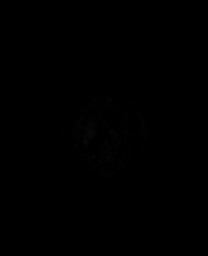

[Series 13: pha_images · axial · 3.0mm · 0.90mm/px · z∈[-108,+66]mm · 4 of 59 slices shown]
[im 1/59]
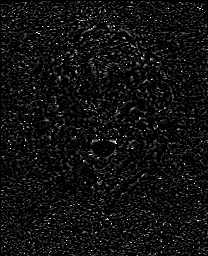
[im 20/59]
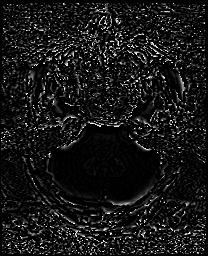
[im 39/59]
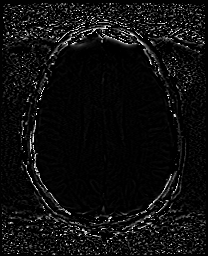
[im 59/59]
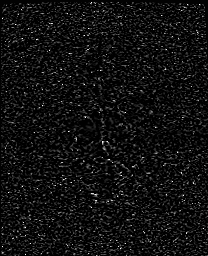

[Series 14: swi_images · axial · 3.0mm · 0.90mm/px · z∈[-108,+69]mm · 4 of 60 slices shown]
[im 1/60]
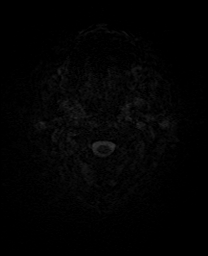
[im 20/60]
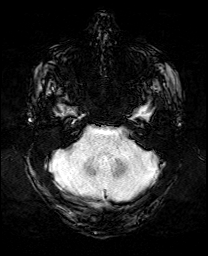
[im 40/60]
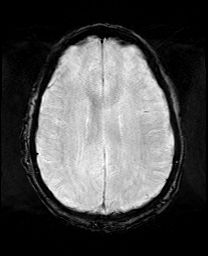
[im 60/60]
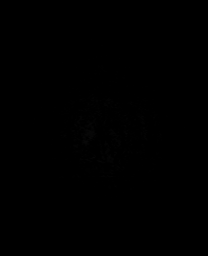

[Series 15: mip_images(sw) · axial · 24.0mm · 0.90mm/px · z∈[-98,+58]mm · 4 of 53 slices shown]
[im 1/53]
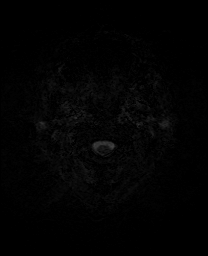
[im 18/53]
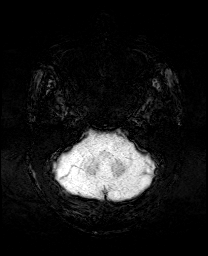
[im 35/53]
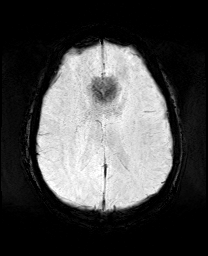
[im 53/53]
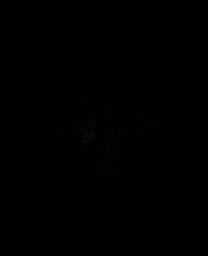

[Series 17: T2 · coronal · 5.0mm · 0.34mm/px · 2 of 29 slices shown (2 of 2)]
[im 1/29]
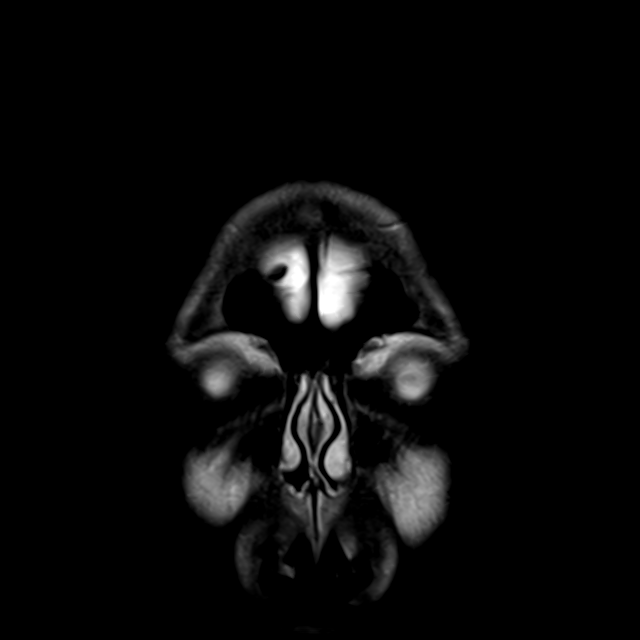
[im 29/29]
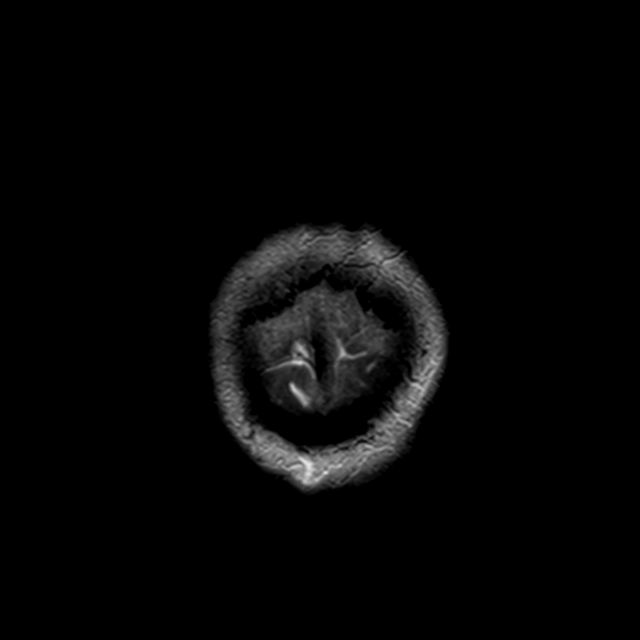

[44 of 48 positions shown; findings below may reference images not displayed]

FINDINGS: Brain: No acute infarction, hemorrhage, hydrocephalus, extra-axial
collection or mass lesion. Few small subcortical white matter
hyperintensities in the frontal white matter bilaterally unchanged
from the prior study. Brainstem and cerebellum normal.

Vascular: Normal arterial flow voids.

Skull and upper cervical spine: No focal skeletal lesion.

Sinuses/Orbits: Paranasal sinuses clear. No orbital lesion. Right
cataract extraction

Other: None
IMPRESSION: No acute abnormality. Mild chronic white matter changes may be due
to chronic microvascular ischemia or complex migraine headaches. No
change from MRI of [DATE].

## 2020-08-03 MED ORDER — AMOXICILLIN 500 MG PO TABS
1000.0000 mg | ORAL_TABLET | Freq: Two times a day (BID) | ORAL | 0 refills | Status: DC
Start: 2020-08-03 — End: 2022-04-29

## 2020-08-03 MED ORDER — DIPHENHYDRAMINE HCL 50 MG/ML IJ SOLN
25.0000 mg | Freq: Once | INTRAMUSCULAR | Status: AC
  Administered 2020-08-03: 25 mg via INTRAVENOUS
  Filled 2020-08-03: qty 1

## 2020-08-03 MED ORDER — PROCHLORPERAZINE EDISYLATE 10 MG/2ML IJ SOLN
10.0000 mg | Freq: Once | INTRAMUSCULAR | Status: AC
  Administered 2020-08-03: 10 mg via INTRAVENOUS
  Filled 2020-08-03: qty 2

## 2020-08-03 MED ORDER — DEXAMETHASONE 4 MG PO TABS
10.0000 mg | ORAL_TABLET | Freq: Once | ORAL | Status: AC
  Administered 2020-08-03: 10 mg via ORAL
  Filled 2020-08-03: qty 3

## 2020-08-03 MED ORDER — SODIUM CHLORIDE 0.9 % IV BOLUS
1000.0000 mL | Freq: Once | INTRAVENOUS | Status: AC
  Administered 2020-08-03: 1000 mL via INTRAVENOUS

## 2020-08-03 NOTE — ED Notes (Signed)
Pt d/c home per MD order. Discharge summary reviewed with pt, pt verbalizes understanding. No s/s of acute distress noted. Ambulatory off unit. Reports wife is discharge ride home.

## 2020-08-03 NOTE — Discharge Instructions (Signed)
Follow-up with your neurologist in the office.  Return for worsening symptoms or if you experience one-sided weakness numbness difficulty speech or swallowing.

## 2020-08-03 NOTE — ED Notes (Signed)
Pt transported to MRI 

## 2020-08-03 NOTE — ED Triage Notes (Signed)
Pt reports severe R sided ear pain and pain to R side of his head since Thursday.  States his 59 yr old has a BH history and was screaming in the car and he felt something pop in his ear.  Pt states, "this has happened before and I had a stroke."

## 2020-08-03 NOTE — ED Provider Notes (Signed)
[facility] Provider Note   CSN: [id number] Arrival date & time: 08/03/20  1350     History No chief complaint on file.   Ryan Nicholson is a 59 y.o. male.  59 yo M with a cc of right ear pain.  This happened when he was in the car about 5 days ago.  York SpanielSaid it was sharp and severe and feeling it radiated to his head and made him feel dizzy.  He actually describes some drooping of the right eyelid without.  This had resolved by the next day.  Has been having pain in that ear off and on for the next few days.  Today it was persistent throughout the day and that concerned him.  He has had a stroke before that he said he had similar symptoms.  Wanted to come to the ED to assess if he had had a stroke or not.    The history is provided by the patient.  Illness Severity:  Moderate Onset quality:  Gradual Duration:  5 days Timing:  Constant Progression:  Worsening Chronicity:  New Associated symptoms: headaches   Associated symptoms: no abdominal pain, no chest pain, no congestion, no diarrhea, no fever, no myalgias, no rash, no shortness of breath and no vomiting        Past Medical History:  Diagnosis Date  . B12 deficiency   . Depression   . Dizziness   . DJD (degenerative joint disease)   . Gastric ulcer    hx of  . GERD (gastroesophageal reflux disease)   . History of kidney stones   . Hypercholesteremia   . Migraine   . Pneumonia   . Post herpetic neuralgia   . Stroke (HCC)    mild 2020 (weak right side), short term memory loss    Patient Active Problem List   Diagnosis Date Noted  . UTI (urinary tract infection) 02/05/2020  . Left ureteral stone 02/04/2020  . S/P laminectomy 01/02/2020  . TIA (transient ischemic attack) 05/17/2019  . Anemia 10/06/2017  . Back pain 10/06/2017  . Noninfectious gastroenteritis and colitis 10/06/2017  . History of colonic polyps 10/06/2017  . Duodenal ulcer 10/06/2017  . Gall stones  10/06/2017  . IBS (irritable bowel syndrome) 10/06/2017  . Kidney stone 10/06/2017  . Migraines 10/06/2017  . Numbness and tingling of both feet 10/06/2017  . Pneumonia 10/06/2017  . Enlarged prostate 10/06/2017  . Gastro-esophageal reflux 10/06/2017  . Sleep apnea 10/06/2017  . Ulcer, stomach peptic 10/06/2017    Past Surgical History:  Procedure Laterality Date  . BACK SURGERY  09/1999, 01/2001  . CHOLECYSTECTOMY  2005  . COLON SURGERY    . CYSTOSCOPY W/ URETERAL STENT PLACEMENT Left 02/04/2020   Procedure: CYSTOSCOPY WITH RETROGRADE PYELOGRAM/URETERAL STENT PLACEMENT;  Surgeon: Bjorn PippinWrenn, John, MD;  Location: [facility];  Service: Urology;  Laterality: Left;  . EXTRACORPOREAL SHOCK WAVE LITHOTRIPSY Left 03/03/2020   Procedure: EXTRACORPOREAL SHOCK WAVE LITHOTRIPSY (ESWL);  Surgeon: Ihor Gullyttelin, Mark, MD;  [facility];  Service: Urology;  Laterality: Left;  . EYE SURGERY Right    cataract removal  . GASTRIC BYPASS  03/2004  . HEMORRHOID SURGERY  2010  . HERNIA REPAIR    . KNEE SURGERY  1980, 1985, 2015  . left shoulder surgery  2007  . LUNG SURGERY Left 2002  . PROSTATE SURGERY    . SPINAL CORD STIMULATOR REMOVAL N/A 01/02/2020   Procedure: Spinal cord stimulator removal  Lumbar;  Surgeon: Tia Alert, MD;  Location: [facility] OR;  Service: Neurosurgery;  Laterality: N/A;  . SPINE SURGERY     spinal stimulator  . TONSILECTOMY/ADENOIDECTOMY WITH MYRINGOTOMY    . TONSILLECTOMY         Family History  Problem Relation Age of Onset  . Breast cancer Mother   . Diabetes Father   . Hypertension Father   . Stroke Father   . Parkinson's disease Father   . Brain cancer Maternal Uncle     Social History   Tobacco Use  . Smoking status: Former Smoker    Quit date: 02/14/2001    Years since quitting: 19.4  . Smokeless tobacco: Never Used  Vaping Use  . Vaping Use: Never used  Substance Use Topics  . Alcohol use: No  . Drug use: No    Home Medications Prior to  Admission medications   Medication Sig Start Date End Date Taking? Authorizing Provider  albuterol (PROVENTIL HFA;VENTOLIN HFA) 108 (90 Base) MCG/ACT inhaler Inhale 2 puffs into the lungs every 4 (four) hours as needed for wheezing or shortness of breath.    [provider]  amoxicillin (AMOXIL) 500 MG tablet Take 2 tablets (1,000 mg total) by mouth 2 (two) times daily. 08/03/20   Melene Plan, DO  atorvastatin (LIPITOR) 40 MG tablet Take 40 mg by mouth at bedtime.  05/18/19   [provider]  baclofen (LIORESAL) 10 MG tablet Take 10 mg by mouth daily as needed for muscle spasms.     [provider]  clopidogrel (PLAVIX) 75 MG tablet Take 75 mg by mouth daily.    [provider]  cyanocobalamin (,VITAMIN B-12,) 1000 MCG/ML injection Inject 1 mL (1,000 mcg total) into the muscle every 30 (thirty) days. 06/03/17   Shade Flood, MD  fluticasone (FLONASE) 50 MCG/ACT nasal spray Place 2 sprays into both nostrils daily. 06/03/17   Shade Flood, MD  Hyoscyamine Sulfate SL (LEVSIN/SL) 0.125 MG SUBL Place 0.125 mg under the tongue every 6 (six) hours as needed. 02/05/20   Bjorn Pippin, MD  meloxicam (MOBIC) 15 MG tablet Take 15 mg by mouth daily as needed for pain.  01/01/20   [provider]  montelukast (SINGULAIR) 10 MG tablet Take 10 mg by mouth daily.  06/07/19   [provider]  NUCYNTA 75 MG tablet Take 75 mg by mouth every 6 (six) hours as needed for moderate pain or severe pain.  01/09/20   [provider]  ondansetron (ZOFRAN) 8 MG tablet Take 1 tablet (8 mg total) by mouth every 8 (eight) hours as needed for nausea or vomiting. 06/03/17   Shade Flood, MD  sertraline (ZOLOFT) 100 MG tablet Take 100 mg by mouth daily.    [provider]  tamsulosin (FLOMAX) 0.4 MG CAPS capsule Take 1 capsule (0.4 mg total) by mouth daily after supper. 03/03/20   Ihor Gully, MD  Tapentadol HCl (NUCYNTA) 100 MG TABS Take 1 tablet (100 mg total) by  mouth in the morning, at noon, in the evening, and at bedtime. 01/03/20   Meyran, Tiana Loft, NP  testosterone cypionate (DEPOTESTOSTERONE CYPIONATE) 200 MG/ML injection Inject 200 mg into the muscle every 30 (thirty) days. 12/06/19   [provider]  topiramate (TOPAMAX) 100 MG tablet Take 100 mg by mouth at bedtime.    [provider]  traZODone (DESYREL) 100 MG tablet Take 100 mg by mouth at bedtime.  07/06/19   [provider]  Bernita Raisin  100 MG TABS Take 1 tablet by mouth as directed. Take 1 tablet (100 mg) up to BID A WEEK FOR MIGRAINE 01/13/20   [provider]    Allergies    Oxycontin [oxycodone] and Reglan [metoclopramide]  Review of Systems   Review of Systems  Constitutional: Negative for chills and fever.  HENT: Negative for congestion and facial swelling.   Eyes: Negative for discharge and visual disturbance.  Respiratory: Negative for shortness of breath.   Cardiovascular: Negative for chest pain and palpitations.  Gastrointestinal: Negative for abdominal pain, diarrhea and vomiting.  Musculoskeletal: Negative for arthralgias and myalgias.  Skin: Negative for color change and rash.  Neurological: Positive for numbness and headaches. Negative for tremors and syncope.  Psychiatric/Behavioral: Negative for confusion and dysphoric mood.    Physical Exam Updated Vital Signs BP 99/61   Pulse (!) 56   Temp 98 F (36.7 C) (Oral)   Resp 10   Ht 5\' 7"  (1.702 m)   Wt 65.3 kg   SpO2 100%   BMI 22.55 kg/m   Physical Exam Vitals and nursing note reviewed.  Constitutional:      Appearance: He is well-developed.  HENT:     Head: Normocephalic and atraumatic.     Comments: Large amounts of hair inside the ear canal partially obstructs the view.  Patient has some dullness to the right TM.  No obvious effusion or bulging. Eyes:     Pupils: Pupils are equal, round, and reactive to light.  Neck:     Vascular: No JVD.  Cardiovascular:      Rate and Rhythm: Normal rate and regular rhythm.     Heart sounds: No murmur heard.  No friction rub. No gallop.   Pulmonary:     Effort: No respiratory distress.     Breath sounds: No wheezing.  Abdominal:     General: There is no distension.     Tenderness: There is no guarding or rebound.  Musculoskeletal:        General: Normal range of motion.     Cervical back: Normal range of motion and neck supple.  Skin:    Coloration: Skin is not pale.     Findings: No rash.  Neurological:     Mental Status: He is alert and oriented to person, place, and time.     GCS: GCS eye subscore is 4. GCS verbal subscore is 5. GCS motor subscore is 6.     Cranial Nerves: Cranial nerves are intact.     Sensory: Sensation is intact.     Motor: Motor function is intact.     Coordination: Coordination is intact.     Comments: Benign neurologic exam.  Psychiatric:        Behavior: Behavior normal.     ED Results / Procedures / Treatments   Labs (all labs ordered are listed, but only abnormal results are displayed) Labs Reviewed  CBC WITH DIFFERENTIAL/PLATELET - Abnormal; Notable for the following components:      Result Value   RBC 4.15 (*)    Hemoglobin 10.4 (*)    HCT 34.7 (*)    MCH 25.1 (*)    RDW 20.9 (*)    All other components within normal limits  BASIC METABOLIC PANEL    EKG None  Radiology MR BRAIN WO CONTRAST  Result Date: 08/03/2020 CLINICAL DATA:  Suspect stroke.  Right ear pain. EXAM: MRI HEAD WITHOUT CONTRAST TECHNIQUE: Multiplanar, multiecho pulse sequences of the brain and surrounding structures  were obtained without intravenous contrast. COMPARISON:  MRI head 04/06/2020 FINDINGS: Brain: No acute infarction, hemorrhage, hydrocephalus, extra-axial collection or mass lesion. Few small subcortical white matter hyperintensities in the frontal white matter bilaterally unchanged from the prior study. Brainstem and cerebellum normal. Vascular: Normal arterial flow voids. Skull  and upper cervical spine: No focal skeletal lesion. Sinuses/Orbits: Paranasal sinuses clear. No orbital lesion. Right cataract extraction Other: None IMPRESSION: No acute abnormality. Mild chronic white matter changes may be due to chronic microvascular ischemia or complex migraine headaches. No change from MRI of 04/06/2020. Electronically Signed   By: Marlan Palau M.D.   On: 08/03/2020 16:14    Procedures Procedures (including critical care time)  Medications Ordered in ED Medications  dexamethasone (DECADRON) tablet 10 mg (has no administration in time range)  prochlorperazine (COMPAZINE) injection 10 mg (10 mg Intravenous Given 08/03/20 1519)  diphenhydrAMINE (BENADRYL) injection 25 mg (25 mg Intravenous Given 08/03/20 1518)  sodium chloride 0.9 % bolus 1,000 mL (1,000 mLs Intravenous New Bag/Given 08/03/20 1516)    ED Course  I have reviewed the triage vital signs and the nursing notes.  Pertinent labs & imaging results that were available during my care of the patient were reviewed by me and considered in my medical decision making (see chart for details).    MDM Rules/Calculators/A&P                          59 yo M with a chief complaints of right ear pain and headache.  This been going on for about 5 days now.  Feels like it is atypical of his normal migraines.  Had some right facial droop it sounds like at the onset but is resolved.  Patient has a history of stroke and thinks that has presented similarly.  Will obtain an MRI.  Blood work.  Headache cocktail.  Reassess.  Patient feeling better after headache cocktail.  MRI is negative for acute stroke.  Will discharge the patient home.  Antibiotics for possible otitis.  Neurology follow-up.    4:22 PM:  I have discussed the diagnosis/risks/treatment options with the patient and believe the pt to be eligible for discharge home to follow-up with Neuro. We also discussed returning to the ED immediately if new or worsening sx occur. We  discussed the sx which are most concerning (e.g., sudden worsening pain, fever, inability to tolerate by mouth) that necessitate immediate return. Medications administered to the patient during their visit and any new prescriptions provided to the patient are listed below.  Medications given during this visit Medications  dexamethasone (DECADRON) tablet 10 mg (has no administration in time range)  prochlorperazine (COMPAZINE) injection 10 mg (10 mg Intravenous Given 08/03/20 1519)  diphenhydrAMINE (BENADRYL) injection 25 mg (25 mg Intravenous Given 08/03/20 1518)  sodium chloride 0.9 % bolus 1,000 mL (1,000 mLs Intravenous New Bag/Given 08/03/20 1516)     The patient appears reasonably screen and/or stabilized for discharge and I doubt any other medical condition or other [facility] requiring further screening, evaluation, or treatment in the ED at this time prior to discharge.  Final Clinical Impression(s) / ED Diagnoses Final diagnoses:  Right ear pain    Rx / DC Orders ED Discharge Orders         Ordered    amoxicillin (AMOXIL) 500 MG tablet  2 times daily        08/03/20 1620           Adela Lank,  Yulisa Chirico, DO 08/03/20 1622

## 2021-12-29 ENCOUNTER — Emergency Department (HOSPITAL_COMMUNITY)
Admission: EM | Admit: 2021-12-29 | Discharge: 2021-12-29 | Disposition: A | Discharge status: Home Health | Payer: Medicare Other | Attending: Emergency Medicine | Admitting: Emergency Medicine

## 2021-12-29 ENCOUNTER — Emergency Department (HOSPITAL_COMMUNITY): Payer: Medicare Other

## 2021-12-29 ENCOUNTER — Encounter (HOSPITAL_COMMUNITY): Payer: Self-pay

## 2021-12-29 ENCOUNTER — Other Ambulatory Visit: Payer: Self-pay

## 2021-12-29 DIAGNOSIS — I639 Cerebral infarction, unspecified: Secondary | ICD-10-CM

## 2021-12-29 DIAGNOSIS — W458XXA Other foreign body or object entering through skin, initial encounter: Secondary | ICD-10-CM | POA: Insufficient documentation

## 2021-12-29 DIAGNOSIS — Y9389 Activity, other specified: Secondary | ICD-10-CM | POA: Insufficient documentation

## 2021-12-29 DIAGNOSIS — E7800 Pure hypercholesterolemia, unspecified: Secondary | ICD-10-CM | POA: Insufficient documentation

## 2021-12-29 DIAGNOSIS — F32A Depression, unspecified: Secondary | ICD-10-CM | POA: Diagnosis not present

## 2021-12-29 DIAGNOSIS — R791 Abnormal coagulation profile: Secondary | ICD-10-CM | POA: Insufficient documentation

## 2021-12-29 DIAGNOSIS — R531 Weakness: Secondary | ICD-10-CM | POA: Diagnosis not present

## 2021-12-29 DIAGNOSIS — I69351 Hemiplegia and hemiparesis following cerebral infarction affecting right dominant side: Secondary | ICD-10-CM | POA: Insufficient documentation

## 2021-12-29 DIAGNOSIS — S01412A Laceration without foreign body of left cheek and temporomandibular area, initial encounter: Secondary | ICD-10-CM | POA: Insufficient documentation

## 2021-12-29 DIAGNOSIS — I6782 Cerebral ischemia: Secondary | ICD-10-CM | POA: Diagnosis not present

## 2021-12-29 DIAGNOSIS — Z20822 Contact with and (suspected) exposure to covid-19: Secondary | ICD-10-CM | POA: Insufficient documentation

## 2021-12-29 DIAGNOSIS — Z87891 Personal history of nicotine dependence: Secondary | ICD-10-CM | POA: Insufficient documentation

## 2021-12-29 DIAGNOSIS — R42 Dizziness and giddiness: Secondary | ICD-10-CM | POA: Insufficient documentation

## 2021-12-29 DIAGNOSIS — Z7902 Long term (current) use of antithrombotics/antiplatelets: Secondary | ICD-10-CM | POA: Diagnosis not present

## 2021-12-29 DIAGNOSIS — R519 Headache, unspecified: Secondary | ICD-10-CM | POA: Insufficient documentation

## 2021-12-29 DIAGNOSIS — L03211 Cellulitis of face: Secondary | ICD-10-CM | POA: Diagnosis not present

## 2021-12-29 DIAGNOSIS — R2990 Unspecified symptoms and signs involving the nervous system: Secondary | ICD-10-CM

## 2021-12-29 LAB — COMPREHENSIVE METABOLIC PANEL
ALT: 16 U/L (ref 0–44)
AST: 27 U/L (ref 15–41)
Albumin: 3.8 g/dL (ref 3.5–5.0)
Alkaline Phosphatase: 62 U/L (ref 38–126)
Anion gap: 6 (ref 5–15)
BUN: 14 mg/dL (ref 8–23)
CO2: 24 mmol/L (ref 22–32)
Calcium: 9.2 mg/dL (ref 8.9–10.3)
Chloride: 107 mmol/L (ref 98–111)
Creatinine, Ser: 1 mg/dL (ref 0.61–1.24)
GFR, Estimated: >60 mL/min (ref >60)
Glucose, Bld: 100 mg/dL — ABNORMAL HIGH (ref 70–99)
Potassium: 4.2 mmol/L (ref 3.5–5.1)
Sodium: 137 mmol/L (ref 135–145)
Total Bilirubin: 0.8 mg/dL (ref 0.3–1.2)
Total Protein: 6.9 g/dL (ref 6.5–8.1)

## 2021-12-29 LAB — CBC
HCT: 42.2 % (ref 39.0–52.0)
Hemoglobin: 13.9 g/dL (ref 13.0–17.0)
MCH: 31.1 pg (ref 26.0–34.0)
MCHC: 32.9 g/dL (ref 30.0–36.0)
MCV: 94.4 fL (ref 80.0–100.0)
Platelets: 153 10*3/uL (ref 150–400)
RBC: 4.47 MIL/uL (ref 4.22–5.81)
RDW: 13.5 % (ref 11.5–15.5)
WBC: 10.1 10*3/uL (ref 4.0–10.5)
nRBC: 0 % (ref 0.0–0.2)

## 2021-12-29 LAB — DIFFERENTIAL
Abs Immature Granulocytes: 0.04 10*3/uL (ref 0.00–0.07)
Basophils Absolute: 0.1 10*3/uL (ref 0.0–0.1)
Basophils Relative: 1 %
Eosinophils Absolute: 0 10*3/uL (ref 0.0–0.5)
Eosinophils Relative: 0 %
Immature Granulocytes: 0 %
Lymphocytes Relative: 12 %
Lymphs Abs: 1.2 10*3/uL (ref 0.7–4.0)
Monocytes Absolute: 1 10*3/uL (ref 0.1–1.0)
Monocytes Relative: 10 %
Neutro Abs: 7.7 10*3/uL (ref 1.7–7.7)
Neutrophils Relative %: 77 %

## 2021-12-29 LAB — URINALYSIS, ROUTINE W REFLEX MICROSCOPIC
Bilirubin Urine: NEGATIVE
Glucose, UA: NEGATIVE mg/dL
Hgb urine dipstick: NEGATIVE
Ketones, ur: 5 mg/dL — AB
Leukocytes,Ua: NEGATIVE
Nitrite: NEGATIVE
Protein, ur: NEGATIVE mg/dL
Specific Gravity, Urine: 1.014 (ref 1.005–1.030)
pH: 7 (ref 5.0–8.0)

## 2021-12-29 LAB — RESP PANEL BY RT-PCR (FLU A&B, COVID) ARPGX2
Influenza A by PCR: NEGATIVE
Influenza B by PCR: NEGATIVE
SARS Coronavirus 2 by RT PCR: NEGATIVE

## 2021-12-29 LAB — PROTIME-INR
INR: 1.1 (ref 0.8–1.2)
Prothrombin Time: 14.3 seconds (ref 11.4–15.2)

## 2021-12-29 LAB — APTT: aPTT: 30 seconds (ref 24–36)

## 2021-12-29 LAB — RAPID URINE DRUG SCREEN, HOSP PERFORMED
Amphetamines: NOT DETECTED
Barbiturates: NOT DETECTED
Benzodiazepines: NOT DETECTED
Cocaine: NOT DETECTED
Opiates: NOT DETECTED
Tetrahydrocannabinol: NOT DETECTED

## 2021-12-29 LAB — ETHANOL: Alcohol, Ethyl (B): <10 mg/dL (ref <10)

## 2021-12-29 LAB — CBG MONITORING, ED: Glucose-Capillary: 89 mg/dL (ref 70–99)

## 2021-12-29 IMAGING — MR MR HEAD W/O CM
7 of 11 series · 25 of 48 positions shown · non-contrast
Comparison: CT head [DATE]

CLINICAL DATA: Suspect stroke

EXAM:
MRI HEAD WITHOUT CONTRAST
TECHNIQUE: Multiplanar, multiecho pulse sequences of the brain and surrounding
structures were obtained without intravenous contrast.

[Series 2: DWI · axial · 3.0mm · 0.94mm/px · z∈[-39,+106]mm · 7 of 100 slices shown (1 of 2)]
[im 1/100]
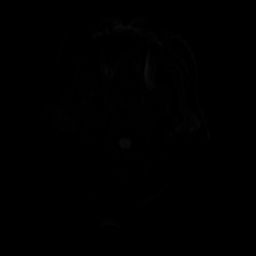
[im 17/100]
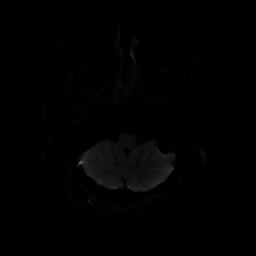
[im 34/100]
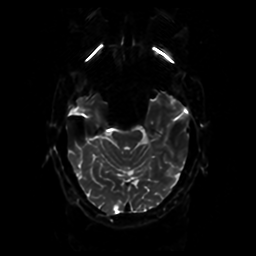
[im 50/100]
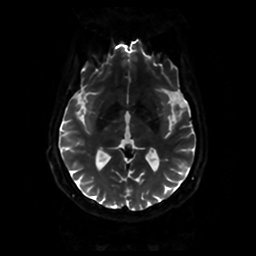
[im 67/100]
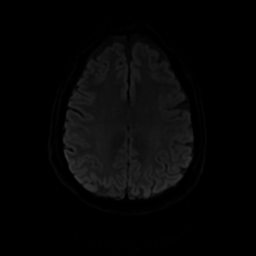
[im 83/100]
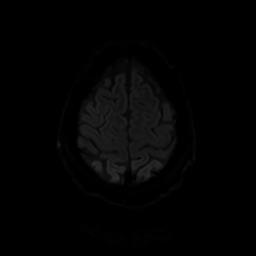
[im 100/100]
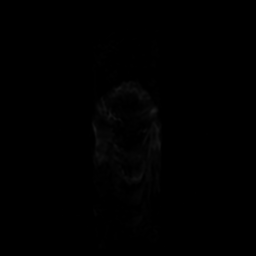

[Series 3: DWI · coronal · 4.0mm · 0.94mm/px · 5 of 72 slices shown (2 of 2)]
[im 1/72]
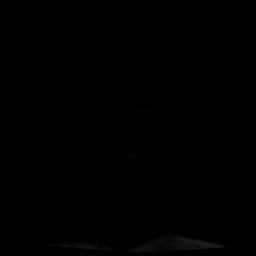
[im 18/72]
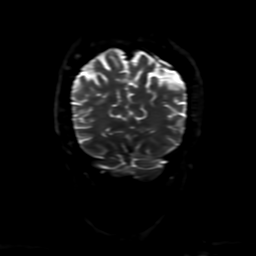
[im 36/72]
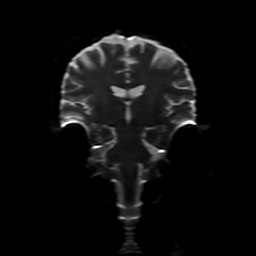
[im 54/72]
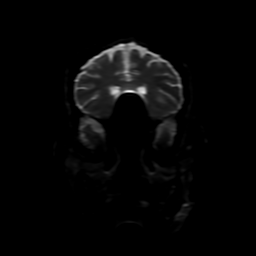
[im 72/72]
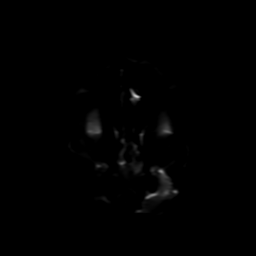

[Series 4: T2 · axial · 5.0mm · 0.23mm/px · 1 of 26 slices shown]
[im 1/26]
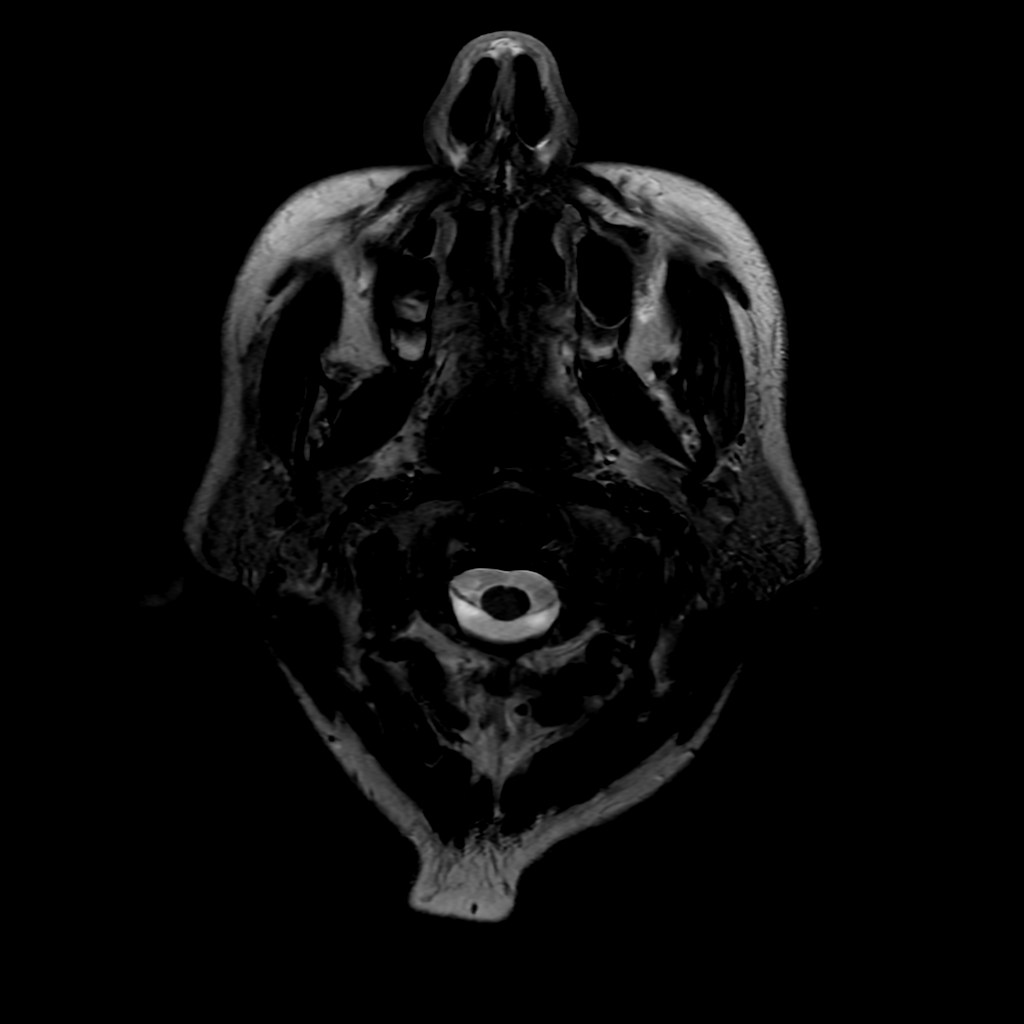

[Series 5: FLAIR · sagittal · 5.0mm · 0.23mm/px · 2 of 23 slices shown (1 of 2)]
[im 1/23]
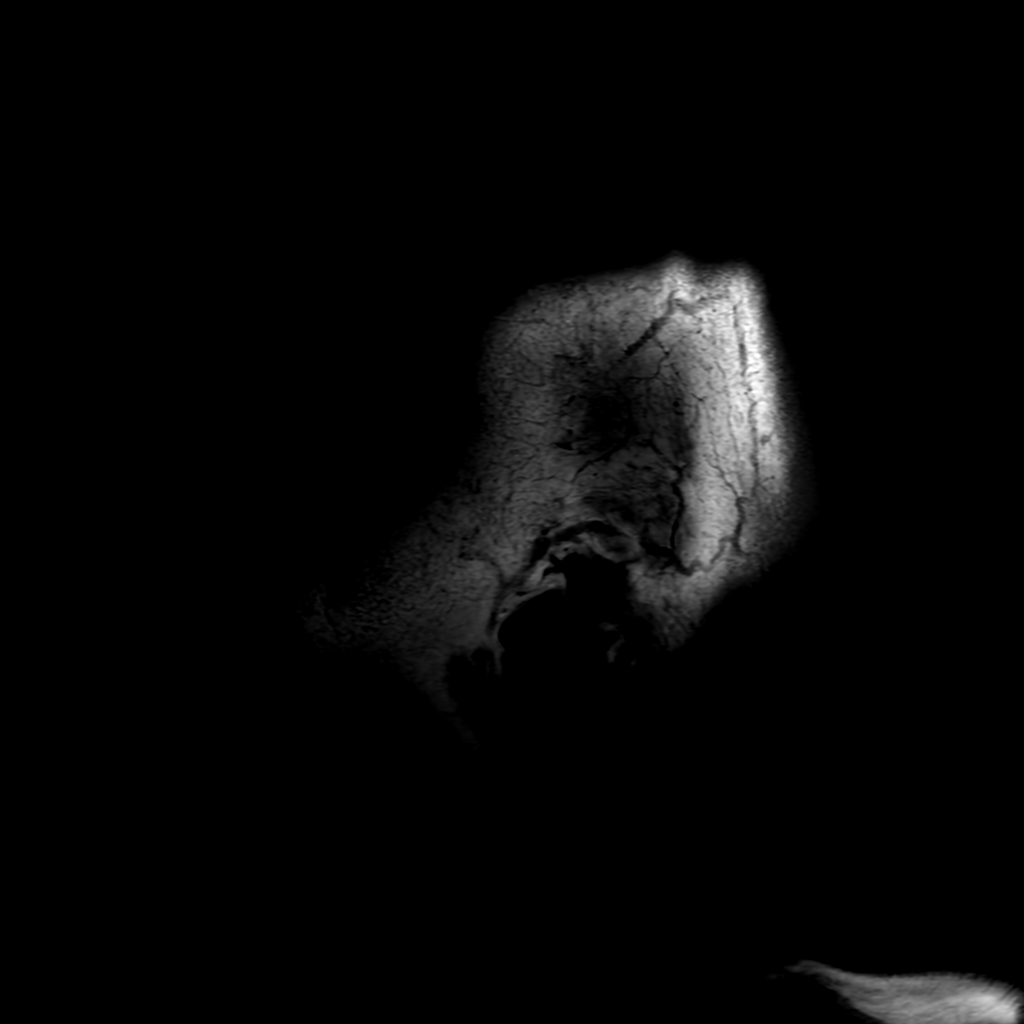
[im 23/23]
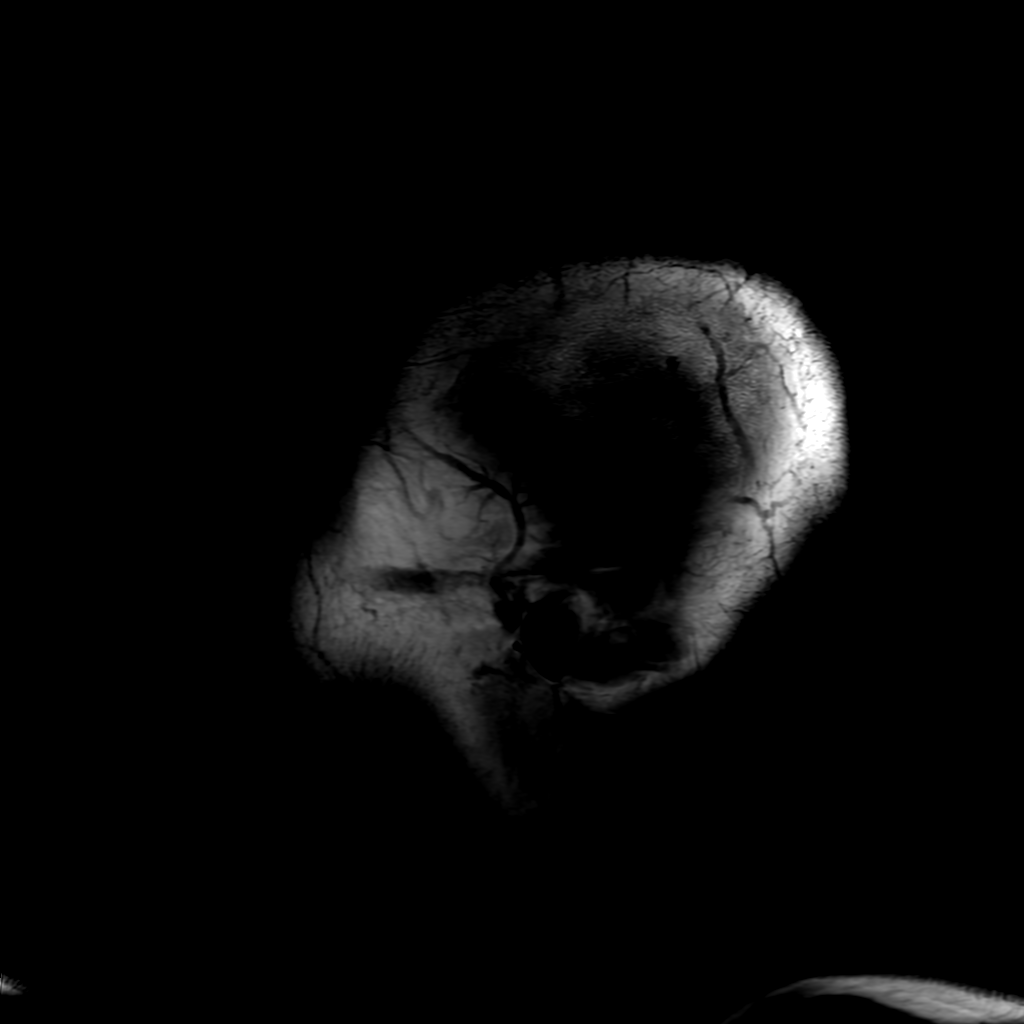

[Series 6: FLAIR · axial · 4.0mm · 0.47mm/px · z∈[-20,+122]mm · 3 of 35 slices shown (2 of 2)]
[im 1/35]
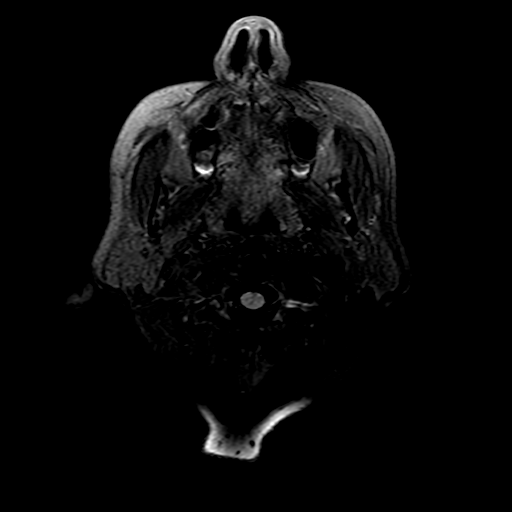
[im 18/35]
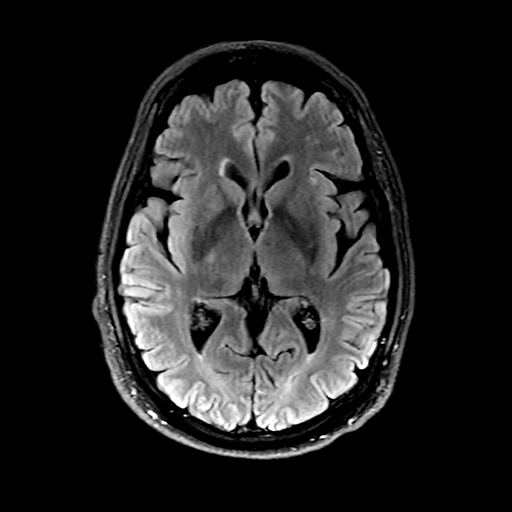
[im 35/35]
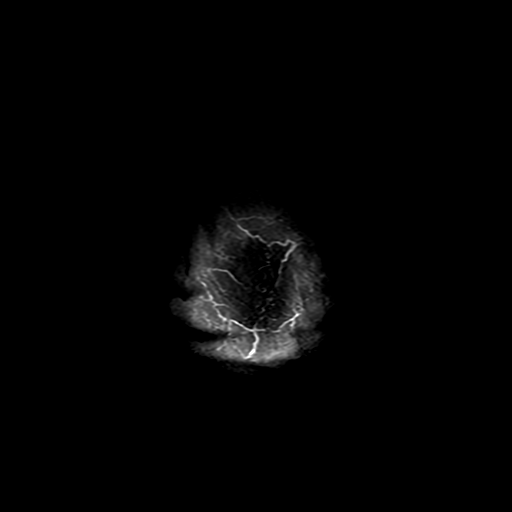

[Series 250: ADC · axial · 3.0mm · 0.94mm/px · z∈[-39,+106]mm · 4 of 50 slices shown (1 of 2)]
[im 1/50]
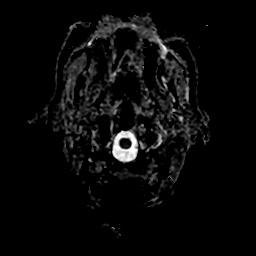
[im 17/50]
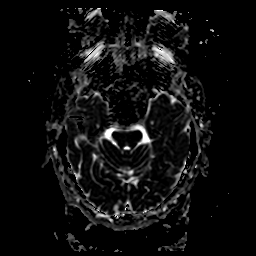
[im 33/50]
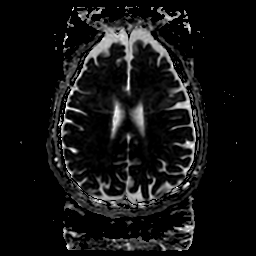
[im 50/50]
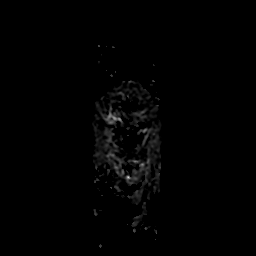

[Series 350: ADC · coronal · 4.0mm · 0.94mm/px · 3 of 37 slices shown (2 of 2)]
[im 1/37]
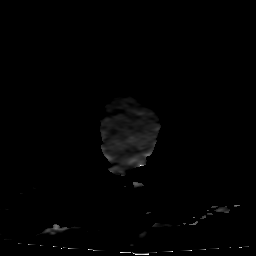
[im 19/37]
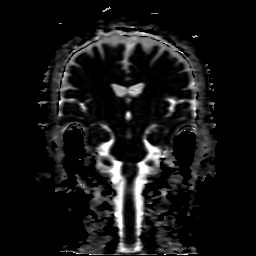
[im 37/37]
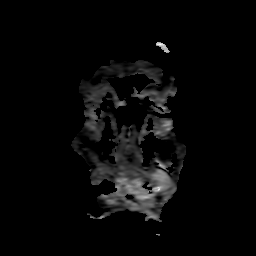

[25 of 48 positions shown; findings below may reference images not displayed]

FINDINGS: Brain: No acute infarction, hemorrhage, hydrocephalus, extra-axial
collection or mass lesion. Few small hyperintensities in the frontal
white matter bilaterally.

Vascular: Normal arterial flow voids at the skull base.

Skull and upper cervical spine: Negative

Sinuses/Orbits: Paranasal sinuses clear. Negative orbit. Bilateral
cataract extraction

Other: None
IMPRESSION: No acute intracranial abnormality. Mild white matter changes likely
chronic microvascular ischemia.

## 2021-12-29 IMAGING — CT CT HEAD CODE STROKE
3 series · 15 of 47 positions shown, 18 images · non-contrast
Comparison: CT head [DATE]

CLINICAL DATA: Code stroke.  Headache.  Right-sided drift



[Series 3: head 5.0 st · axial · 0.44mm/px · z∈[-108,+32]mm · 9 of 34 slices shown, 12 images]
[im 3/34  brain]
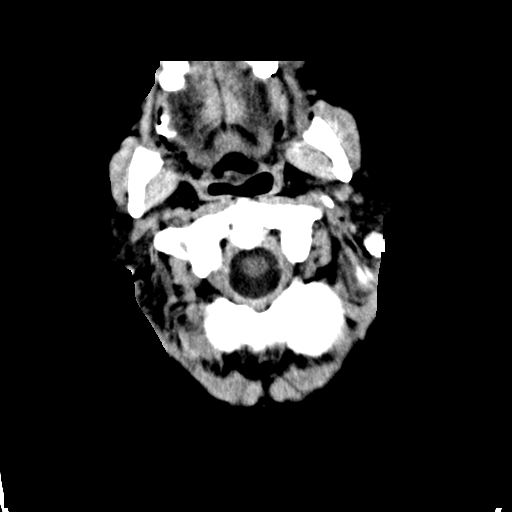
[im 3/34  bone]
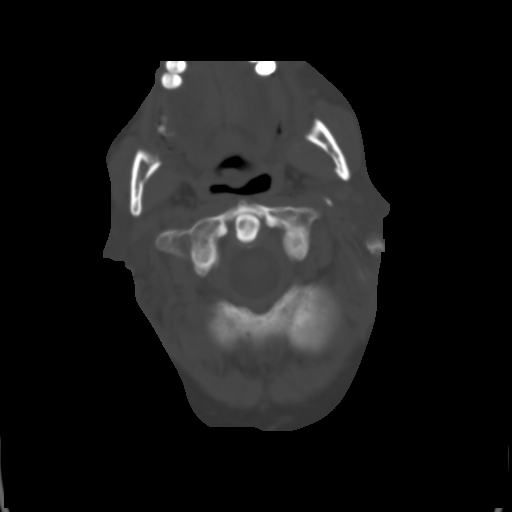
[im 6/34  brain]
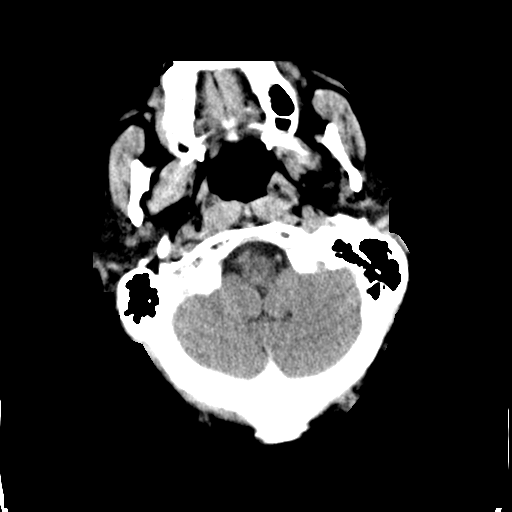
[im 10/34  brain]
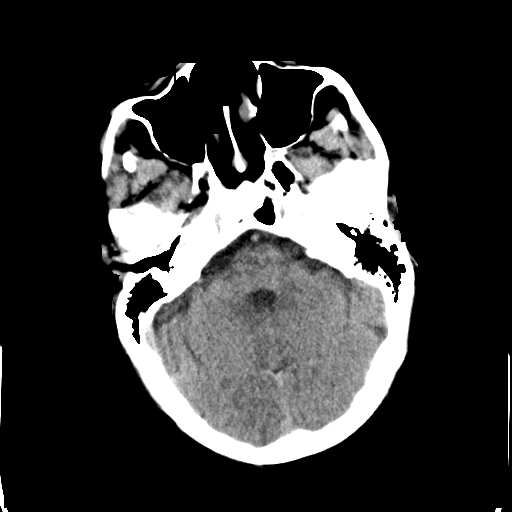
[im 13/34  brain]
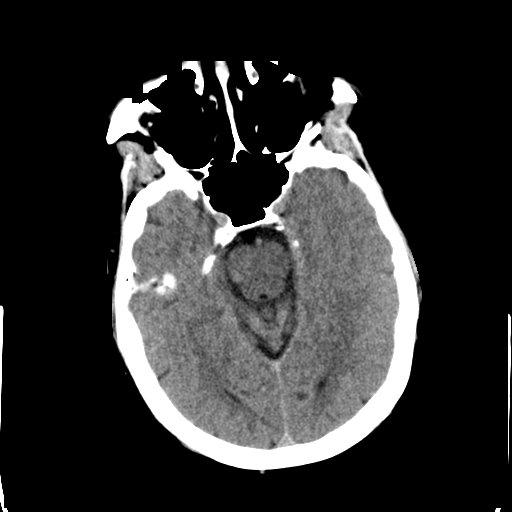
[im 18/34  brain]
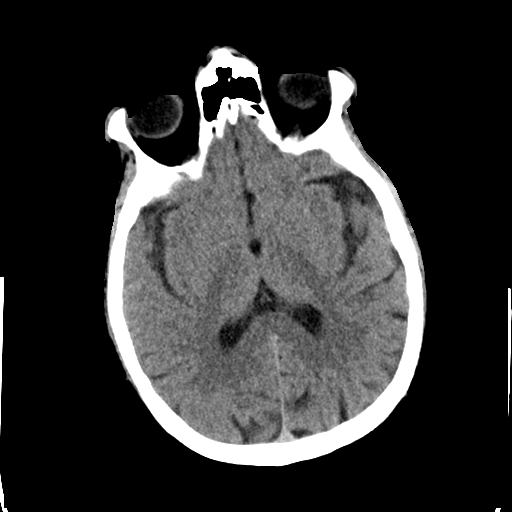
[im 18/34  bone]
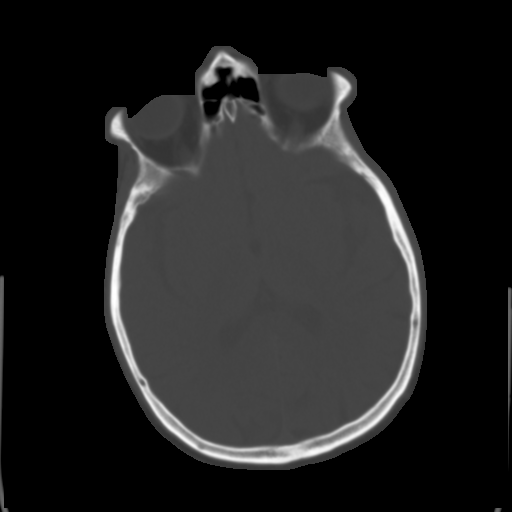
[im 21/34  brain]
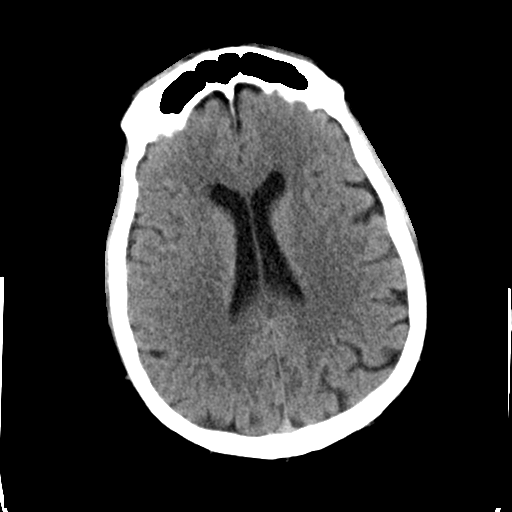
[im 24/34  brain]
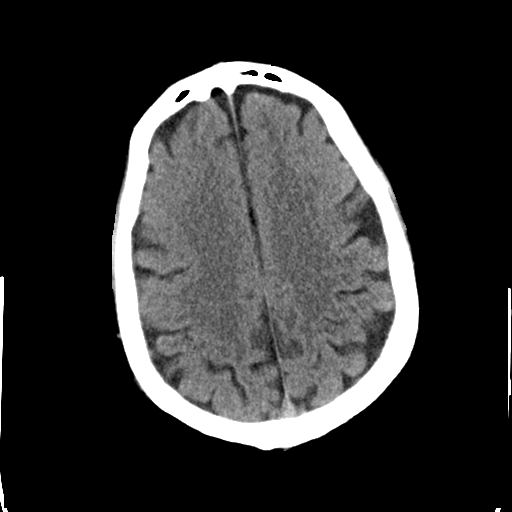
[im 28/34  brain]
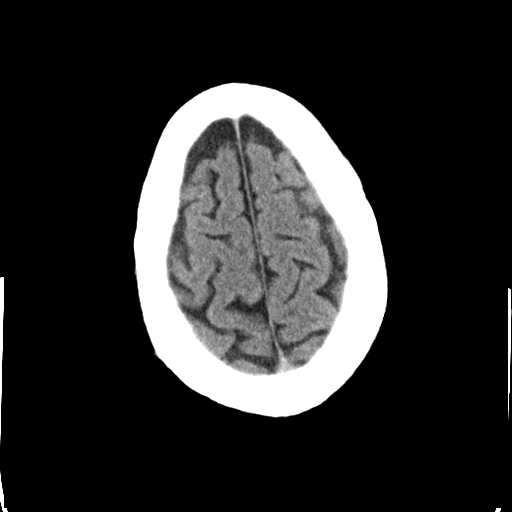
[im 31/34  brain]
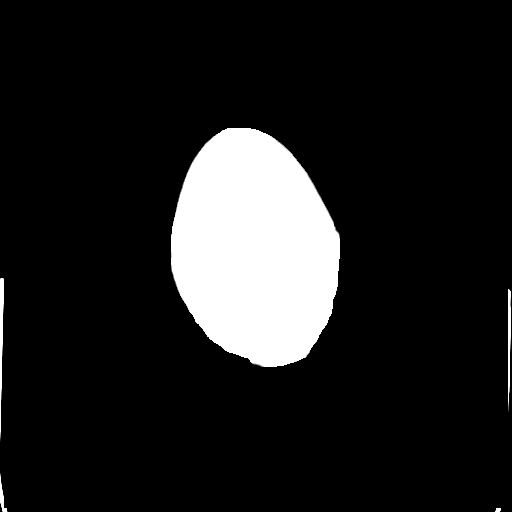
[im 31/34  bone]
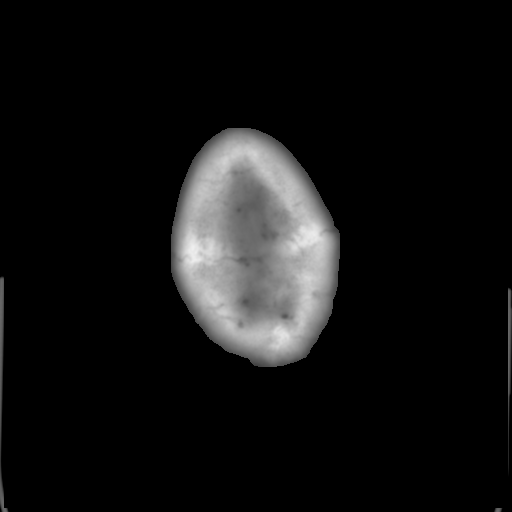

[Series 5: head 3.0 cor st · coronal · 0.35mm/px · 3 of 70 slices shown]
[im 24/70  brain]
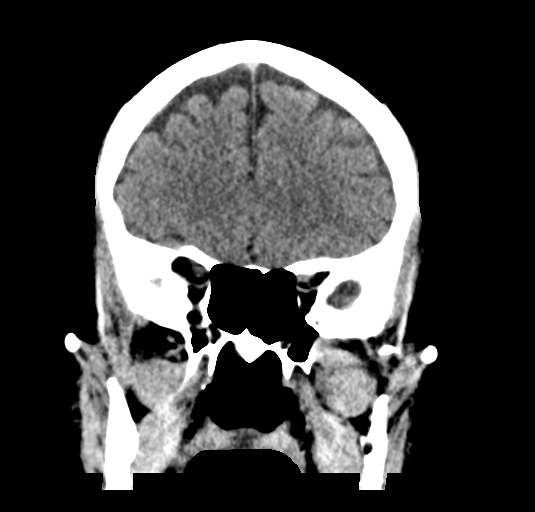
[im 31/70  brain]
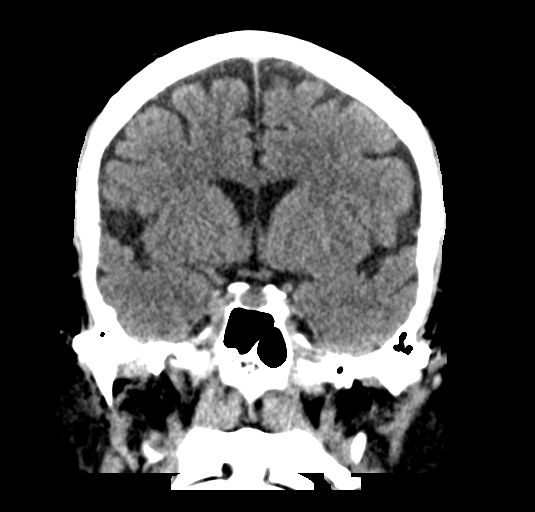
[im 39/70  brain]
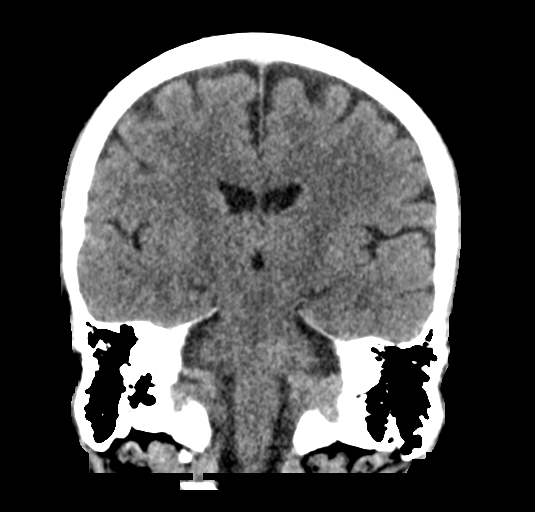

[Series 6: head 3.0 sag st · sagittal · 0.37mm/px · 3 of 53 slices shown]
[im 18/53  brain]
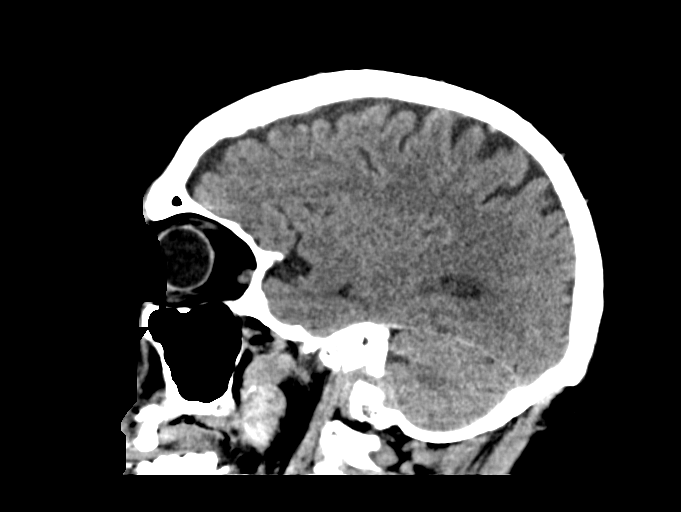
[im 27/53  brain]
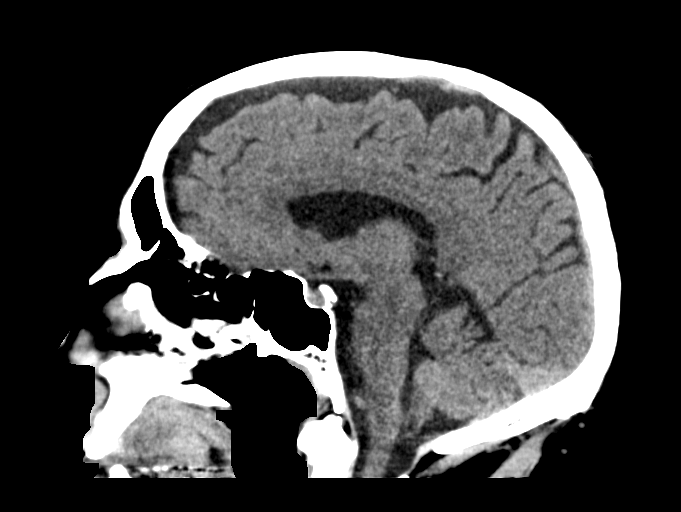
[im 35/53  brain]
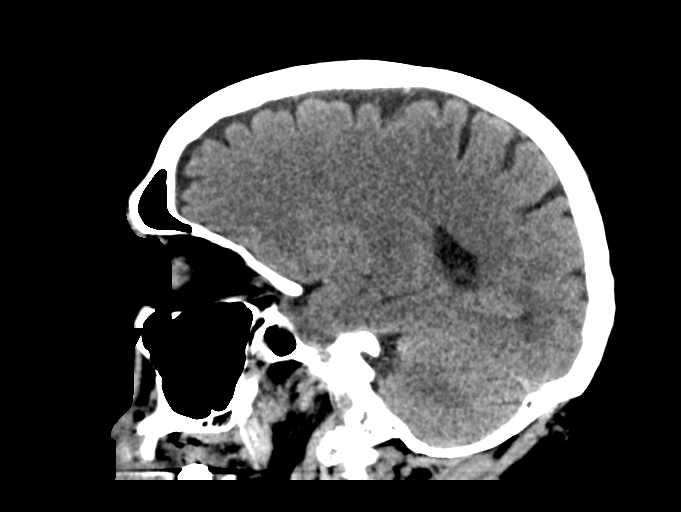

[15 of 47 positions shown; findings below may reference images not displayed]

FINDINGS: Brain: No evidence of acute infarction, hemorrhage, hydrocephalus,
extra-axial collection or mass lesion/mass effect.

Vascular: Negative for hyperdense vessel

Skull: Negative

Sinuses/Orbits: Paranasal sinuses clear. Bilateral cataract
extraction

Other: None

ASPECTS (Alberta Stroke Program Early CT Score)

- Ganglionic level infarction (caudate, lentiform nuclei, internal
capsule, insula, M1-M3 cortex): 7

- Supraganglionic infarction (M4-M6 cortex): 3

Total score (0-10 with 10 being normal): 10
IMPRESSION: 1. Negative CT head
2. ASPECTS is 10
pm to provider FABRICE NASIQUE via text page

## 2021-12-29 MED ORDER — ACETAMINOPHEN 500 MG PO TABS
1000.0000 mg | ORAL_TABLET | Freq: Once | ORAL | Status: AC
Start: 2021-12-29 — End: 2021-12-29
  Administered 2021-12-29: 1000 mg via ORAL
  Filled 2021-12-29: qty 2

## 2021-12-29 MED ORDER — DOXYCYCLINE HYCLATE 100 MG PO CAPS: 100.0000 mg | ORAL_CAPSULE | Freq: Two times a day (BID) | ORAL | 0 refills | Status: AC

## 2021-12-29 NOTE — Code Documentation (Signed)
Ryan Nicholson is a 61 yr old male with PMH stroke. He is on plavix. He had a sudden onset of H/A and difficulty talking today at 1200. Pt arrived to [facility] at 1408. Code stroke activated at 1421. SRN met pt in CT antearea. Pt weighed. CBG and labs have been done prior. Airway has been cleared prior. Pt taken to CT at 1427. CTNC obtained. CT is negative for acute hemorrhage per Dr Cheral Marker. Pt states he does have chronic rt sided weakness, but that he is weaker now than his baseline. He has a swollen area on left chin which is causing dysarthria. NIHSS 3 for arm and leg drift and mild dysarthria. Pt taken tfor STAT MRI for (thrombolytic) treatment decision. Per Dr Cheral Marker, MRI negative for ischemia. Code stroke cancelled at 1526. ?

## 2021-12-29 NOTE — ED Notes (Signed)
Patient verbalizes understanding of discharge instructions. Opportunity for questioning and answers were provided. Pt discharged from ED. 

## 2021-12-29 NOTE — ED Triage Notes (Signed)
Pt arrived POV from home c/o right sided headache, weakness and difficulty speaking.  ?

## 2021-12-29 NOTE — Consult Note (Addendum)
?                    NEURO HOSPITALIST CONSULT NOTE  ? ?Requestig physician: Dr. Karene Fry ? ?Reason for Consult: Acute onset of dysphasia and worsened right sided weakness ? ?History obtained from:   Patient  ? ?HPI:                                                                                                                                         ? Ryan Nicholson is an 61 y.o. male with a PMHx of B12 deficiency, depression, hypercholesterolemia, post-herpetic neuralgia, migraine and stroke in 2020 with residual right sided weakness, who arrived POV from home c/o right sided headache, weakness and difficulty speaking. LKN was 1200, at which time he noticed acute onset of neurological symptoms.  ? ?Past Medical History:  ?Diagnosis Date  ? B12 deficiency   ? Depression   ? Dizziness   ? DJD (degenerative joint disease)   ? Gastric ulcer   ? hx of  ? GERD (gastroesophageal reflux disease)   ? History of kidney stones   ? Hypercholesteremia   ? Migraine   ? Pneumonia   ? Post herpetic neuralgia   ? Stroke [facility])   ? mild 2020 (weak right side), short term memory loss  ? ? ?Past Surgical History:  ?Procedure Laterality Date  ? BACK SURGERY  09/1999, 01/2001  ? CHOLECYSTECTOMY  2005  ? COLON SURGERY    ? CYSTOSCOPY W/ URETERAL STENT PLACEMENT Left 02/04/2020  ? Procedure: CYSTOSCOPY WITH RETROGRADE PYELOGRAM/URETERAL STENT PLACEMENT;  Surgeon: Bjorn Pippin, MD;  Location: [facility];  Service: Urology;  Laterality: Left;  ? EXTRACORPOREAL SHOCK WAVE LITHOTRIPSY Left 03/03/2020  ? Procedure: EXTRACORPOREAL SHOCK WAVE LITHOTRIPSY (ESWL);  Surgeon: Ihor Gully, MD;  Location: [facility];  Service: Urology;  Laterality: Left;  ? EYE SURGERY Right   ? cataract removal  ? 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 HEMORRHOID SURGERY  2010  ? HERNIA REPAIR    ? KNEE SURGERY  1980, 1985, 2015  ? left shoulder surgery  2007  ? LUNG SURGERY Left 2002  ? PROSTATE SURGERY    ? SPINAL CORD STIMULATOR REMOVAL N/A 01/02/2020  ? Procedure:  Spinal cord stimulator removal  Lumbar;  Surgeon: Tia Alert, MD;  Location: [facility] OR;  Service: Neurosurgery;  Laterality: N/A;  ? SPINE SURGERY    ? spinal stimulator  ? TONSILECTOMY/ADENOIDECTOMY WITH MYRINGOTOMY    ? TONSILLECTOMY    ? ? ?Family History  ?Problem Relation Age of Onset  ? Breast cancer Mother   ? Diabetes Father   ? Hypertension Father   ? Stroke Father   ? Parkinson's disease Father   ? Brain cancer Maternal Uncle   ?         ? ?Social History:  reports that he quit smoking about 20  years ago. He has never used smokeless tobacco. He reports that he does not drink alcohol and does not use drugs. ? ?Allergies  ?Allergen Reactions  ? Oxycontin [Oxycodone] Other (See Comments)  ?  Unknown  ? Reglan [Metoclopramide] Nausea Only  ?  shakey  ? ? ?MEDICATIONS:                                                                                                                     ?No current facility-administered medications on file prior to encounter.  ? ?Current Outpatient Medications on File Prior to Encounter  ?Medication Sig Dispense Refill  ? albuterol (PROVENTIL HFA;VENTOLIN HFA) 108 (90 Base) MCG/ACT inhaler Inhale 2 puffs into the lungs every 4 (four) hours as needed for wheezing or shortness of breath.    ? amoxicillin (AMOXIL) 500 MG tablet Take 2 tablets (1,000 mg total) by mouth 2 (two) times daily. 28 tablet 0  ? atorvastatin (LIPITOR) 40 MG tablet Take 40 mg by mouth at bedtime.     ? baclofen (LIORESAL) 10 MG tablet Take 10 mg by mouth daily as needed for muscle spasms.     ? clopidogrel (PLAVIX) 75 MG tablet Take 75 mg by mouth daily.    ? cyanocobalamin (,VITAMIN B-12,) 1000 MCG/ML injection Inject 1 mL (1,000 mcg total) into the muscle every 30 (thirty) days. 3 mL 1  ? fluticasone (FLONASE) 50 MCG/ACT nasal spray Place 2 sprays into both nostrils daily. 16 g 11  ? Hyoscyamine Sulfate SL (LEVSIN/SL) 0.125 MG SUBL Place 0.125 mg under the tongue every 6 (six) hours as needed. 30 tablet 1  ?  meloxicam (MOBIC) 15 MG tablet Take 15 mg by mouth daily as needed for pain.     ? montelukast (SINGULAIR) 10 MG tablet Take 10 mg by mouth daily.     ? NUCYNTA 75 MG tablet Take 75 mg by mouth every 6 (six) hours as needed for moderate pain or severe pain.     ? ondansetron (ZOFRAN) 8 MG tablet Take 1 tablet (8 mg total) by mouth every 8 (eight) hours as needed for nausea or vomiting. 20 tablet 1  ? sertraline (ZOLOFT) 100 MG tablet Take 100 mg by mouth daily.    ? tamsulosin (FLOMAX) 0.4 MG CAPS capsule Take 1 capsule (0.4 mg total) by mouth daily after supper. 30 capsule 0  ? Tapentadol HCl (NUCYNTA) 100 MG TABS Take 1 tablet (100 mg total) by mouth in the morning, at noon, in the evening, and at bedtime. 30 tablet 0  ? testosterone cypionate (DEPOTESTOSTERONE CYPIONATE) 200 MG/ML injection Inject 200 mg into the muscle every 30 (thirty) days.    ? topiramate (TOPAMAX) 100 MG tablet Take 100 mg by mouth at bedtime.    ? traZODone (DESYREL) 100 MG tablet Take 100 mg by mouth at bedtime.     ? UBRELVY 100 MG TABS Take 1 tablet by mouth as directed. Take 1 tablet (100 mg) up to BID A WEEK FOR MIGRAINE    ? ? ? ? ?  ROS:                                                                                                                                       ?As per HPI. Detailed ROS deferred due to acuity of presentation.  ? ? ?Blood pressure 113/69, pulse 70, temperature 99.1 ?F (37.3 ?C), temperature source Oral, resp. rate 16, weight 71.1 kg, SpO2 99 %. ? ? ?General Examination:                                                                                                      ? ?Physical Exam  ?HEENT-  [place]/AT. Left cheek and lip swelling around a skin abrasion (cut self shaving) is noted.    ?Lungs- Respirations unlabored ?Extremities- No edema ? ?Neurological Examination ?Mental Status: ?Alert, oriented x 5. Anxious appearing. Speech is fluent with intact naming and comprehension. Able to follow complex commands. No  dysarthria.  ?Cranial Nerves: ?II: Visual fields intact bilaterally. No extinction to DSS. PERRL.  ?III,IV, VI: No ptosis. EOMI. No nystagmus.  ?V: Temp sensation equal bilaterally ?VII: Smile symmetric ?VIII: Hearing intact to voice ?IX,X: Mild hypophonia ?XI: Symmetric shoulder shrug ?XII: Midline tongue extension ?Motor: ?RUE 4 to 4+/5 proximally and distally  ?RLE 4 to 4+/5 proximally and distally ?There is drift of RUE and RLE when held antigravity ?LUE and LLE 5/5 ?Sensory: Light touch intact throughout, bilaterally. No extinction to DSS.  ?Deep Tendon Reflexes: 1+ and symmetric throughout ?Plantars: Right: downgoing  Left: downgoing ?Cerebellar: No ataxia with FNF bilaterally  ?Gait: Deferred ?  ?Lab Results: ?Basic Metabolic Panel: ?No results for input(s): NA, K, CL, CO2, GLUCOSE, BUN, CREATININE, CALCIUM, MG, PHOS in the last 168 hours. ? ?CBC: ?No results for input(s): WBC, NEUTROABS, HGB, HCT, MCV, PLT in the last 168 hours. ? ?Cardiac Enzymes: ?No results for input(s): CKTOTAL, CKMB, CKMBINDEX, TROPONINI in the last 168 hours. ? ?Lipid Panel: ?No results for input(s): CHOL, TRIG, HDL, CHOLHDL, VLDL, LDLCALC in the last 168 hours. ? ?Imaging: ?CT HEAD CODE STROKE WO CONTRAST` ? ?Result Date: 12/29/2021 ?CLINICAL DATA:  Code stroke.  Headache.  Right-sided drift EXAM: CT HEAD WITHOUT CONTRAST TECHNIQUE: Contiguous axial images were obtained from the base of the skull through the vertex without intravenous contrast. RADIATION DOSE REDUCTION: This exam was performed according to the departmental dose-optimization program which includes automated exposure control, adjustment of the mA and/or kV according to patient size and/or use of iterative reconstruction technique. COMPARISON:  CT head 05/17/2019 FINDINGS: Brain: No evidence of acute infarction, hemorrhage, hydrocephalus, extra-axial collection or mass lesion/mass effect. Vascular: Negative for hyperdense vessel Skull: Negative Sinuses/Orbits: Paranasal  sinuses clear. Bilateral cataract extraction Other: None ASPECTS (Alberta Stroke Program Early CT Score) - Ganglionic level infarction (caudate, lentiform nuclei, internal capsule, insula, M1-M3 cortex): 7

## 2021-12-29 NOTE — Discharge Instructions (Addendum)
You were evaluated in the Emergency Department and after careful evaluation, we did not find any emergent condition requiring admission or further testing in the hospital. ? ?Your exam/testing today was overall reassuring. Your stroke workup was negative for acute stroke. Symptoms of headache resolved with Tylenol. Recommend follow-up with neurology outpatient. Additionally, you have evidence of left sided facial cellulitis, for which we will start you on doxycycline. Return to the ED for worsening of symptoms despite outpatient antibiotics.  ? ?Please return to the Emergency Department if you experience any worsening of your condition.  Thank you for allowing [place] to be a part of your care. ? ?

## 2021-12-29 NOTE — ED Provider Notes (Signed)
?[facility] ?Provider Note ? ? ?CSN: [id number] ?Arrival date & time: 12/29/21  1403 ? ?  ? ?History ? ?Chief Complaint  ?Patient presents with  ? Headache  ? Dizziness  ? Weakness  ? ? ?Ryan Nicholson is a 61 y.o. male. ? ? ?Headache ?Associated symptoms: dizziness and weakness   ?Dizziness ?Associated symptoms: headaches and weakness   ?Weakness ?Associated symptoms: dizziness and headaches   ? ? 61 year old male with medical history significant ofr HLD, post herpetic neuralgia, migraine headaches, prior CVA in 2020 with residual deficits, who presents to the ED as a code stroke due to acute onset worsening of his known deficits. The patient states that his last normal was around noon. He noticed an acute onset of worsening weakness.  ? ?Additionally, he states that he cut himself shaving on the left side of his face within the past few days and has developed redness and swelling along his left lip/cheek. He denies any difficulty swallowing or phonating. No fevers or chills. No tongue elevation or tonsillar enlargement.  ? ?Home Medications ?Prior to Admission medications   ?Medication Sig Start Date End Date Taking? Authorizing Provider  ?doxycycline (VIBRAMYCIN) 100 MG capsule Take 1 capsule (100 mg total) by mouth 2 (two) times daily for 7 days. 12/29/21 01/05/22 Yes Ernie AvenaLawsing, Deonna Krummel, MD  ?albuterol (PROVENTIL HFA;VENTOLIN HFA) 108 (90 Base) MCG/ACT inhaler Inhale 2 puffs into the lungs every 4 (four) hours as needed for wheezing or shortness of breath.    [provider]  ?amoxicillin (AMOXIL) 500 MG tablet Take 2 tablets (1,000 mg total) by mouth 2 (two) times daily. 08/03/20   Melene PlanFloyd, Dan, DO  ?atorvastatin (LIPITOR) 40 MG tablet Take 40 mg by mouth at bedtime.  05/18/19   [provider]  ?baclofen (LIORESAL) 10 MG tablet Take 10 mg by mouth daily as needed for muscle spasms.     [provider]  ?clopidogrel (PLAVIX) 75 MG tablet Take 75 mg by mouth  daily.    [provider]  ?cyanocobalamin (,VITAMIN B-12,) 1000 MCG/ML injection Inject 1 mL (1,000 mcg total) into the muscle every 30 (thirty) days. 06/03/17   Shade FloodGreene, Jeffrey R, MD  ?fluticasone Aleda Grana(FLONASE) 50 MCG/ACT nasal spray Place 2 sprays into both nostrils daily. 06/03/17   Shade FloodGreene, Jeffrey R, MD  ?Hyoscyamine Sulfate SL (LEVSIN/SL) 0.125 MG SUBL Place 0.125 mg under the tongue every 6 (six) hours as needed. 02/05/20   Bjorn PippinWrenn, John, MD  ?meloxicam (MOBIC) 15 MG tablet Take 15 mg by mouth daily as needed for pain.  01/01/20   [provider]  ?montelukast (SINGULAIR) 10 MG tablet Take 10 mg by mouth daily.  06/07/19   [provider]  ?NUCYNTA 75 MG tablet Take 75 mg by mouth every 6 (six) hours as needed for moderate pain or severe pain.  01/09/20   [provider]  ?ondansetron (ZOFRAN) 8 MG tablet Take 1 tablet (8 mg total) by mouth every 8 (eight) hours as needed for nausea or vomiting. 06/03/17   Shade FloodGreene, Jeffrey R, MD  ?sertraline (ZOLOFT) 100 MG tablet Take 100 mg by mouth daily.    [provider]  ?tamsulosin (FLOMAX) 0.4 MG CAPS capsule Take 1 capsule (0.4 mg total) by mouth daily after supper. 03/03/20   Ihor Gullyttelin, Mark, MD  ?Tapentadol HCl (NUCYNTA) 100 MG TABS Take 1 tablet (100 mg total) by mouth in the morning, at noon, in the evening, and at bedtime. 01/03/20  Meyran, Tiana Loft, NP  ?testosterone cypionate (DEPOTESTOSTERONE CYPIONATE) 200 MG/ML injection Inject 200 mg into the muscle every 30 (thirty) days. 12/06/19   [provider]  ?topiramate (TOPAMAX) 100 MG tablet Take 100 mg by mouth at bedtime.    [provider]  ?traZODone (DESYREL) 100 MG tablet Take 100 mg by mouth at bedtime.  07/06/19   [provider]  ?UBRELVY 100 MG TABS Take 1 tablet by mouth as directed. Take 1 tablet (100 mg) up to BID A WEEK FOR MIGRAINE 01/13/20   [provider]  ?   ? ?Allergies    ?Oxycontin [oxycodone] and Reglan [metoclopramide]    ? ?Review of Systems   ?Review of Systems  ?Neurological:  Positive for dizziness, weakness and headaches.  ?All other systems reviewed and are negative. ? ?Physical Exam ?Updated Vital Signs ?BP (!) 105/57   Pulse 60   Temp 99.1 ?F (37.3 ?C) (Oral)   Resp 19   Wt 71.1 kg   SpO2 98%   BMI 24.55 kg/m?  ?Physical Exam ?Vitals and nursing note reviewed.  ?Constitutional:   ?   General: He is not in acute distress. ?HENT:  ?   Head: Normocephalic and atraumatic.  ?   Comments: Left sided facial cellulitis with erythema and induration near a healing abrasion ?Eyes:  ?   Conjunctiva/sclera: Conjunctivae normal.  ?   Pupils: Pupils are equal, round, and reactive to light.  ?Cardiovascular:  ?   Rate and Rhythm: Normal rate and regular rhythm.  ?Pulmonary:  ?   Effort: Pulmonary effort is normal. No respiratory distress.  ?Abdominal:  ?   General: There is no distension.  ?   Tenderness: There is no guarding.  ?Musculoskeletal:     ?   General: No deformity or signs of injury.  ?   Cervical back: Neck supple.  ?Skin: ?   Findings: No lesion or rash.  ?Neurological:  ?   General: No focal deficit present.  ?   Mental Status: He is alert and oriented to person, place, and time. Mental status is at baseline.  ?   GCS: GCS eye subscore is 4. GCS verbal subscore is 5. GCS motor subscore is 6.  ?   Cranial Nerves: Cranial nerves 2-12 are intact.  ?   Comments: 4/5 strength in the right hemibody, 5/5 strength in the left hemibody  ? ? ?ED Results / Procedures / Treatments   ?Labs ?(all labs ordered are listed, but only abnormal results are displayed) ?Labs Reviewed  ?COMPREHENSIVE METABOLIC PANEL - Abnormal; Notable for the following components:  ?    Result Value  ? Glucose, Bld 100 (*)   ? All other components within normal limits  ?URINALYSIS, ROUTINE W REFLEX MICROSCOPIC - Abnormal; Notable for the following components:  ? Color, Urine AMBER (*)   ? APPearance CLOUDY (*)   ? Ketones, ur 5 (*)   ? All other components  within normal limits  ?RESP PANEL BY RT-PCR (FLU A&B, COVID) ARPGX2  ?ETHANOL  ?PROTIME-INR  ?APTT  ?CBC  ?DIFFERENTIAL  ?RAPID URINE DRUG SCREEN, HOSP PERFORMED  ?CBG MONITORING, ED  ?I-STAT CHEM 8, ED  ? ? ?EKG ?EKG Interpretation ? ?Date/Time:  Tuesday December 29 2021 14:02:27 EDT ?Ventricular Rate:  65 ?PR Interval:  162 ?QRS Duration: 86 ?QT Interval:  392 ?QTC Calculation: 407 ?R Axis:   25 ?Text Interpretation: Normal sinus rhythm Normal ECG Confirmed by Cathren Laine ([id number]) on 12/30/2021 11:47:40 AM ? ?  Radiology ?MR BRAIN WO CONTRAST ? ?Result Date: 12/29/2021 ?CLINICAL DATA:  Suspect stroke EXAM: MRI HEAD WITHOUT CONTRAST TECHNIQUE: Multiplanar, multiecho pulse sequences of the brain and surrounding structures were obtained without intravenous contrast. COMPARISON:  CT head 12/29/2021 FINDINGS: Brain: No acute infarction, hemorrhage, hydrocephalus, extra-axial collection or mass lesion. Few small hyperintensities in the frontal white matter bilaterally. Vascular: Normal arterial flow voids at the skull base. Skull and upper cervical spine: Negative Sinuses/Orbits: Paranasal sinuses clear. Negative orbit. Bilateral cataract extraction Other: None IMPRESSION: No acute intracranial abnormality. Mild white matter changes likely chronic microvascular ischemia. Electronically Signed   By: Marlan Palau M.D.   On: 12/29/2021 15:38  ? ?CT HEAD CODE STROKE WO CONTRAST` ? ?Result Date: 12/29/2021 ?CLINICAL DATA:  Code stroke.  Headache.  Right-sided drift EXAM: CT HEAD WITHOUT CONTRAST TECHNIQUE: Contiguous axial images were obtained from the base of the skull through the vertex without intravenous contrast. RADIATION DOSE REDUCTION: This exam was performed according to the departmental dose-optimization program which includes automated exposure control, adjustment of the mA and/or kV according to patient size and/or use of iterative reconstruction technique. COMPARISON:  CT head 05/17/2019 FINDINGS: Brain: No evidence of  acute infarction, hemorrhage, hydrocephalus, extra-axial collection or mass lesion/mass effect. Vascular: Negative for hyperdense vessel Skull: Negative Sinuses/Orbits: Paranasal sinuses clear. Bilateral cataract e

## 2022-03-04 ENCOUNTER — Telehealth: Payer: Self-pay

## 2022-03-04 NOTE — Telephone Encounter (Signed)
I'm happy to see him for headache management

## 2022-03-04 NOTE — Telephone Encounter (Signed)
We received an ED Referral for a hospital follow up back in April. When scheduled, patient was advised that he would be following up with his previous neurologist, Dr Pearlean Brownie, last seen in 2021. Upon calling with an appointment reminder for an upcoming appointment with Dr Pearlean Brownie next week, the patient wife advised that at her appointment with Dr Delena Bali in May, it had been discussed about possibly transferring his care.  The current referral for the patient from the ED is for hx of stroke, headaches. The patient is requesting to transfer his care from Dr Pearlean Brownie to Dr Delena Bali.  Please advise if this is acceptable.

## 2022-03-09 NOTE — Telephone Encounter (Signed)
Spoke to pt's wife today on the status of scheduling this appt. Advised I would sent message to referral team.

## 2022-03-11 ENCOUNTER — Institutional Professional Consult (permissible substitution): Payer: Medicare Other | Admitting: Neurology

## 2022-03-15 NOTE — Progress Notes (Unsigned)
Referring:  Ernie Avena, MD [address] [place],  [place] [address]  PCP: Frederich Chick., MD  Neurology was asked to evaluate Ryan Nicholson, a 61 year old male for a chief complaint of headaches.  Our recommendations of care will be communicated by shared medical record.    CC:  headaches  History provided from self, wife Ryan Nicholson  HPI:  Medical co-morbidities: TIA, migraines, nephrolithiasis, postherpetic neuralgia, chronic back pain s/p spinal cord stimulator  The patient presents for evaluation of headaches which have been present for several years. They are described as pain in either temple which are associated with photophobia, phonophobia, nausea, and dizziness. He will also have flashing lights in his vision and weakness with his most severe headaches. He currently has headaches 2-3 times per week. Ruel Favors which does help, but he runs out of pills each month (currently gets 9 per month). He takes Topamax for migraine prevention. Has been on this for several years and is not sure how helpful it is. He does have a history of kidney stones.  Of note, he had transient right sided weakness in August 2020. CTH was negative, and he was unable to get MRI at the time due to spinal stimulator. This has since been removed and he underwent MRI brain 12/2021 which showed only mild white matter changes. He takes Plavix and atorvastatin for stroke prevention.  He is worried that his memory has been worsening over time. States dementia runs in his family.  Headache History: Onset: several years Triggers: stress Aura: flashing lights, weakness Location: temple Associated Symptoms:  Photophobia: yes  Phonophobia: yes  Nausea: yes Other symptoms: dizziness Worse with activity?: yes Duration of headaches: 24 hours without medicine, 20-30 minutes with Ubrelvy  Headache days per month: 9 Headache free days per month: 21  Current Treatment: Abortive Ubrelvy 100 mg  PRN  Preventative Topamax 100 mg QHS  Prior Therapies                                 Topamax 100 mg QHS - nephrolithiasis Zoloft 100 mg daily Imitrex 100 mg PRN Ubrelvy 100 mg PRN Baclofen 10 mg PRN  LABS: CBC    Component Value Date/Time   WBC 10.1 12/29/2021 1450   RBC 4.47 12/29/2021 1450   HGB 13.9 12/29/2021 1450   HCT 42.2 12/29/2021 1450   PLT 153 12/29/2021 1450   MCV 94.4 12/29/2021 1450   MCH 31.1 12/29/2021 1450   MCHC 32.9 12/29/2021 1450   RDW 13.5 12/29/2021 1450   LYMPHSABS 1.2 12/29/2021 1450   MONOABS 1.0 12/29/2021 1450   EOSABS 0.0 12/29/2021 1450   BASOSABS 0.1 12/29/2021 1450      Latest Ref Rng & Units 12/29/2021    2:50 PM 08/03/2020    3:17 PM 02/05/2020    3:50 AM  CMP  Glucose 70 - 99 mg/dL 301  98  601   BUN 8 - 23 mg/dL 14  15  19    Creatinine 0.61 - 1.24 mg/dL  0.93  2.35   Sodium 135 - 145 mmol/L 137  139  138   Potassium 3.5 - 5.1 mmol/L 4.2  4.4  4.8   Chloride 98 - 111 mmol/L 107  109  109   CO2 22 - 32 mmol/L 24  24  22    Calcium 8.9 - 10.3 mg/dL 9.2  9.0  8.7   Total  Protein 6.5 - 8.1 g/dL 6.9     Total Bilirubin 0.3 - 1.2 mg/dL 0.8     Alkaline Phos 38 - 126 U/L 62     AST 15 - 41 U/L 27     ALT 0 - 44 U/L 16        IMAGING:  MRI brain 12/29/21: No acute intracranial abnormality. Mild white matter changes likely chronic microvascular ischemia.  Imaging independently reviewed on March 16, 2022   Current Outpatient Medications on File Prior to Visit  Medication Sig Dispense Refill   acyclovir (ZOVIRAX) 400 MG tablet Take 400 mg by mouth 3 (three) times daily as needed.     albuterol (PROVENTIL HFA;VENTOLIN HFA) 108 (90 Base) MCG/ACT inhaler Inhale 2 puffs into the lungs every 4 (four) hours as needed for wheezing or shortness of breath.     amoxicillin (AMOXIL) 500 MG tablet Take 2 tablets (1,000 mg total) by mouth 2 (two) times daily. 28 tablet 0   atorvastatin (LIPITOR) 40 MG tablet Take 40 mg by mouth at bedtime.       baclofen (LIORESAL) 10 MG tablet Take 10 mg by mouth daily as needed for muscle spasms.      clopidogrel (PLAVIX) 75 MG tablet Take 75 mg by mouth daily.     cyanocobalamin (,VITAMIN B-12,) 1000 MCG/ML injection Inject 1 mL (1,000 mcg total) into the muscle every 30 (thirty) days. 3 mL 1   fluticasone (FLONASE) 50 MCG/ACT nasal spray Place 2 sprays into both nostrils daily. 16 g 11   Hyoscyamine Sulfate SL (LEVSIN/SL) 0.125 MG SUBL Place 0.125 mg under the tongue every 6 (six) hours as needed. 30 tablet 1   meloxicam (MOBIC) 15 MG tablet Take 15 mg by mouth daily as needed for pain.      montelukast (SINGULAIR) 10 MG tablet Take 10 mg by mouth daily.      omeprazole (PRILOSEC) 40 MG capsule Take 40 mg by mouth 2 (two) times daily.     ondansetron (ZOFRAN) 8 MG tablet Take 1 tablet (8 mg total) by mouth every 8 (eight) hours as needed for nausea or vomiting. 20 tablet 1   sertraline (ZOLOFT) 100 MG tablet Take 100 mg by mouth daily.     sucralfate (CARAFATE) 1 GM/10ML suspension SMARTSIG:Milliliter(s) By Mouth     Tapentadol HCl (NUCYNTA) 100 MG TABS Take 1 tablet (100 mg total) by mouth in the morning, at noon, in the evening, and at bedtime. 30 tablet 0   testosterone cypionate (DEPOTESTOSTERONE CYPIONATE) 200 MG/ML injection Inject 200 mg into the muscle every 30 (thirty) days.     topiramate (TOPAMAX) 100 MG tablet Take 100 mg by mouth at bedtime.     traZODone (DESYREL) 100 MG tablet Take 100 mg by mouth at bedtime.      UBRELVY 100 MG TABS Take 1 tablet by mouth as directed. Take 1 tablet (100 mg) up to BID A WEEK FOR MIGRAINE     NUCYNTA 75 MG tablet Take 75 mg by mouth every 6 (six) hours as needed for moderate pain or severe pain.  (Patient not taking: Reported on 03/16/2022)     tamsulosin (FLOMAX) 0.4 MG CAPS capsule Take 1 capsule (0.4 mg total) by mouth daily after supper. (Patient not taking: Reported on 03/16/2022) 30 capsule 0   No current facility-administered medications on file  prior to visit.     Allergies: Allergies  Allergen Reactions   Oxycontin [Oxycodone] Other (See Comments)    Unknown  Reglan [Metoclopramide] Nausea Only    shakey    Family History: Family History  Problem Relation Age of Onset   Breast cancer Mother    Diabetes Father    Hypertension Father    Stroke Father    Parkinson's disease Father    Brain cancer Maternal Uncle      Past Medical History: Past Medical History:  Diagnosis Date   B12 deficiency    Depression    Dizziness    DJD (degenerative joint disease)    Gastric ulcer    hx of   GERD (gastroesophageal reflux disease)    History of kidney stones    Hypercholesteremia    Migraine    Pneumonia    Post herpetic neuralgia    Stroke (HCC)    mild 2020 (weak right side), short term memory loss    Past Surgical History Past Surgical History:  Procedure Laterality Date   BACK SURGERY  09/1999, 01/2001   CHOLECYSTECTOMY  2005   COLON SURGERY     CYSTOSCOPY W/ URETERAL STENT PLACEMENT Left 02/04/2020   Procedure: CYSTOSCOPY WITH RETROGRADE PYELOGRAM/URETERAL STENT PLACEMENT;  Surgeon: Bjorn Pippin, MD;  Location: [facility];  Service: Urology;  Laterality: Left;   EXTRACORPOREAL SHOCK WAVE LITHOTRIPSY Left 03/03/2020   Procedure: EXTRACORPOREAL SHOCK WAVE LITHOTRIPSY (ESWL);  Surgeon: Ihor Gully, MD;  Location: [facility];  Service: Urology;  Laterality: Left;   EYE SURGERY Right    cataract removal   GASTRIC BYPASS  03/2004   HEMORRHOID SURGERY  2010   HERNIA REPAIR     KNEE SURGERY  1980, 1985, 2015   left shoulder surgery  2007   LUNG SURGERY Left 2002   PROSTATE SURGERY     SPINAL CORD STIMULATOR REMOVAL N/A 01/02/2020   Procedure: Spinal cord stimulator removal  Lumbar;  Surgeon: Tia Alert, MD;  Location: [facility];  Service: Neurosurgery;  Laterality: N/A;   SPINE SURGERY     spinal stimulator   TONSILECTOMY/ADENOIDECTOMY WITH MYRINGOTOMY     TONSILLECTOMY      Social  History: Social History   Tobacco Use   Smoking status: Former    Types: Cigarettes    Quit date: 02/14/2001    Years since quitting: 21.0   Smokeless tobacco: Never  Vaping Use   Vaping Use: Never used  Substance Use Topics   Alcohol use: No   Drug use: No     ROS: Negative for fevers, chills. Positive for headaches. All other systems reviewed and negative unless stated otherwise in HPI.   Physical Exam:   Vital Signs: BP (!) 104/58   Pulse 72   Ht 5\' 7"  (1.702 m)   Wt 161 lb (73 kg)   BMI 25.22 kg/m  GENERAL: well appearing,in no acute distress,alert SKIN:  Color, texture, turgor normal. No rashes or lesions HEAD:  Normocephalic/atraumatic. CV:  RRR RESP: Normal respiratory effort MSK: no tenderness to palpation over occiput, neck, or shoulders  NEUROLOGICAL: Mental Status: Alert, oriented to person, place and time,Follows commands    03/16/2022    3:00 PM  MMSE - Mini Mental State Exam  Orientation to time 5  Orientation to Place 5  Registration 3  Attention/ Calculation 3  Recall 2  Language- name 2 objects 2  Language- repeat 1  Language- follow 3 step command 3  Language- read & follow direction 1  Write a sentence 1  Copy design 1  Total score 27   Cranial Nerves: PERRL, visual  fields intact to confrontation, extraocular movements intact, facial sensation intact, no facial droop or ptosis, hearing grossly intact, no dysarthria Motor: muscle strength 5/5 both upper and lower extremities Reflexes: 2+ throughout Sensation: intact to light touch all 4 extremities Coordination: Finger-to- nose-finger intact bilaterally Gait: normal-based   IMPRESSION: 61 year old male with a history of TIA, migraines, nephrolithiasis, postherpetic neuralgia, chronic back pain s/p spinal cord stimulator who presents for evaluation of migraines and weakness. 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 Would like to transition him off of Topamax due to  lack of efficacy and history of kidney stones. Preventive medication options are limited due to his comorbidities. Would avoid beta blocker or blood pressure medications due to baseline hypotension and low heart rate. Cannot take additional antidepressants as he is already on Zoloft. Will start Emgality for migraine prevention. Loading dose given in office today. Will check B12 and TSH as he reports some worsening short term memory loss.  PLAN: -TSH, B12 levels -Prevention: Start Emgality 120 mg monthly. Loading dose given in office today -Rescue: Continue Ubrelvy 100 mg PRN   I spent a total of 44 minutes chart reviewing and counseling the patient. Headache education was done. Discussed treatment options including preventive and acute medications.  Discussed medication side effects, adverse reactions and drug interactions. Written educational materials and patient instructions outlining all of the above were given.  Follow-up: 6 months   Ocie Doyne, MD 03/16/2022   4:10 PM

## 2022-03-16 ENCOUNTER — Ambulatory Visit: Payer: Medicare Other | Admitting: Psychiatry

## 2022-03-16 ENCOUNTER — Encounter: Payer: Self-pay | Admitting: Psychiatry

## 2022-03-16 VITALS — BP 104/58 | HR 72 | Ht 67.0 in | Wt 161.0 lb

## 2022-03-16 DIAGNOSIS — G43119 Migraine with aura, intractable, without status migrainosus: Secondary | ICD-10-CM | POA: Diagnosis not present

## 2022-03-16 DIAGNOSIS — R413 Other amnesia: Secondary | ICD-10-CM | POA: Diagnosis not present

## 2022-03-16 MED ORDER — UBRELVY 100 MG PO TABS: 100.0000 mg | ORAL_TABLET | ORAL | 6 refills | Status: DC | PRN

## 2022-03-16 MED ORDER — EMGALITY 120 MG/ML ~~LOC~~ SOAJ: 120.0000 mg | SUBCUTANEOUS | 6 refills | Status: DC

## 2022-03-16 MED ORDER — EMGALITY 120 MG/ML ~~LOC~~ SOAJ: 2.0000 | Freq: Once | SUBCUTANEOUS | 0 refills | Status: AC

## 2022-03-16 NOTE — Patient Instructions (Addendum)
Start Emgality monthly injections for migraine prevention. Continue Topamax for now  Blood work today

## 2022-03-17 LAB — VITAMIN B12: Vitamin B-12: 667 pg/mL (ref 232–1245)

## 2022-03-17 LAB — TSH: TSH: 0.44 u[IU]/mL — ABNORMAL LOW (ref 0.450–4.500)

## 2022-04-21 ENCOUNTER — Telehealth: Payer: Self-pay | Admitting: Psychiatry

## 2022-04-21 NOTE — Telephone Encounter (Signed)
error 

## 2022-04-28 ENCOUNTER — Telehealth: Payer: Self-pay | Admitting: *Deleted

## 2022-04-28 NOTE — Telephone Encounter (Signed)
Emgality PA, key [id number]. Your information has been sent to [facility].

## 2022-04-28 NOTE — Telephone Encounter (Signed)
Request Reference Number: [id number]. EMGALITY INJ 120MG /ML is approved through 09/26/2022.

## 2022-04-29 ENCOUNTER — Encounter: Payer: Self-pay | Admitting: Psychiatry

## 2022-04-29 ENCOUNTER — Ambulatory Visit: Payer: Medicare Other | Admitting: Psychiatry

## 2022-04-29 VITALS — BP 113/68 | HR 66 | Ht 67.0 in | Wt 157.4 lb

## 2022-04-29 DIAGNOSIS — R2689 Other abnormalities of gait and mobility: Secondary | ICD-10-CM | POA: Diagnosis not present

## 2022-04-29 DIAGNOSIS — G43119 Migraine with aura, intractable, without status migrainosus: Secondary | ICD-10-CM | POA: Diagnosis not present

## 2022-04-29 MED ORDER — TOPIRAMATE 25 MG PO TABS: ORAL_TABLET | ORAL | 0 refills | Status: DC

## 2022-04-29 NOTE — Patient Instructions (Addendum)
Decrease Topamax to 75 mg at bedtime (3 pills) for one week, then to 50 mg (2 pills) for one week, then 25 mg (1 pill) for one week, then stop   Guidelines for the Management of Orthostatic intolerance  1. Make all postural changes from lying to sitting or sitting to standing, slowly.  2. Drink to 2.0 -2.5 L of fluids per day.  3. Increase sodium in the diet to 3 - 5 g per day.  4. Avoid large meals which can cause low blood pressure during digestion. It is better to eat smaller meals more often than three large meals.  5. Avoid alcohol. Alcohol and cause blood to pool in the legs which may worsen low blood pressure reactions when standing.  6. Perform lower extremity exercises to improve strength of the leg muscles. This will help prevent blood from a pooling in the legs when standing and walking.  7. Use custom fitted elastic support stockings. These will reduce a tendency for blood to pool in the legs when standing and may improve orthostatic intolerance.  8. Use physical counter maneuvers such as leg crossing, squatting, or raising and resting the leg on a chair. These maneuvers increase blood pressure and can improve orthostatic intolerance.

## 2022-04-29 NOTE — Progress Notes (Signed)
CC:  headaches  Follow-up Visit  Last visit: 03/16/22  Brief HPI: 61 year old male with a history of TIA, gastric bypass, gastric ulcer, migraines, nephrolithiasis, postherpetic neuralgia, chronic back pain s/p spinal cord stimulator who presents for follow up of migraines. MRI brain 12/27/21 was unremarkable other than mild chronic microvascular ischemia. CTA head/neck 04/2019 showed no significant vessel stenosis.  At his last visit he was started on Emgality for migraine prevention. Bernita Raisin was continued for rescue.  Interval History: His headaches have improved since starting Emgality. Has only had a couple of headaches in the past month which resolve with [place]. He continues to take Topamax 100 mg QHS.  He also reports lightheadedness and dizziness which has been present over the past 3 years. No clear triggers. Sometimes wakes up with it. Can last for 30 minutes to hours at a time. Denies vertigo, but it will sometimes feel like he is wobbly like he is "wearing new glasses". Feels like sometimes he will be in a "trance-like state" and like everything around him is tuned out. Sometimes will stumble when he is walking and feels imbalanced in the shower (has  a shower rail). No recent falls. Saw ENT 12/2020 for these symptoms who suspected orthostatic hypotension. Takes meclizine but this is not particularly helpful.  Headache days per month: 2 Headache free days per month: 28  Current Headache Regimen: Preventative: Emgality 120 mg monthly, Topamax 100 mg QHS Abortive: Ubrelvy 100 mg PRN   Prior Therapies                                  Topamax 100 mg QHS - nephrolithiasis Emgality 120 mg monthly Zoloft 100 mg daily Imitrex 100 mg PRN Ubrelvy 100 mg PRN Baclofen 10 mg PRN  Physical Exam:   Vital Signs: BP 113/68   Pulse 66   Ht 5\' 7"  (1.702 m)   Wt 157 lb 6.4 oz (71.4 kg)   BMI 24.65 kg/m  GENERAL:  well appearing, in no acute distress, alert  SKIN:  Color, texture,  turgor normal. No rashes or lesions HEAD:  Normocephalic/atraumatic. RESP: normal respiratory effort MSK:  No gross joint deformities.   NEUROLOGICAL: Mental Status: Alert, oriented to person, place and time, Follows commands, and Speech fluent and appropriate. Cranial Nerves: PERRL, face symmetric, no dysarthria, hearing grossly intact Motor: moves all extremities equally Sensation: decreased sensation in bilateral feet to pinprick and vibration (states this is baseline from prior back surgery) Gait: normal-based.  IMPRESSION: 61 year old male with a history of TIA, migraines, nephrolithiasis, postherpetic neuralgia, gastric bypass, chronic back pain s/p spinal cord stimulator who presents for follow up of migraines. He has had significant improvement in his headaches with Emgality. His dizziness and imbalance are likely multifactorial due to low blood pressure, decreased sensation in his feet from prior nerve damage, and possibly from his medications. Will wean off of Topamax and see if that helps reduce his dizziness. Discussed conservative measures to reduce orthostatic intolerance including compression stockings, hydration, and physical counter maneuvers while standing (leg crossing, squatting).  PLAN: -Prevention: Continue Emgality 120 mg monthly. Wean Topamax--decrease by 25 mg weekly until off. -Rescue: Continue Ubrelvy 100 mg PRN -Information for measures to reduce orthostatic intolerance provided  Follow-up: 3-4 months  I spent a total of 44 minutes on the date of the service. Headache education was done. Discussed treatment options including preventive and acute medications. Discussed  medication side effects, adverse reactions and drug interactions. Written educational materials and patient instructions outlining all of the above were given.  Ocie Doyne, MD 04/29/22 12:01 PM

## 2022-09-02 NOTE — Progress Notes (Signed)
Referring:  Frederich Chick., MD [address] [place],  [place] [address]  PCP: Frederich Chick., MD  Neurology was asked to evaluate Ryan Nicholson, a 61 year old male for a chief complaint of headaches.  Our recommendations of care will be communicated by shared medical record.    CC:  headaches Chief Complaint  Patient presents with   Follow-up    Pt with wife, rm 2. Pt is following up for migraines. He states that the migraines used to be 3 times a week and now down to 2-3 a month     History provided from self, wife Ryan Nicholson   Follow-up Visit   Last visit: 04/29/2022 Dr. Delena Bali   Brief HPI: 61 year old male with a history of TIA, gastric bypass, gastric ulcer, migraines, nephrolithiasis, postherpetic neuralgia, chronic back pain s/p spinal cord stimulator who presents for follow up of migraines. MRI brain 12/27/21 was unremarkable other than mild chronic microvascular ischemia. CTA head/neck 04/2019 showed no significant vessel stenosis.   At his last visit, Emgality was continued and topiramate discontinued   Interval History: Headaches remain well-controlled on Emgality.  He has since completely stopped topiramate without any worsening of migraines.  Currently experiencing 2-3 migraine days per month, typically resolves with Ubrelvy. Does c/o gradual worsening vision, plans on scheduling f/u with ophthalmology.      Headache days per month: 2-3 Headache free days per month: 27-28   Current Headache Regimen: Preventative: Emgality 120 mg monthly Abortive: Ubrelvy 100 mg PRN     Prior Therapies                                  Topamax 100 mg QHS - nephrolithiasis Emgality 120 mg monthly Zoloft 100 mg daily Imitrex 100 mg PRN Ubrelvy 100 mg PRN Baclofen 10 mg PRN  LABS: CBC    Component Value Date/Time   WBC 10.1 12/29/2021 1450   RBC 4.47 12/29/2021 1450   HGB 13.9 12/29/2021 1450   HCT 42.2 12/29/2021 1450   PLT 153 12/29/2021 1450   MCV 94.4 12/29/2021  1450   MCH 31.1 12/29/2021 1450   MCHC 32.9 12/29/2021 1450   RDW 13.5 12/29/2021 1450   LYMPHSABS 1.2 12/29/2021 1450   MONOABS 1.0 12/29/2021 1450   EOSABS 0.0 12/29/2021 1450   BASOSABS 0.1 12/29/2021 1450      Latest Ref Rng & Units 12/29/2021    2:50 PM 08/03/2020    3:17 PM 02/05/2020    3:50 AM  CMP  Glucose 70 - 99 mg/dL 469  98  629   BUN 8 - 23 mg/dL 14  15  19    Creatinine 0.61 - 1.24 mg/dL  5.28  4.13   Sodium 135 - 145 mmol/L 137  139  138   Potassium 3.5 - 5.1 mmol/L 4.2  4.4  4.8   Chloride 98 - 111 mmol/L 107  109  109   CO2 22 - 32 mmol/L 24  24  22    Calcium 8.9 - 10.3 mg/dL 9.2  9.0  8.7   Total Protein 6.5 - 8.1 g/dL 6.9     Total Bilirubin 0.3 - 1.2 mg/dL 0.8     Alkaline Phos 38 - 126 U/L 62     AST 15 - 41 U/L 27     ALT 0 - 44 U/L 16  IMAGING:  MRI brain 12/29/21: No acute intracranial abnormality. Mild white matter changes likely chronic microvascular ischemia.  Imaging independently reviewed on September 06, 2022   Current Outpatient Medications on File Prior to Visit  Medication Sig Dispense Refill   acyclovir (ZOVIRAX) 400 MG tablet Take 400 mg by mouth daily.     albuterol (PROVENTIL HFA;VENTOLIN HFA) 108 (90 Base) MCG/ACT inhaler Inhale 2 puffs into the lungs every 4 (four) hours as needed for wheezing or shortness of breath.     atorvastatin (LIPITOR) 40 MG tablet Take 40 mg by mouth at bedtime.      baclofen (LIORESAL) 10 MG tablet Take 10 mg by mouth daily as needed for muscle spasms.      clopidogrel (PLAVIX) 75 MG tablet Take 75 mg by mouth daily.     cyanocobalamin (,VITAMIN B-12,) 1000 MCG/ML injection Inject 1 mL (1,000 mcg total) into the muscle every 30 (thirty) days. 3 mL 1   fluticasone (FLONASE) 50 MCG/ACT nasal spray Place 2 sprays into both nostrils daily. 16 g 11   meloxicam (MOBIC) 15 MG tablet Take 15 mg by mouth daily as needed for pain.      montelukast (SINGULAIR) 10 MG tablet Take 10 mg by mouth daily.       omeprazole (PRILOSEC) 40 MG capsule Take 40 mg by mouth daily.     ondansetron (ZOFRAN) 8 MG tablet Take 1 tablet (8 mg total) by mouth every 8 (eight) hours as needed for nausea or vomiting. 20 tablet 1   sertraline (ZOLOFT) 100 MG tablet Take 100 mg by mouth daily.     sucralfate (CARAFATE) 1 GM/10ML suspension Take 1 g by mouth 4 (four) times daily.     Tapentadol HCl (NUCYNTA) 100 MG TABS Take 1 tablet (100 mg total) by mouth in the morning, at noon, in the evening, and at bedtime. 30 tablet 0   testosterone cypionate (DEPOTESTOSTERONE CYPIONATE) 200 MG/ML injection Inject 200 mg into the muscle every 30 (thirty) days.     traZODone (DESYREL) 100 MG tablet Take 100 mg by mouth at bedtime.      RESTASIS 0.05 % ophthalmic emulsion Place 1 drop into both eyes 2 (two) times daily.     No current facility-administered medications on file prior to visit.     Allergies: Allergies  Allergen Reactions   Oxycontin [Oxycodone] Other (See Comments)    Unknown   Reglan [Metoclopramide] Nausea Only    shakey    Family History: Family History  Problem Relation Age of Onset   Breast cancer Mother    Diabetes Father    Hypertension Father    Stroke Father    Parkinson's disease Father    Brain cancer Maternal Uncle      Past Medical History: Past Medical History:  Diagnosis Date   B12 deficiency    Depression    Dizziness    DJD (degenerative joint disease)    Gastric ulcer    hx of   GERD (gastroesophageal reflux disease)    History of kidney stones    Hypercholesteremia    Migraine    Pneumonia    Post herpetic neuralgia    Stroke ([facility])    mild 2020 (weak right side), short term memory loss    Past Surgical History Past Surgical History:  Procedure Laterality Date   BACK SURGERY  09/1999, 01/2001   CHOLECYSTECTOMY  2005   COLON SURGERY     CYSTOSCOPY W/ URETERAL STENT PLACEMENT Left 02/04/2020  Procedure: CYSTOSCOPY WITH RETROGRADE PYELOGRAM/URETERAL STENT PLACEMENT;   Surgeon: Bjorn Pippin, MD;  Location: [facility];  Service: Urology;  Laterality: Left;   EXTRACORPOREAL SHOCK WAVE LITHOTRIPSY Left 03/03/2020   Procedure: EXTRACORPOREAL SHOCK WAVE LITHOTRIPSY (ESWL);  Surgeon: Ihor Gully, MD;  Location: [facility];  Service: Urology;  Laterality: Left;   EYE SURGERY Right    cataract removal   GASTRIC BYPASS  03/2004   HEMORRHOID SURGERY  2010   HERNIA REPAIR     KNEE SURGERY  1980, 1985, 2015   left shoulder surgery  2007   LUNG SURGERY Left 2002   PROSTATE SURGERY     SPINAL CORD STIMULATOR REMOVAL N/A 01/02/2020   Procedure: Spinal cord stimulator removal  Lumbar;  Surgeon: Tia Alert, MD;  Location: [facility];  Service: Neurosurgery;  Laterality: N/A;   SPINE SURGERY     spinal stimulator   TONSILECTOMY/ADENOIDECTOMY WITH MYRINGOTOMY     TONSILLECTOMY      Social History: Social History   Tobacco Use   Smoking status: Former    Types: Cigarettes    Quit date: 02/14/2001    Years since quitting: 21.5   Smokeless tobacco: Never  Vaping Use   Vaping Use: Never used  Substance Use Topics   Alcohol use: No   Drug use: No     ROS: Negative for fevers, chills. Positive for headaches. All other systems reviewed and negative unless stated otherwise in HPI.   Physical Exam:   Vital Signs: BP 116/71   Pulse 72   Ht 5\' 7"  (1.702 m)   Wt 155 lb (70.3 kg)   BMI 24.28 kg/m  GENERAL: well appearing,in no acute distress,alert SKIN:  Color, texture, turgor normal. No rashes or lesions HEAD:  Normocephalic/atraumatic. CV:  RRR RESP: Normal respiratory effort MSK: no tenderness to palpation over occiput, neck, or shoulders  NEUROLOGICAL: Mental Status: Alert, oriented to person, place and time,Follows commands    03/16/2022    3:00 PM  MMSE - Mini Mental State Exam  Orientation to time 5  Orientation to Place 5  Registration 3  Attention/ Calculation 3  Recall 2  Language- name 2 objects 2  Language- repeat 1   Language- follow 3 step command 3  Language- read & follow direction 1  Write a sentence 1  Copy design 1  Total score 27   Cranial Nerves: PERRL, visual fields intact to confrontation, extraocular movements intact, facial sensation intact, no facial droop or ptosis, hearing grossly intact, no dysarthria Motor: muscle strength 5/5 both upper and lower extremities Reflexes: 2+ throughout Sensation: intact to light touch all 4 extremities Coordination: Finger-to- nose-finger intact bilaterally Gait: normal-based     IMPRESSION: 61 year old male with a history of TIA, migraines, nephrolithiasis, postherpetic neuralgia, gastric bypass, chronic back pain s/p spinal cord stimulator who presents for follow up of migraines, initially seen by Dr. 68 03/16/2022. He has had significant improvement in his headaches with Emgality.     PLAN: -Prevention: Continue Emgality 120 mg monthly.  -Rescue: Continue Ubrelvy 100 mg PRN, advised can repeat after 2 hrs if needed    Follow up in 9 months or call earlier if needed     I spent 23 minutes of face-to-face and non-face-to-face time with patient and wife.  This included previsit chart review, lab review, study review, order entry, electronic health record documentation, patient education and discussion regarding migraine headaches and treatment plan and answered all other questions to patient and  wife's satisfaction   Ihor Austin, AGNP-BC  [facility] [address] [place], [place] [address]  Phone [phone] Fax [phone] Note: This document was prepared with digital dictation and possible smart phrase technology. Any transcriptional errors that result from this process are unintentional.

## 2022-09-06 ENCOUNTER — Ambulatory Visit: Payer: Medicare Other | Admitting: Adult Health

## 2022-09-06 ENCOUNTER — Encounter: Payer: Self-pay | Admitting: Adult Health

## 2022-09-06 VITALS — BP 116/71 | HR 72 | Ht 67.0 in | Wt 155.0 lb

## 2022-09-06 DIAGNOSIS — G43119 Migraine with aura, intractable, without status migrainosus: Secondary | ICD-10-CM

## 2022-09-06 MED ORDER — UBRELVY 100 MG PO TABS: 100.0000 mg | ORAL_TABLET | ORAL | 11 refills | Status: DC | PRN

## 2022-09-06 MED ORDER — EMGALITY 120 MG/ML ~~LOC~~ SOAJ: 120.0000 mg | SUBCUTANEOUS | 11 refills | Status: DC

## 2022-09-06 NOTE — Patient Instructions (Signed)
Your Plan:  Continue Emgality monthly injection  Continue Ubrelvy for rescue medication    Follow up in 9 months or call earlier if needed     Thank you for coming to see [place] at [facility]. I hope we have been able to provide you high quality care today.  You may receive a patient satisfaction survey over the next few weeks. We would appreciate your feedback and comments so that we may continue to improve ourselves and the health of our patients.

## 2022-09-22 ENCOUNTER — Ambulatory Visit: Payer: Medicare Other | Admitting: Psychiatry

## 2022-10-07 NOTE — Progress Notes (Signed)
Referring:  Sherald Hess., MD [place],  [place] [address]  PCP: Sherald Hess., MD  Neurology was asked to evaluate Ryan Nicholson, a 62 year old male for a chief complaint of headaches.  Our recommendations of care will be communicated by shared medical record.    CC:  headaches Chief Complaint  Patient presents with   Follow-up    Rm 3, alone.  6-7 times this month.  R sharp shooting pain, turns into migraine. Not like L sided migraines.  Takes Roselyn Meier helps, taking emgality.      History provided from self, wife via telephone    Follow-up Visit   Last visit: 09/06/2022   Brief HPI: 62 year old male with a history of TIA, gastric bypass, migraines, nephrolithiasis, postherpetic neuralgia, chronic back pain s/p spinal cord stimulator who presents for follow up of migraines. MRI brain 12/27/21 was unremarkable other than mild chronic microvascular ischemia. CTA head/neck 04/2019 showed no significant vessel stenosis.   At his last visit, he was doing well with only 2-3 migraine days per month and Emgality and Ubrelvy continued    Interval History:  Returns for sooner scheduled visit due to worsening headaches.  His chronic migraines typically occur on left side but over the past month, he has been experiencing migraine headaches on the right side progressively worsening. Starts with sharp stabbing type pain over right temporal and behind eye, can be associated with right facial numbness and facial droop and sometimes blurred vision, and then will proceed into migraine headache. Denies any actual asymmetric weakness but does feel generally weak prior to onset of migraine.  Roselyn Meier helps on occasion but not consistent, has been using 4-5x per week over the past month. Over the past couple of weeks, he has been experiencing these more on a daily basis.  Reports continued usage of Emgality monthly, has not experienced any recent left-sided migraines.       Headache days per month: 20-30 Headache free days per month: 0-10   Current Headache Regimen: Preventative: Emgality 120 mg monthly Abortive: Ubrelvy 100 mg PRN     Prior Therapies                                  Topamax 100 mg QHS  Emgality 120 mg monthly Zoloft 100 mg daily Imitrex 100 mg PRN Ubrelvy 100 mg PRN Baclofen 10 mg PRN    LABS: CBC    Component Value Date/Time   WBC 10.1 12/29/2021 1450   RBC 4.47 12/29/2021 1450   HGB 13.9 12/29/2021 1450   HCT 42.2 12/29/2021 1450   PLT 153 12/29/2021 1450   MCV 94.4 12/29/2021 1450   MCH 31.1 12/29/2021 1450   MCHC 32.9 12/29/2021 1450   RDW 13.5 12/29/2021 1450   LYMPHSABS 1.2 12/29/2021 1450   MONOABS 1.0 12/29/2021 1450   EOSABS 0.0 12/29/2021 1450   BASOSABS 0.1 12/29/2021 1450      Latest Ref Rng & Units 12/29/2021    2:50 PM 08/03/2020    3:17 PM 02/05/2020    3:50 AM  CMP  Glucose 70 - 99 mg/dL 100  98  228   BUN 8 - 23 mg/dL 14  15  19    Creatinine 0.61 - 1.24 mg/dL 1.00  1.10  1.03   Sodium 135 - 145 mmol/L 137  139  138   Potassium 3.5 - 5.1  mmol/L 4.2  4.4  4.8   Chloride 98 - 111 mmol/L 107  109  109   CO2 22 - 32 mmol/L 24  24  22    Calcium 8.9 - 10.3 mg/dL 9.2  9.0  8.7   Total Protein 6.5 - 8.1 g/dL 6.9     Total Bilirubin 0.3 - 1.2 mg/dL 0.8     Alkaline Phos 38 - 126 U/L 62     AST 15 - 41 U/L 27     ALT 0 - 44 U/L 16        IMAGING:  MRI brain 12/29/21: No acute intracranial abnormality. Mild white matter changes likely chronic microvascular ischemia.  Imaging independently reviewed on October 11, 2022   Current Outpatient Medications on File Prior to Visit  Medication Sig Dispense Refill   acyclovir (ZOVIRAX) 400 MG tablet Take 400 mg by mouth daily.     albuterol (PROVENTIL HFA;VENTOLIN HFA) 108 (90 Base) MCG/ACT inhaler Inhale 2 puffs into the lungs every 4 (four) hours as needed for wheezing or shortness of breath.     atorvastatin (LIPITOR) 40 MG tablet Take 40 mg by mouth at  bedtime.      baclofen (LIORESAL) 10 MG tablet Take 10 mg by mouth daily as needed for muscle spasms.      clopidogrel (PLAVIX) 75 MG tablet Take 75 mg by mouth daily.     cyanocobalamin (,VITAMIN B-12,) 1000 MCG/ML injection Inject 1 mL (1,000 mcg total) into the muscle every 30 (thirty) days. 3 mL 1   fluticasone (FLONASE) 50 MCG/ACT nasal spray Place 2 sprays into both nostrils daily. 16 g 11   Galcanezumab-gnlm (EMGALITY) 120 MG/ML SOAJ Inject 120 mg into the skin every 30 (thirty) days. 1.12 mL 11   meloxicam (MOBIC) 15 MG tablet Take 15 mg by mouth daily as needed for pain.      montelukast (SINGULAIR) 10 MG tablet Take 10 mg by mouth daily.      omeprazole (PRILOSEC) 40 MG capsule Take 40 mg by mouth daily.     ondansetron (ZOFRAN) 8 MG tablet Take 1 tablet (8 mg total) by mouth every 8 (eight) hours as needed for nausea or vomiting. 20 tablet 1   RESTASIS 0.05 % ophthalmic emulsion Place 1 drop into both eyes 2 (two) times daily.     sertraline (ZOLOFT) 100 MG tablet Take 100 mg by mouth daily.     sucralfate (CARAFATE) 1 GM/10ML suspension Take 1 g by mouth 4 (four) times daily.     Tapentadol HCl (NUCYNTA) 100 MG TABS Take 1 tablet (100 mg total) by mouth in the morning, at noon, in the evening, and at bedtime. 30 tablet 0   testosterone cypionate (DEPOTESTOSTERONE CYPIONATE) 200 MG/ML injection Inject 200 mg into the muscle every 30 (thirty) days.     traZODone (DESYREL) 100 MG tablet Take 100 mg by mouth at bedtime.      Ubrogepant (UBRELVY) 100 MG TABS Take 100 mg by mouth as needed. 16 tablet 11   No current facility-administered medications on file prior to visit.     Allergies: Allergies  Allergen Reactions   Oxycontin [Oxycodone] Other (See Comments)    Unknown   Reglan [Metoclopramide] Nausea Only    shakey    Family History: Family History  Problem Relation Age of Onset   Breast cancer Mother    Diabetes Father    Hypertension Father    Stroke Father     Parkinson's disease Father  Brain cancer Maternal Uncle      Past Medical History: Past Medical History:  Diagnosis Date   B12 deficiency    Depression    Dizziness    DJD (degenerative joint disease)    Gastric ulcer    hx of   GERD (gastroesophageal reflux disease)    History of kidney stones    Hypercholesteremia    Migraine    Pneumonia    Post herpetic neuralgia    Stroke ([facility])    mild 2020 (weak right side), short term memory loss    Past Surgical History Past Surgical History:  Procedure Laterality Date   BACK SURGERY  09/1999, 01/2001   CHOLECYSTECTOMY  2005   COLON SURGERY     CYSTOSCOPY W/ URETERAL STENT PLACEMENT Left 02/04/2020   Procedure: CYSTOSCOPY WITH RETROGRADE PYELOGRAM/URETERAL STENT PLACEMENT;  Surgeon: Irine Seal, MD;  Location: [facility];  Service: Urology;  Laterality: Left;   EXTRACORPOREAL SHOCK WAVE LITHOTRIPSY Left 03/03/2020   Procedure: EXTRACORPOREAL SHOCK WAVE LITHOTRIPSY (ESWL);  Surgeon: Kathie Rhodes, MD;  Location: [facility];  Service: Urology;  Laterality: Left;   EYE SURGERY Right    cataract removal   GASTRIC BYPASS  03/2004   HEMORRHOID SURGERY  2010   [place], 1985, 2015   left shoulder surgery  2007   LUNG SURGERY Left 2002   PROSTATE SURGERY     SPINAL CORD STIMULATOR REMOVAL N/A 01/02/2020   Procedure: Spinal cord stimulator removal  Lumbar;  Surgeon: Eustace Moore, MD;  Location: [facility];  Service: Neurosurgery;  Laterality: N/A;   SPINE SURGERY     spinal stimulator   TONSILECTOMY/ADENOIDECTOMY WITH MYRINGOTOMY     TONSILLECTOMY      Social History: Social History   Tobacco Use   Smoking status: Former    Types: Cigarettes    Quit date: 02/14/2001    Years since quitting: 21.6   Smokeless tobacco: Never  Vaping Use   Vaping Use: Never used  Substance Use Topics   Alcohol use: No   Drug use: No     ROS: Negative for fevers, chills. Positive for headaches. All other  systems reviewed and negative unless stated otherwise in HPI.   Physical Exam:   Vital Signs: BP 111/74   Pulse 95   Ht 5\' 7"  (1.702 m)   Wt 158 lb 12.8 oz (72 kg)   BMI 24.87 kg/m  GENERAL: well appearing,in no acute distress,alert SKIN:  Color, texture, turgor normal. No rashes or lesions HEAD:  Normocephalic/atraumatic.  No temporal pain with pressure CV:  RRR RESP: Normal respiratory effort MSK: no tenderness to palpation over occiput, neck, or shoulders  NEUROLOGICAL: Mental Status: Alert, oriented to person, place and time,Follows commands    03/16/2022    3:00 PM  MMSE - Mini Mental State Exam  Orientation to time 5  Orientation to Place 5  Registration 3  Attention/ Calculation 3  Recall 2  Language- name 2 objects 2  Language- repeat 1  Language- follow 3 step command 3  Language- read & follow direction 1  Write a sentence 1  Copy design 1  Total score 27   Cranial Nerves: PERRL, visual fields intact to confrontation, extraocular movements intact, facial sensation intact, no facial droop or ptosis, hearing grossly intact, no dysarthria Motor: muscle strength 5/5 both upper and lower extremities Reflexes: 2+ throughout Sensation: intact to light touch all 4 extremities Coordination: Finger-to- nose-finger  intact bilaterally Gait: normal-based     IMPRESSION: 62 year old male with a history of TIA, migraines, nephrolithiasis, postherpetic neuralgia, gastric bypass, chronic back pain s/p spinal cord stimulator who presents for follow up of migraines, initially seen by Dr. Delena Bali 03/16/2022. He has had significant improvement in his headaches with Emgality. Returns today with worsening migraines now on right side associated with transient right facial numbness and droop and blurred vision despite continued use of Emgality, question overuse of Ubrelvy potentially contributing.     PLAN: -Complete MRI brain due to change in headache characteristics, age and  progressively worsening migraine headaches  -obtain CRP and ESR to r/o temporal arteritis although low suspicion -Prevention: Continue Emgality 120 mg monthly.  Recommend restarting topiramate 25 mg twice daily for 1 week then increase to 50 mg twice daily.  Advised to call with any difficulty tolerating or after few weeks if no benefit -Rescue: recommend trialing Nurtec 75mg  PRN, if no benefit would recommend resuming Ubrelvy 100 mg PRN, advised can repeat after 2 hrs if needed, discussed limiting usage to 2-3 times per week due to potential rebound headache  -is on Plavix for hx of stroke, is requesting refill, advised these should be obtained by PCP as this is a life long medication for stroke prevention, short term refill provided but ongoing refills will need to be obtained by PCP. Prior use of omeprazole but not currently  using, if this type of mediation is needed in the future, okay to use pantoprazole as interaction between omeprazole and Plavix but will defer to PCP     Follow up in 2 months or call earlier if needed    I spent 26 minutes of face-to-face and non-face-to-face time with patient and wife.  This included previsit chart review, lab review, study review, order entry, electronic health record documentation, patient education and discussion regarding migraine headaches and treatment plan and answered all other questions to patient and wife's satisfaction   , AGNP-BC  [facility] [address] [place], [place]  Phone [phone] Fax [phone] Note: This document was prepared with digital dictation and possible smart phrase technology. Any transcriptional errors that result from this process are unintentional.

## 2022-10-11 ENCOUNTER — Encounter: Payer: Self-pay | Admitting: Adult Health

## 2022-10-11 ENCOUNTER — Ambulatory Visit: Payer: Medicare Other | Admitting: Adult Health

## 2022-10-11 VITALS — BP 111/74 | HR 95 | Ht 67.0 in | Wt 158.8 lb

## 2022-10-11 DIAGNOSIS — G43119 Migraine with aura, intractable, without status migrainosus: Secondary | ICD-10-CM | POA: Diagnosis not present

## 2022-10-11 DIAGNOSIS — R519 Headache, unspecified: Secondary | ICD-10-CM

## 2022-10-11 MED ORDER — TOPIRAMATE 25 MG PO TABS: ORAL_TABLET | ORAL | 3 refills | Status: DC

## 2022-10-11 MED ORDER — NURTEC 75 MG PO TBDP: 75.0000 mg | ORAL_TABLET | ORAL | 11 refills | Status: DC | PRN

## 2022-10-11 MED ORDER — CLOPIDOGREL BISULFATE 75 MG PO TABS: 75.0000 mg | ORAL_TABLET | Freq: Every day | ORAL | 0 refills | Status: DC

## 2022-10-11 NOTE — Patient Instructions (Addendum)
Your Plan:  Recommend completion of MRI brain due to new onset headaches   Will check lab work today  Recommend trialing Nurtec as rescue - please do not use more than 2-3 times per week   Recommend topamax 25mg  twice daily for 1 week than increase to 50mg  twice daily     Follow up in 2 months or call earlier if needed     Thank you for coming to see [place] at [facility]. I hope we have been able to provide you high quality care today.  You may receive a patient satisfaction survey over the next few weeks. We would appreciate your feedback and comments so that we may continue to improve ourselves and the health of our patients.

## 2022-10-12 ENCOUNTER — Telehealth: Payer: Self-pay | Admitting: Neurology

## 2022-10-12 ENCOUNTER — Telehealth: Payer: Self-pay | Admitting: Adult Health

## 2022-10-12 LAB — C-REACTIVE PROTEIN: CRP: <1 mg/L (ref 0–10)

## 2022-10-12 LAB — SEDIMENTATION RATE: Sed Rate: 5 mm/hr (ref 0–30)

## 2022-10-12 NOTE — Telephone Encounter (Signed)
-----  Message from Frann Rider, NP sent at 10/12/2022  7:20 AM EST ----- Please call patient - has not been able to use MyChart (last login 02/2022). Lab work from yesterday was within normal limits without evidence of temporal arteritis. We will continue treatment plan as discussed yesterday and proceed with MRI brain. Thank you.

## 2022-10-12 NOTE — Telephone Encounter (Signed)
[employer] medicare NPR sent to GI [phone]

## 2022-10-12 NOTE — Telephone Encounter (Signed)
Called the patient and reviewed the lab results with the patient. Advised that there was no evidence of temporal arteritis and that we would move forward with MRI. Pt verbalized understanding. Pt had no questions at this time but was encouraged to call back if questions arise.

## 2022-10-13 ENCOUNTER — Telehealth: Payer: Self-pay | Admitting: Neurology

## 2022-10-13 NOTE — Telephone Encounter (Signed)
PA completed on [facility] rx UOR:[id number] Will await response

## 2022-10-14 NOTE — Telephone Encounter (Signed)
Approved for the pt until 09/27/2023  Request Reference Number: [id number]. NURTEC TAB 75MG  ODT is approved through 09/27/2023. Your patient may now fill this prescription and it will be covered.

## 2022-10-28 ENCOUNTER — Ambulatory Visit
Admission: RE | Admit: 2022-10-28 | Discharge: 2022-10-28 | Disposition: A | Discharge status: Home / Self Care | Payer: Medicare Other | Source: Ambulatory Visit | Attending: Adult Health | Admitting: Adult Health

## 2022-10-28 DIAGNOSIS — R519 Headache, unspecified: Secondary | ICD-10-CM

## 2022-10-28 MED ORDER — GADOPICLENOL 0.5 MMOL/ML IV SOLN
7.0000 mL | Freq: Once | INTRAVENOUS | Status: AC | PRN
  Administered 2022-10-28: 7 mL via INTRAVENOUS

## 2022-12-09 NOTE — Progress Notes (Signed)
Referring:  Sherald Hess., MD [place],   [id number]  PCP: Sherald Hess., MD  Neurology was asked to evaluate Ryan Nicholson, a 62 year old male for a chief complaint of headaches.  Our recommendations of care will be communicated by shared medical record.    CC:  headaches Chief Complaint  Patient presents with   Follow-up    Pt alone, rm 3. He feels things are better. He noticed headaches have decreased. Dizziness and light headed feeling has also gotten better.     History provided from self   Follow-up Visit   Last visit: 09/06/2022   Brief HPI: 62 year old male with a history of TIA, gastric bypass, migraines, nephrolithiasis, postherpetic neuralgia, chronic back pain s/p spinal cord stimulator who presents for follow up of migraines. MRI brain 12/27/21 was unremarkable other than mild chronic microvascular ischemia. CTA head/neck 04/2019 showed no significant vessel stenosis.   At his last visit, he complained of worsening headaches now present on right side (previously on left) associated with facial numbness, facial droop and blurred vision and then will proceed to migraine headache.  Recommended completion of brain MRI, ESR and CRP, and initiated topiramate for preventative in addition to monthly Emgality injection and Nurtec for rescue.    Interval History:  Reports improvement of migraine headaches since prior visit currently experiencing about 3-4/month (previously 4-5x per week). Will use Nurtec with resolution of migraine.  Current use of topiramate 100 mg nightly and monthly Emgality injection.  He does mention after looking at the computer screen for period of time and turning it off, he will have some OD vision loss only lasting for several seconds and then resolved.  He does have chronic floaters in right eye but this has been present for years without any recent changes.  Denies any loss of vision at any other times.  He plans on  scheduling follow-up visit with ophthalmologist Dr. Bing Plume for further evaluation. MRI brain after prior visit unremarkable for acute findings.  Lab work negative for GCA.       Headache days per month: 3-4 Headache free days per month: 26-27   Current Headache Regimen: Preventative: Emgality 120 mg monthly.  Topiramate 100mg  nightly Abortive: Nurtec     Prior Therapies                                  Topamax 100 mg QHS  Emgality 120 mg monthly Zoloft 100 mg daily Imitrex 100 mg PRN Ubrelvy 100 mg PRN Baclofen 10 mg PRN    LABS: CBC    Component Value Date/Time   WBC 10.1 12/29/2021 1450   RBC 4.47 12/29/2021 1450   HGB 13.9 12/29/2021 1450   HCT 42.2 12/29/2021 1450   PLT 153 12/29/2021 1450   MCV 94.4 12/29/2021 1450   MCH 31.1 12/29/2021 1450   MCHC 32.9 12/29/2021 1450   RDW 13.5 12/29/2021 1450   LYMPHSABS 1.2 12/29/2021 1450   MONOABS 1.0 12/29/2021 1450   EOSABS 0.0 12/29/2021 1450   BASOSABS 0.1 12/29/2021 1450      Latest Ref Rng & Units 12/29/2021    2:50 PM 08/03/2020    3:17 PM 02/05/2020    3:50 AM  CMP  Glucose 70 - 99 mg/dL 100  98  228   BUN 8 - 23 mg/dL 14  15  19    Creatinine 0.61 -  1.24 mg/dL 1.00  1.10  1.03   Sodium 135 - 145 mmol/L 137  139  138   Potassium 3.5 - 5.1 mmol/L 4.2  4.4  4.8   Chloride 98 - 111 mmol/L 107  109  109   CO2 22 - 32 mmol/L 24  24  22    Calcium 8.9 - 10.3 mg/dL 9.2  9.0  8.7   Total Protein 6.5 - 8.1 g/dL 6.9     Total Bilirubin 0.3 - 1.2 mg/dL 0.8     Alkaline Phos 38 - 126 U/L 62     AST 15 - 41 U/L 27     ALT 0 - 44 U/L 16        IMAGING:  MRI brain 12/29/21: No acute intracranial abnormality. Mild white matter changes likely chronic microvascular ischemia.  Imaging independently reviewed on December 13, 2022   Current Outpatient Medications on File Prior to Visit  Medication Sig Dispense Refill   acyclovir (ZOVIRAX) 400 MG tablet Take 400 mg by mouth daily.     albuterol (PROVENTIL HFA;VENTOLIN HFA) 108  (90 Base) MCG/ACT inhaler Inhale 2 puffs into the lungs every 4 (four) hours as needed for wheezing or shortness of breath.     atorvastatin (LIPITOR) 40 MG tablet Take 40 mg by mouth at bedtime.      baclofen (LIORESAL) 10 MG tablet Take 10 mg by mouth daily as needed for muscle spasms.      clopidogrel (PLAVIX) 75 MG tablet Take 1 tablet (75 mg total) by mouth daily. 90 tablet 0   cyanocobalamin (,VITAMIN B-12,) 1000 MCG/ML injection Inject 1 mL (1,000 mcg total) into the muscle every 30 (thirty) days. 3 mL 1   fluticasone (FLONASE) 50 MCG/ACT nasal spray Place 2 sprays into both nostrils daily. 16 g 11   Galcanezumab-gnlm (EMGALITY) 120 MG/ML SOAJ Inject 120 mg into the skin every 30 (thirty) days. 1.12 mL 11   meloxicam (MOBIC) 15 MG tablet Take 15 mg by mouth daily as needed for pain.      montelukast (SINGULAIR) 10 MG tablet Take 10 mg by mouth daily.      ondansetron (ZOFRAN) 8 MG tablet Take 1 tablet (8 mg total) by mouth every 8 (eight) hours as needed for nausea or vomiting. 20 tablet 1   RESTASIS 0.05 % ophthalmic emulsion Place 1 drop into both eyes 2 (two) times daily.     Rimegepant Sulfate (NURTEC) 75 MG TBDP Take 75 mg by mouth as needed. 16 tablet 11   sertraline (ZOLOFT) 100 MG tablet Take 100 mg by mouth daily.     sucralfate (CARAFATE) 1 GM/10ML suspension Take 1 g by mouth in the morning, at noon, and at bedtime.     Tapentadol HCl (NUCYNTA) 100 MG TABS Take 1 tablet (100 mg total) by mouth in the morning, at noon, in the evening, and at bedtime. 30 tablet 0   testosterone cypionate (DEPOTESTOSTERONE CYPIONATE) 200 MG/ML injection Inject 200 mg into the muscle every 30 (thirty) days.     traZODone (DESYREL) 100 MG tablet Take 100 mg by mouth at bedtime.      No current facility-administered medications on file prior to visit.     Allergies: Allergies  Allergen Reactions   Oxycontin [Oxycodone] Other (See Comments)    Unknown   Reglan [Metoclopramide] Nausea Only     shakey    Family History: Family History  Problem Relation Age of Onset   Breast cancer Mother  Diabetes Father    Hypertension Father    Stroke Father    Parkinson's disease Father    Brain cancer Maternal Uncle      Past Medical History: Past Medical History:  Diagnosis Date   B12 deficiency    Depression    Dizziness    DJD (degenerative joint disease)    Gastric ulcer    hx of   GERD (gastroesophageal reflux disease)    History of kidney stones    Hypercholesteremia    Migraine    Pneumonia    Post herpetic neuralgia    Stroke ([place])    mild 2020 (weak right side), short term memory loss    Past Surgical History Past Surgical History:  Procedure Laterality Date   BACK SURGERY  09/1999, 01/2001   CHOLECYSTECTOMY  2005   COLON SURGERY     CYSTOSCOPY W/ URETERAL STENT PLACEMENT Left 02/04/2020   Procedure: CYSTOSCOPY WITH RETROGRADE PYELOGRAM/URETERAL STENT PLACEMENT;  Surgeon: Irine Seal, MD;  Location: [facility];  Service: Urology;  Laterality: Left;   EXTRACORPOREAL SHOCK WAVE LITHOTRIPSY Left 03/03/2020   Procedure: EXTRACORPOREAL SHOCK WAVE LITHOTRIPSY (ESWL);  Surgeon: Kathie Rhodes, MD;  Location: [facility];  Service: Urology;  Laterality: Left;   EYE SURGERY Right    cataract removal   GASTRIC BYPASS  03/2004   HEMORRHOID SURGERY  2010   [place], 1985, 2015   left shoulder surgery  2007   LUNG SURGERY Left 2002   PROSTATE SURGERY     SPINAL CORD STIMULATOR REMOVAL N/A 01/02/2020   Procedure: Spinal cord stimulator removal  Lumbar;  Surgeon: Eustace Moore, MD;  Location: [place];  Service: Neurosurgery;  Laterality: N/A;   SPINE SURGERY     spinal stimulator   TONSILECTOMY/ADENOIDECTOMY WITH MYRINGOTOMY     TONSILLECTOMY      Social History: Social History   Tobacco Use   Smoking status: Former    Types: Cigarettes    Quit date: 02/14/2001    Years since quitting: 21.8   Smokeless tobacco: Never   Vaping Use   Vaping Use: Never used  Substance Use Topics   Alcohol use: No   Drug use: No     ROS: Negative for fevers, chills. Positive for headaches. All other systems reviewed and negative unless stated otherwise in HPI.   Physical Exam:   Vital Signs: BP 105/63   Pulse 70   Ht 5\' 7"  (1.702 m)   Wt 162 lb (73.5 kg)   BMI 25.37 kg/m  GENERAL: well appearing,in no acute distress,alert SKIN:  Color, texture, turgor normal. No rashes or lesions HEAD:  Normocephalic/atraumatic.  No temporal pain with pressure CV:  RRR RESP: Normal respiratory effort MSK: no tenderness to palpation over occiput, neck, or shoulders  NEUROLOGICAL: Mental Status: Alert, oriented to person, place and time,Follows commands    03/16/2022    3:00 PM  MMSE - Mini Mental State Exam  Orientation to time 5  Orientation to Place 5  Registration 3  Attention/ Calculation 3  Recall 2  Language- name 2 objects 2  Language- repeat 1  Language- follow 3 step command 3  Language- read & follow direction 1  Write a sentence 1  Copy design 1  Total score 27   Cranial Nerves: PERRL, visual fields intact to confrontation, extraocular movements intact, facial sensation intact, no facial droop or ptosis, hearing grossly intact, no dysarthria Motor: muscle strength 5/5  both upper and lower extremities Reflexes: 2+ throughout Sensation: intact to light touch all 4 extremities Coordination: Finger-to- nose-finger intact bilaterally Gait: normal-based     IMPRESSION: 62 year old male with a history of TIA, migraines, nephrolithiasis, postherpetic neuralgia, gastric bypass, chronic back pain s/p spinal cord stimulator who presents for follow up of left-sided migraines, initially seen by Dr. Billey Gosling 03/16/2022. He has had significant improvement in his headaches with Emgality.  Reported onset of right sided migraine headaches associated with facial numbness/drooping and blurred vision around 09/2022, MRI brain  unremarkable for acute findings, ESR and CRP negative.  Restarted topiramate in addition to Emgality with improvement, currently experiencing 3-4 migraines per month and use of Nurtec with benefit. He does mention ongoing concern of occasional OD vision loss lasting only seconds after looking at computer screen and turning it off.      PLAN: -Prevention: Continue Emgality 120 mg monthly and topiramate 100 mg nightly.   -Rescue: Continue Nurtec 75mg  PRN -Advised to follow-up with ophthalmology regarding transient OD vision loss, if normal ophthalmology exam, could consider completing carotid ultrasound although does not have any of these symptoms outside of looking at computer screen so will hold off for now until further ophthalmology eval    Follow up in 6 months or call earlier if needed    I spent 25 minutes of face-to-face and non-face-to-face time with patient.  This included previsit chart review, lab review, study review, order entry, electronic health record documentation, patient education and discussion regarding the above and answered all other questions to patient's satisfaction  Frann Rider, [facility]  [facility] [address] [place], [place] [address]  Phone [phone] Fax [phone] Note: This document was prepared with digital dictation and possible smart phrase technology. Any transcriptional errors that result from this process are unintentional.

## 2022-12-13 ENCOUNTER — Encounter: Payer: Self-pay | Admitting: Adult Health

## 2022-12-13 ENCOUNTER — Ambulatory Visit: Payer: Medicare Other | Admitting: Adult Health

## 2022-12-13 VITALS — BP 105/63 | HR 70 | Ht 67.0 in | Wt 162.0 lb

## 2022-12-13 DIAGNOSIS — G43119 Migraine with aura, intractable, without status migrainosus: Secondary | ICD-10-CM | POA: Diagnosis not present

## 2022-12-13 MED ORDER — TOPIRAMATE 100 MG PO TABS: 100.0000 mg | ORAL_TABLET | Freq: Every day | ORAL | 3 refills | Status: DC

## 2022-12-13 NOTE — Patient Instructions (Addendum)
Your Plan:  Continue monthly Emgality injection and topiramate 100mg  nightly   Continue Nurtec as needed for abortive migraine therapy  Follow up with your eye doctor regarding vision concerns      Follow up in 6 months or call earlier if needed       Thank you for coming to see [place] at [facility]. I hope we have been able to provide you high quality care today.  You may receive a patient satisfaction survey over the next few weeks. We would appreciate your feedback and comments so that we may continue to improve ourselves and the health of our patients.

## 2023-01-06 ENCOUNTER — Other Ambulatory Visit: Payer: Self-pay

## 2023-01-06 MED ORDER — CLOPIDOGREL BISULFATE 75 MG PO TABS: 75.0000 mg | ORAL_TABLET | Freq: Every day | ORAL | 0 refills | Status: DC

## 2023-04-07 ENCOUNTER — Other Ambulatory Visit: Payer: Self-pay

## 2023-04-07 MED ORDER — CLOPIDOGREL BISULFATE 75 MG PO TABS
75.0000 mg | ORAL_TABLET | Freq: Every day | ORAL | 0 refills | Status: DC
Start: 2023-04-07 — End: 2023-07-04

## 2023-06-01 ENCOUNTER — Telehealth: Payer: Self-pay | Admitting: Neurology

## 2023-06-01 NOTE — Telephone Encounter (Signed)
Received a surgical clearance for for the patient from neurological standpoint for a total rt knee replacement.

## 2023-06-02 NOTE — Telephone Encounter (Signed)
Patient with history of stroke in 2021 and on Plavix for secondary stroke prevention, has been stable from a stroke standpoint since that time.  He is currently being followed in office for migraine management and plavix is currently managed by PCP. Okay to hold plavix from a neurological standpoint for 3-5 days prior to procedure with small but acceptable risk of recurrent stroke while off therapy, recommend restarting immediately after or once hemodynamically stable.

## 2023-06-02 NOTE — Telephone Encounter (Signed)
I have faxed the form to the surgical office per the number on the form including documentation. Awaiting confirmation that fax was received.

## 2023-06-08 NOTE — Progress Notes (Deleted)
Referring:  Frederich Chick., MD [address] [place],  [place] [address]  PCP: Frederich Chick., MD  Neurology was asked to evaluate Makhail Mula, a 62 year old male for a chief complaint of headaches.  Our recommendations of care will be communicated by shared medical record.    CC:  headaches No chief complaint on file.    History provided from self   Follow-up Visit   Last visit: 12/13/2022   Brief HPI: 62 year old male with a history of TIA, gastric bypass, migraines, nephrolithiasis, postherpetic neuralgia, chronic back pain s/p spinal cord stimulator who presents for follow up of migraines. MRI brain 12/27/21 was unremarkable other than mild chronic microvascular ischemia. CTA head/neck 04/2019 showed no significant vessel stenosis. C/o new onset right sided headaches 09/2022 (typically on left side) with right facial numbness, facial droop and blurred vision prior to migraine onset.  MRI brain unremarkable, ESR and CRP negative.    At his last visit, reported improvement of migraine headaches with only 3 to 4/month on Emgality and topiramate 100mg  nightly and use of Nurtec for rescue, continued medications at prior visit. Mentioned transient OD vision loss (lasting only seconds) after prolonged screen time without any other associated symptoms and recommended ophthalmology evaluation    Interval History:  Migraines remain well-controlled on current regimen of Emgality and topiramate, use of Nurtec as needed with continued benefit.    Reports improvement of migraine headaches since prior visit currently experiencing about 3-4/month (previously 4-5x per week). Will use Nurtec with resolution of migraine.  Current use of topiramate 100 mg nightly and monthly Emgality injection.  He does mention after looking at the computer screen for period of time and turning it off, he will have some OD vision loss only lasting for several seconds and then resolved.  He does have  chronic floaters in right eye but this has been present for years without any recent changes.  Denies any loss of vision at any other times.  He plans on scheduling follow-up visit with ophthalmologist Dr. Hazle Quant for further evaluation. MRI brain after prior visit unremarkable for acute findings.  Lab work negative for GCA.       Headache days per month: 3-4 Headache free days per month: 26-27   Current Headache Regimen: Preventative: Emgality 120 mg monthly.  Topiramate 100mg  nightly Abortive: Nurtec     Prior Therapies                                  Topamax 100 mg QHS  Emgality 120 mg monthly Zoloft 100 mg daily Imitrex 100 mg PRN Ubrelvy 100 mg PRN Baclofen 10 mg PRN    LABS: CBC    Component Value Date/Time   WBC 10.1 12/29/2021 1450   RBC 4.47 12/29/2021 1450   HGB 13.9 12/29/2021 1450   HCT 42.2 12/29/2021 1450   PLT 153 12/29/2021 1450   MCV 94.4 12/29/2021 1450   MCH 31.1 12/29/2021 1450   MCHC 32.9 12/29/2021 1450   RDW 13.5 12/29/2021 1450   LYMPHSABS 1.2 12/29/2021 1450   MONOABS 1.0 12/29/2021 1450   EOSABS 0.0 12/29/2021 1450   BASOSABS 0.1 12/29/2021 1450      Latest Ref Rng & Units 12/29/2021    2:50 PM 08/03/2020    3:17 PM 02/05/2020    3:50 AM  CMP  Glucose 70 - 99 mg/dL 528  98  228   BUN 8 - 23 mg/dL 14  15  19    Creatinine 0.61 - 1.24 mg/dL 6.57  8.46  9.62   Sodium 135 - 145 mmol/L 137  139  138   Potassium 3.5 - 5.1 mmol/L 4.2  4.4  4.8   Chloride 98 - 111 mmol/L 107  109  109   CO2 22 - 32 mmol/L 24  24  22    Calcium 8.9 - 10.3 mg/dL 9.2  9.0  8.7   Total Protein 6.5 - 8.1 g/dL 6.9     Total Bilirubin 0.3 - 1.2 mg/dL 0.8     Alkaline Phos 38 - 126 U/L 62     AST 15 - 41 U/L 27     ALT 0 - 44 U/L 16        IMAGING:  MRI brain 12/29/21: No acute intracranial abnormality. Mild white matter changes likely chronic microvascular ischemia.  Imaging independently reviewed on June 08, 2023   Current Outpatient Medications on File  Prior to Visit  Medication Sig Dispense Refill   acyclovir (ZOVIRAX) 400 MG tablet Take 400 mg by mouth daily.     albuterol (PROVENTIL HFA;VENTOLIN HFA) 108 (90 Base) MCG/ACT inhaler Inhale 2 puffs into the lungs every 4 (four) hours as needed for wheezing or shortness of breath.     atorvastatin (LIPITOR) 40 MG tablet Take 40 mg by mouth at bedtime.      baclofen (LIORESAL) 10 MG tablet Take 10 mg by mouth daily as needed for muscle spasms.      clopidogrel (PLAVIX) 75 MG tablet Take 1 tablet (75 mg total) by mouth daily. 90 tablet 0   cyanocobalamin (,VITAMIN B-12,) 1000 MCG/ML injection Inject 1 mL (1,000 mcg total) into the muscle every 30 (thirty) days. 3 mL 1   fluticasone (FLONASE) 50 MCG/ACT nasal spray Place 2 sprays into both nostrils daily. 16 g 11   Galcanezumab-gnlm (EMGALITY) 120 MG/ML SOAJ Inject 120 mg into the skin every 30 (thirty) days. 1.12 mL 11   meloxicam (MOBIC) 15 MG tablet Take 15 mg by mouth daily as needed for pain.      montelukast (SINGULAIR) 10 MG tablet Take 10 mg by mouth daily.      ondansetron (ZOFRAN) 8 MG tablet Take 1 tablet (8 mg total) by mouth every 8 (eight) hours as needed for nausea or vomiting. 20 tablet 1   RESTASIS 0.05 % ophthalmic emulsion Place 1 drop into both eyes 2 (two) times daily.     Rimegepant Sulfate (NURTEC) 75 MG TBDP Take 75 mg by mouth as needed. 16 tablet 11   sertraline (ZOLOFT) 100 MG tablet Take 100 mg by mouth daily.     sucralfate (CARAFATE) 1 GM/10ML suspension Take 1 g by mouth in the morning, at noon, and at bedtime.     Tapentadol HCl (NUCYNTA) 100 MG TABS Take 1 tablet (100 mg total) by mouth in the morning, at noon, in the evening, and at bedtime. 30 tablet 0   testosterone cypionate (DEPOTESTOSTERONE CYPIONATE) 200 MG/ML injection Inject 200 mg into the muscle every 30 (thirty) days.     topiramate (TOPAMAX) 100 MG tablet Take 1 tablet (100 mg total) by mouth at bedtime. 90 tablet 3   traZODone (DESYREL) 100 MG tablet  Take 100 mg by mouth at bedtime.      No current facility-administered medications on file prior to visit.     Allergies: Allergies  Allergen Reactions   Oxycontin [Oxycodone]  Other (See Comments)    Unknown   Reglan [Metoclopramide] Nausea Only    shakey    Family History: Family History  Problem Relation Age of Onset   Breast cancer Mother    Diabetes Father    Hypertension Father    Stroke Father    Parkinson's disease Father    Brain cancer Maternal Uncle      Past Medical History: Past Medical History:  Diagnosis Date   B12 deficiency    Depression    Dizziness    DJD (degenerative joint disease)    Gastric ulcer    hx of   GERD (gastroesophageal reflux disease)    History of kidney stones    Hypercholesteremia    Migraine    Pneumonia    Post herpetic neuralgia    Stroke ([facility])    mild 2020 (weak right side), short term memory loss    Past Surgical History Past Surgical History:  Procedure Laterality Date   BACK SURGERY  09/1999, 01/2001   CHOLECYSTECTOMY  2005   COLON SURGERY     CYSTOSCOPY W/ URETERAL STENT PLACEMENT Left 02/04/2020   Procedure: CYSTOSCOPY WITH RETROGRADE PYELOGRAM/URETERAL STENT PLACEMENT;  Surgeon: Bjorn Pippin, MD;  Location: [facility];  Service: Urology;  Laterality: Left;   EXTRACORPOREAL SHOCK WAVE LITHOTRIPSY Left 03/03/2020   Procedure: EXTRACORPOREAL SHOCK WAVE LITHOTRIPSY (ESWL);  Surgeon: Ihor Gully, MD;  Location[facility];  Service: Urology;  Laterality: Left;   EYE SURGERY Right    cataract removal   GASTRIC BYPASS  03/2004   HEMORRHOID SURGERY  2010   HERNIA REPAIR     KNEE SURGERY  1980, 1985, 2015   left shoulder surgery  2007   LUNG SURGERY Left 2002   PROSTATE SURGERY     SPINAL CORD STIMULATOR REMOVAL N/A 01/02/2020   Procedure: Spinal cord stimulator removal  Lumbar;  Surgeon: Tia Alert, MD;  Location: [facility];  Service: Neurosurgery;  Laterality: N/A;   SPINE SURGERY     spinal stimulator    TONSILECTOMY/ADENOIDECTOMY WITH MYRINGOTOMY     TONSILLECTOMY      Social History: Social History   Tobacco Use   Smoking status: Former    Current packs/day: 0.00    Types: Cigarettes    Quit date: 02/14/2001    Years since quitting: 22.3   Smokeless tobacco: Never  Vaping Use   Vaping status: Never Used  Substance Use Topics   Alcohol use: No   Drug use: No     ROS: Negative for fevers, chills. Positive for headaches. All other systems reviewed and negative unless stated otherwise in HPI.   Physical Exam:   Vital Signs: There were no vitals taken for this visit. GENERAL: well appearing,in no acute distress,alert SKIN:  Color, texture, turgor normal. No rashes or lesions HEAD:  Normocephalic/atraumatic.  No temporal pain with pressure CV:  RRR RESP: Normal respiratory effort MSK: no tenderness to palpation over occiput, neck, or shoulders  NEUROLOGICAL: Mental Status: Alert, oriented to person, place and time,Follows commands    03/16/2022    3:00 PM  MMSE - Mini Mental State Exam  Orientation to time 5  Orientation to Place 5  Registration 3  Attention/ Calculation 3  Recall 2  Language- name 2 objects 2  Language- repeat 1  Language- follow 3 step command 3  Language- read & follow direction 1  Write a sentence 1  Copy design 1  Total score 27   Cranial Nerves:  PERRL, visual fields intact to confrontation, extraocular movements intact, facial sensation intact, no facial droop or ptosis, hearing grossly intact, no dysarthria Motor: muscle strength 5/5 both upper and lower extremities Reflexes: 2+ throughout Sensation: intact to light touch all 4 extremities Coordination: Finger-to- nose-finger intact bilaterally Gait: normal-based     IMPRESSION: 62 year old male with a history of TIA, migraines, nephrolithiasis, postherpetic neuralgia, gastric bypass, chronic back pain s/p spinal cord stimulator who presents for follow up of left-sided migraines,  initially seen by Dr. Delena Bali 03/16/2022. He has had significant improvement in his headaches with Emgality.  Reported onset of right sided migraine headaches associated with facial numbness/drooping and blurred vision around 09/2022, MRI brain unremarkable for acute findings, ESR and CRP negative.  Restarted topiramate in addition to Emgality with improvement, currently experiencing 3-4 migraines per month and use of Nurtec with benefit. He does mention ongoing concern of occasional OD vision loss lasting only seconds after looking at computer screen and turning it off.      PLAN: -Prevention: Continue Emgality 120 mg monthly and topiramate 100 mg nightly.   -Rescue: Continue Nurtec 75mg  PRN -Advised to follow-up with ophthalmology regarding transient OD vision loss, if normal ophthalmology exam, could consider completing carotid ultrasound although does not have any of these symptoms outside of looking at computer screen so will hold off for now until further ophthalmology eval    Follow up in 6 months or call earlier if needed    I spent 25 minutes of face-to-face and non-face-to-face time with patient.  This included previsit chart review, lab review, study review, order entry, electronic health record documentation, patient education and discussion regarding the above and answered all other questions to patient's satisfaction  Ihor Austin, [facility]  [facility] [address] [place], [place] [address]  Phone [phone] Fax [phone] Note: This document was prepared with digital dictation and possible smart phrase technology. Any transcriptional errors that result from this process are unintentional.

## 2023-06-09 ENCOUNTER — Ambulatory Visit: Payer: Medicare Other | Admitting: Adult Health

## 2023-06-09 ENCOUNTER — Telehealth: Payer: Self-pay | Admitting: Adult Health

## 2023-06-09 ENCOUNTER — Encounter: Payer: Self-pay | Admitting: Adult Health

## 2023-06-09 NOTE — Telephone Encounter (Signed)
Pt's wife cancelled appt due to pt not feeling well has diarrhea.  Will call back to reschedule.

## 2023-07-04 ENCOUNTER — Other Ambulatory Visit: Payer: Self-pay

## 2023-07-04 MED ORDER — CLOPIDOGREL BISULFATE 75 MG PO TABS: 75.0000 mg | ORAL_TABLET | Freq: Every day | ORAL | 0 refills | Status: DC

## 2023-08-11 ENCOUNTER — Telehealth: Payer: Self-pay

## 2023-08-11 ENCOUNTER — Inpatient Hospital Stay: Payer: Medicare Other

## 2023-08-11 VITALS — BP 116/76 | HR 79 | Temp 97.2°F | Resp 18 | Wt 158.4 lb

## 2023-08-11 DIAGNOSIS — D649 Anemia, unspecified: Secondary | ICD-10-CM | POA: Diagnosis present

## 2023-08-11 DIAGNOSIS — Z87891 Personal history of nicotine dependence: Secondary | ICD-10-CM | POA: Diagnosis not present

## 2023-08-11 DIAGNOSIS — Z9884 Bariatric surgery status: Secondary | ICD-10-CM | POA: Insufficient documentation

## 2023-08-11 DIAGNOSIS — D508 Other iron deficiency anemias: Secondary | ICD-10-CM

## 2023-08-11 DIAGNOSIS — E538 Deficiency of other specified B group vitamins: Secondary | ICD-10-CM | POA: Insufficient documentation

## 2023-08-11 DIAGNOSIS — D509 Iron deficiency anemia, unspecified: Secondary | ICD-10-CM | POA: Insufficient documentation

## 2023-08-11 LAB — CBC WITH DIFFERENTIAL (CANCER CENTER ONLY)
Abs Immature Granulocytes: 0.01 10*3/uL (ref 0.00–0.07)
Basophils Absolute: 0.1 10*3/uL (ref 0.0–0.1)
Basophils Relative: 1 %
Eosinophils Absolute: 0.1 10*3/uL (ref 0.0–0.5)
Eosinophils Relative: 2 %
HCT: 38.7 % — ABNORMAL LOW (ref 39.0–52.0)
Hemoglobin: 12.3 g/dL — ABNORMAL LOW (ref 13.0–17.0)
Immature Granulocytes: 0 %
Lymphocytes Relative: 23 %
Lymphs Abs: 1.2 10*3/uL (ref 0.7–4.0)
MCH: 28.3 pg (ref 26.0–34.0)
MCHC: 31.8 g/dL (ref 30.0–36.0)
MCV: 89 fL (ref 80.0–100.0)
Monocytes Absolute: 0.5 10*3/uL (ref 0.1–1.0)
Monocytes Relative: 10 %
Neutro Abs: 3.4 10*3/uL (ref 1.7–7.7)
Neutrophils Relative %: 64 %
Platelet Count: 187 10*3/uL (ref 150–400)
RBC: 4.35 MIL/uL (ref 4.22–5.81)
RDW: 19.7 % — ABNORMAL HIGH (ref 11.5–15.5)
WBC Count: 5.4 10*3/uL (ref 4.0–10.5)
nRBC: 0 % (ref 0.0–0.2)

## 2023-08-11 LAB — FERRITIN: Ferritin: 9 ng/mL — ABNORMAL LOW (ref 24–336)

## 2023-08-11 LAB — FOLATE: Folate: 33.4 ng/mL (ref >5.9)

## 2023-08-11 NOTE — Progress Notes (Signed)
[facility] CONSULT NOTE  Patient Care Team: Frederich Chick., MD as PCP - General (Family Medicine)  ASSESSMENT & PLAN:  @AGE  male with history of stroke, B12 deficiency, hyperlipidemia, GERD, DJD refer to medical oncology due to history of bariatric surgery.  No problem-specific Assessment & Plan notes found for this encounter.  Orders Placed This Encounter  Procedures   CBC with Differential (Cancer Center Only)    Standing Status:   Future    Number of Occurrences:   1    Standing Expiration Date:   08/10/2024   Ferritin    Standing Status:   Future    Number of Occurrences:   1    Standing Expiration Date:   08/10/2024   Folate    Standing Status:   Future    Number of Occurrences:   1    Standing Expiration Date:   08/10/2024    Relevant history: History of gastric bypass Last colonoscopy: 2024 Last EGD: 2024  The mechanism of IDA is due to either blood loss or decreased absorptive mechanism or both. In him is from lack of adequate absorption. We discussed some of the risks, benefits, and alternatives of intravenous iron infusions. The patient is symptomatic from anemia and the iron level is critically low. He tolerated oral iron supplement poorly and desires to achieved higher levels of iron faster for adequate hematopoesis. Some of the side-effects to be expected including risks of infusion reactions, phlebitis, headaches, nausea and fatigue.  The patient is willing to proceed.   IV iron feraheme ordered. CBC, Iron, TIBC, ferritin in end of January  All questions were answered. The patient knows to call the clinic with any problems, questions or concerns.  Melven Sartorius, MD 11/14/20242:58 PM   CHIEF COMPLAINTS/PURPOSE OF CONSULTATION:  Anemia  HISTORY OF PRESENTING ILLNESS:  Ryan Nicholson 62 y.o. male is here because of history of bariatric surgery in 2005 for weight loss. He has been taking multivitamin. He has taking iron supplement for  about a month. He gets b12 injection monthly for years by her wife. Baseline nausea. Report history of bleeding ulcer in the stomach in 2023. Report he had EGD and colonoscopy in 2023 and again in 2024 with colonoscopy with 11 polyps removed.  Outside labs dated 07/04/2023 WBC 6.2 hemoglobin 11.7 MCV 87 platelet 219.  B12 1344 Creatinine 1.0.  Globulin 2.4. Ferritin 5.  Ryan Nicholson had not noticed any recent bleeding such as epistaxis, hematuria or hematochezia The patient denies over the counter aspirin, NSAID ingestion. He has been Plavix for history of CVA until 3 weeks ago.  He denies craving for ice, or dirt. Lately more with steak than usual.   MEDICAL HISTORY:  Past Medical History:  Diagnosis Date   B12 deficiency    Depression    Dizziness    DJD (degenerative joint disease)    Gastric ulcer    hx of   GERD (gastroesophageal reflux disease)    History of kidney stones    Hypercholesteremia    Migraine    Pneumonia    Post herpetic neuralgia    Stroke (HCC)    mild 2020 (weak right side), short term memory loss    SURGICAL HISTORY: Past Surgical History:  Procedure Laterality Date   BACK SURGERY  09/1999, 01/2001   CHOLECYSTECTOMY  2005   COLON SURGERY     CYSTOSCOPY W/ URETERAL STENT PLACEMENT Left 02/04/2020   Procedure: CYSTOSCOPY WITH RETROGRADE PYELOGRAM/URETERAL STENT PLACEMENT;  Surgeon: Bjorn Pippin, MD;  Location: [facility];  Service: Urology;  Laterality: Left;   EXTRACORPOREAL SHOCK WAVE LITHOTRIPSY Left 03/03/2020   Procedure: EXTRACORPOREAL SHOCK WAVE LITHOTRIPSY (ESWL);  Surgeon: Ihor Gully, MD;  Location: [facility];  Service: Urology;  Laterality: Left;   EYE SURGERY Right    cataract removal   GASTRIC BYPASS  03/2004   HEMORRHOID SURGERY  2010   HERNIA REPAIR     KNEE SURGERY  1980, 1985, 2015   left shoulder surgery  2007   LUNG SURGERY Left 2002   PROSTATE SURGERY     SPINAL CORD STIMULATOR REMOVAL N/A 01/02/2020   Procedure: Spinal  cord stimulator removal  Lumbar;  Surgeon: Tia Alert, MD;  Location: [facility] OR;  Service: Neurosurgery;  Laterality: N/A;   SPINE SURGERY     spinal stimulator   TONSILECTOMY/ADENOIDECTOMY WITH MYRINGOTOMY     TONSILLECTOMY      SOCIAL HISTORY: Social History   Socioeconomic History   Marital status: Married    Spouse name: Mary   Number of children: Not on file   Years of education: Not on file   Highest education level: Not on file  Occupational History   Not on file  Tobacco Use   Smoking status: Former    Current packs/day: 0.00    Types: Cigarettes    Quit date: 02/14/2001    Years since quitting: 22.5   Smokeless tobacco: Never  Vaping Use   Vaping status: Never Used  Substance and Sexual Activity   Alcohol use: No   Drug use: No   Sexual activity: Not on file  Other Topics Concern   Not on file  Social History Narrative   Lives with wife   Social Determinants of Health   Financial Resource Strain: Low Risk  (11/11/2022)   Received from [facility], [facility]   Overall Financial Resource Strain (CARDIA)    Difficulty of Paying Living Expenses: Not hard at all  Food Insecurity: No Food Insecurity (11/11/2022)   Received from [facility], [facility]   Hunger Vital Sign    Worried About Running Out of Food in the Last Year: Never true    Ran Out of Food in the Last Year: Never true  Transportation Needs: No Transportation Needs (11/11/2022)   Received from [facility], [facility]   PRAPARE - Transportation    Lack of Transportation (Medical): No    Lack of Transportation (Non-Medical): No  Physical Activity: Not on file  Stress: Not on file  Social Connections: Unknown (10/26/2022)   Received from [facility], [facility]   Social Network    Social Network: Not on file  Intimate Partner Violence: Unknown (10/26/2022)   Received from [facility], [facility]   HITS    Physically Hurt: Not on file    Insult or Talk Down To: Not on file     Threaten Physical Harm: Not on file    Scream or Curse: Not on file    FAMILY HISTORY: Family History  Problem Relation Age of Onset   Breast cancer Mother    Diabetes Father    Hypertension Father    Stroke Father    Parkinson's disease Father    Brain cancer Maternal Uncle     ALLERGIES:  is allergic to oxycontin [oxycodone] and reglan [metoclopramide].  MEDICATIONS:  Current Outpatient Medications  Medication Sig Dispense Refill   levothyroxine (SYNTHROID) 125 MCG tablet TAKE 1 TABLET BY MOUTH DAILY ON  AN EMPTY STOMACH AND THEN NOTHING BY MOUTH FOR AT LEAST 90 MINS     acyclovir (ZOVIRAX) 400 MG tablet Take 400 mg by mouth daily.     albuterol (PROVENTIL HFA;VENTOLIN HFA) 108 (90 Base) MCG/ACT inhaler Inhale 2 puffs into the lungs every 4 (four) hours as needed for wheezing or shortness of breath.     atorvastatin (LIPITOR) 40 MG tablet Take 40 mg by mouth at bedtime.      baclofen (LIORESAL) 10 MG tablet Take 10 mg by mouth daily as needed for muscle spasms.      clopidogrel (PLAVIX) 75 MG tablet Take 1 tablet (75 mg total) by mouth daily. 90 tablet 0   cyanocobalamin (,VITAMIN B-12,) 1000 MCG/ML injection Inject 1 mL (1,000 mcg total) into the muscle every 30 (thirty) days. 3 mL 1   fluticasone (FLONASE) 50 MCG/ACT nasal spray Place 2 sprays into both nostrils daily. 16 g 11   Galcanezumab-gnlm (EMGALITY) 120 MG/ML SOAJ Inject 120 mg into the skin every 30 (thirty) days. 1.12 mL 11   meloxicam (MOBIC) 15 MG tablet Take 15 mg by mouth daily as needed for pain.      montelukast (SINGULAIR) 10 MG tablet Take 10 mg by mouth daily.      ondansetron (ZOFRAN) 8 MG tablet Take 1 tablet (8 mg total) by mouth every 8 (eight) hours as needed for nausea or vomiting. 20 tablet 1   RESTASIS 0.05 % ophthalmic emulsion Place 1 drop into both eyes 2 (two) times daily.     Rimegepant Sulfate (NURTEC) 75 MG TBDP Take 75 mg by mouth as needed. 16 tablet 11   sertraline (ZOLOFT) 100 MG tablet  Take 100 mg by mouth daily.     sucralfate (CARAFATE) 1 GM/10ML suspension Take 1 g by mouth in the morning, at noon, and at bedtime.     Tapentadol HCl (NUCYNTA) 100 MG TABS Take 1 tablet (100 mg total) by mouth in the morning, at noon, in the evening, and at bedtime. 30 tablet 0   testosterone cypionate (DEPOTESTOSTERONE CYPIONATE) 200 MG/ML injection Inject 200 mg into the muscle every 30 (thirty) days.     topiramate (TOPAMAX) 100 MG tablet Take 1 tablet (100 mg total) by mouth at bedtime. 90 tablet 3   traZODone (DESYREL) 100 MG tablet Take 100 mg by mouth at bedtime.      No current facility-administered medications for this visit.    REVIEW OF SYSTEMS:   Respiratory: Denies shortness of breath Cardiovascular: Denies chest pain, chest discomfort Gastrointestinal:  Denies nausea, abdominal pain, melena or bloody stools GU: no hematuria Skin: skin color change All other systems were reviewed with the patient and are negative.  PHYSICAL EXAMINATION: ECOG PERFORMANCE STATUS: 1 - Symptomatic but completely ambulatory  Vitals:   08/11/23 1043  BP: 116/76  Pulse: 79  Resp: 18  Temp: (!) 97.2 F (36.2 C)  SpO2: 100%   Filed Weights   08/11/23 1043  Weight: 158 lb 6 oz (71.8 kg)    GENERAL: alert, no distress and comfortable SKIN: skin color normal EYES: normal conjunctiva, sclera clear LUNGS: normal breathing effort HEART: regular rate & rhythm ABDOMEN: abdomen soft, non-tender and nondistended  RADIOGRAPHIC STUDIES: I have personally reviewed the radiological images as listed and agreed with the findings in the report. No results found.

## 2023-08-11 NOTE — Telephone Encounter (Signed)
Dr. Cherly Hensen, patient will be scheduled as soon as possible.  Auth Submission: NO AUTH NEEDED Site of care: Site of care: [facility] Payer: [employer] medicare Medication & CPT/J Code(s) submitted: Feraheme (ferumoxytol) [id number] Route of submission (phone, fax, portal): portal Phone # Fax # Auth type: Buy/Bill PB Units/visits requested: 510mg  x 2 doses Reference number: [id number] Approval from: 08/11/23 to 09/27/23   Confirmed on the [facility] portal that Feraheme does not need a prior authorization for this patient.

## 2023-09-01 ENCOUNTER — Ambulatory Visit: Payer: Medicare Other

## 2023-09-01 VITALS — BP 105/69 | HR 63 | Temp 97.5°F | Resp 18 | Ht 67.0 in | Wt 152.8 lb

## 2023-09-01 DIAGNOSIS — Z9884 Bariatric surgery status: Secondary | ICD-10-CM

## 2023-09-01 DIAGNOSIS — D508 Other iron deficiency anemias: Secondary | ICD-10-CM

## 2023-09-01 DIAGNOSIS — D649 Anemia, unspecified: Secondary | ICD-10-CM

## 2023-09-01 MED ORDER — DIPHENHYDRAMINE HCL 25 MG PO CAPS
25.0000 mg | ORAL_CAPSULE | Freq: Once | ORAL | Status: AC
  Administered 2023-09-01: 25 mg via ORAL
  Filled 2023-09-01: qty 1

## 2023-09-01 MED ORDER — ACETAMINOPHEN 325 MG PO TABS
650.0000 mg | ORAL_TABLET | Freq: Once | ORAL | Status: AC
Start: 2023-09-01 — End: 2023-09-01
  Administered 2023-09-01: 650 mg via ORAL
  Filled 2023-09-01: qty 2

## 2023-09-01 MED ORDER — SODIUM CHLORIDE 0.9 % IV SOLN
510.0000 mg | Freq: Once | INTRAVENOUS | Status: AC
  Administered 2023-09-01: 510 mg via INTRAVENOUS
  Filled 2023-09-01: qty 17

## 2023-09-01 NOTE — Progress Notes (Signed)
Diagnosis: Iron Deficiency Anemia  Provider:  Chilton Greathouse MD  Procedure: IV Infusion  IV Type: Peripheral, IV Location: R Hand  Feraheme (Ferumoxytol), Dose: 510 mg  Infusion Start Time: 1019  Infusion Stop Time: 1036  Post Infusion IV Care: Observation period completed and Peripheral IV Discontinued  Discharge: Condition: Good, Destination: Home . AVS Provided  Performed by:  Adriana Mccallum, RN

## 2023-09-01 NOTE — Patient Instructions (Signed)
 Ferumoxytol Injection What is this medication? FERUMOXYTOL (FER ue MOX i tol) treats low levels of iron in your body (iron deficiency anemia). Iron is a mineral that plays an important role in making red blood cells, which carry oxygen from your lungs to the rest of your body. This medicine may be used for other purposes; ask your health care provider or pharmacist if you have questions. COMMON BRAND NAME(S): Feraheme What should I tell my care team before I take this medication? They need to know if you have any of these conditions: Anemia not caused by low iron levels High levels of iron in the blood Magnetic resonance imaging (MRI) test scheduled An unusual or allergic reaction to iron, other medications, foods, dyes, or preservatives Pregnant or trying to get pregnant Breastfeeding How should I use this medication? This medication is injected into a vein. It is given by your care team in a hospital or clinic setting. Talk to your care team the use of this medication in children. Special care may be needed. Overdosage: If you think you have taken too much of this medicine contact a poison control center or emergency room at once. NOTE: This medicine is only for you. Do not share this medicine with others. What if I miss a dose? It is important not to miss your dose. Call your care team if you are unable to keep an appointment. What may interact with this medication? Other iron products This list may not describe all possible interactions. Give your health care provider a list of all the medicines, herbs, non-prescription drugs, or dietary supplements you use. Also tell them if you smoke, drink alcohol, or use illegal drugs. Some items may interact with your medicine. What should I watch for while using this medication? Visit your care team regularly. Tell your care team if your symptoms do not start to get better or if they get worse. You may need blood work done while you are taking this  medication. You may need to follow a special diet. Talk to your care team. Foods that contain iron include: whole grains/cereals, dried fruits, beans, or peas, leafy green vegetables, and organ meats (liver, kidney). What side effects may I notice from receiving this medication? Side effects that you should report to your care team as soon as possible: Allergic reactions--skin rash, itching, hives, swelling of the face, lips, tongue, or throat Low blood pressure--dizziness, feeling faint or lightheaded, blurry vision Shortness of breath Side effects that usually do not require medical attention (report to your care team if they continue or are bothersome): Flushing Headache Joint pain Muscle pain Nausea Pain, redness, or irritation at injection site This list may not describe all possible side effects. Call your doctor for medical advice about side effects. You may report side effects to FDA at 1-800-FDA-1088. Where should I keep my medication? This medication is given in a hospital or clinic. It will not be stored at home. NOTE: This sheet is a summary. It may not cover all possible information. If you have questions about this medicine, talk to your doctor, pharmacist, or health care provider.  2024 Elsevier/Gold Standard (2023-02-18 00:00:00)

## 2023-09-07 ENCOUNTER — Ambulatory Visit: Payer: Medicare Other | Admitting: Psychiatry

## 2023-09-08 ENCOUNTER — Ambulatory Visit: Payer: Medicare Other | Admitting: *Deleted

## 2023-09-08 VITALS — BP 102/65 | HR 83 | Temp 97.9°F | Resp 18 | Ht 67.0 in | Wt 153.4 lb

## 2023-09-08 DIAGNOSIS — D508 Other iron deficiency anemias: Secondary | ICD-10-CM

## 2023-09-08 DIAGNOSIS — D649 Anemia, unspecified: Secondary | ICD-10-CM | POA: Diagnosis not present

## 2023-09-08 DIAGNOSIS — Z9884 Bariatric surgery status: Secondary | ICD-10-CM

## 2023-09-08 MED ORDER — SODIUM CHLORIDE 0.9 % IV SOLN
510.0000 mg | Freq: Once | INTRAVENOUS | Status: AC
  Administered 2023-09-08: 510 mg via INTRAVENOUS
  Filled 2023-09-08: qty 17

## 2023-09-08 MED ORDER — ACETAMINOPHEN 325 MG PO TABS
650.0000 mg | ORAL_TABLET | Freq: Once | ORAL | Status: AC
Start: 2023-09-08 — End: 2023-09-08
  Administered 2023-09-08: 650 mg via ORAL
  Filled 2023-09-08: qty 2

## 2023-09-08 MED ORDER — DIPHENHYDRAMINE HCL 25 MG PO CAPS
25.0000 mg | ORAL_CAPSULE | Freq: Once | ORAL | Status: AC
  Administered 2023-09-08: 25 mg via ORAL
  Filled 2023-09-08: qty 1

## 2023-09-08 NOTE — Progress Notes (Signed)
Diagnosis: Iron Deficiency Anemia  Provider:  Chilton Greathouse MD  Procedure: IV Infusion  IV Type: Peripheral, IV Location: L Forearm  Feraheme (Ferumoxytol), Dose: 510 mg  Infusion Start Time: 0949 am  Infusion Stop Time: 1006 am  Post Infusion IV Care: Observation period completed and Peripheral IV Discontinued  Discharge: Condition: Good, Destination: Home . AVS Provided  Performed by:  Forrest Moron, RN

## 2023-09-08 NOTE — Addendum Note (Signed)
Addended by: Loney Hering on: 09/08/2023 03:32 PM   Modules accepted: Orders

## 2023-09-12 ENCOUNTER — Other Ambulatory Visit: Payer: Self-pay

## 2023-09-12 MED ORDER — EMGALITY 120 MG/ML ~~LOC~~ SOAJ: 120.0000 mg | SUBCUTANEOUS | 11 refills | Status: DC

## 2023-10-05 ENCOUNTER — Other Ambulatory Visit: Payer: Self-pay

## 2023-10-05 MED ORDER — CLOPIDOGREL BISULFATE 75 MG PO TABS: 75.0000 mg | ORAL_TABLET | Freq: Every day | ORAL | 0 refills | Status: DC

## 2023-10-06 NOTE — Progress Notes (Addendum)
 COVID Vaccine received:  []  No [x]  Yes Date of any COVID positive Test in last 90 days: no PCP - Dr. Henry Saba Cardiologist - [facility].  Chest x-ray -  EKG -  09/16/23  Stress Test -  ECHO - 05/18/19 Epic Cardiac Cath -   Bowel Prep - [x]  No  []   Yes ______  Pacemaker / ICD device [x]  No []  Yes   Spinal Cord Stimulator:[x]  No []  Yes       History of Sleep Apnea? []  No [x]  Yes   CPAP used?- [x]  No []  Yes    Does the patient monitor blood sugar?          [x]  No []  Yes  []  N/A  Patient has: [x]  NO Hx DM   []  Pre-DM                 []  DM1  []   DM2 Does patient have a [employer] or Dexacom? []  No []  Yes   Fasting Blood Sugar Ranges-  Checks Blood Sugar _____ times a day  GLP1 agonist / usual dose - no GLP1 instructions:  SGLT-2 inhibitors / usual dose - no SGLT-2 instructions:   Blood Thinner / Instructions:no Aspirin  Instructions:no  Comments:   Activity level: Patient is able  to climb a flight of stairs without difficulty; [x]  No CP  [x]  No SOB, but would have _knee pain__   Patient can  perform ADLs without assistance.   Anesthesia review: TIA 2021, Iron def. Anemia, hypothyroid  Patient denies shortness of breath, fever, cough and chest pain at PAT appointment.  Patient verbalized understanding and agreement to the Pre-Surgical Instructions that were given to them at this PAT appointment. Patient was also educated of the need to review these PAT instructions again prior to his/her surgery.I reviewed the appropriate phone numbers to call if they have any and questions or concerns.

## 2023-10-06 NOTE — Patient Instructions (Signed)
 SURGICAL WAITING ROOM VISITATION  Patients having surgery or a procedure may have no more than 2 support people in the waiting area - these visitors may rotate.    Children under the age of 67 must have an adult with them who is not the patient.  Due to an increase in RSV and influenza rates and associated hospitalizations, children ages 37 and under may not visit patients in [facility] hospitals.  If the patient needs to stay at the hospital during part of their recovery, the visitor guidelines for inpatient rooms apply. Pre-op nurse will coordinate an appropriate time for 1 support person to accompany patient in pre-op.  This support person may not rotate.    Please refer to the [facility] website for the visitor guidelines for Inpatients (after your surgery is over and you are in a regular room).       Your procedure is scheduled on: 10/18/23   Report to [facility] Main Entrance    Report to admitting at  10 AM   Call this number if you have problems the morning of surgery [phone]   Do not eat food :After Midnight.   After Midnight you may have the following liquids until 9:30 AM DAY OF SURGERY  Water  Non-Citrus Juices (without pulp, NO RED-Apple, White grape, White cranberry) Black Coffee (NO MILK/CREAM OR CREAMERS, sugar ok)  Clear Tea (NO MILK/CREAM OR CREAMERS, sugar ok) regular and decaf                             Plain Jell-O (NO RED)                                           Fruit ices (not with fruit pulp, NO RED)                                     Popsicles (NO RED)                                                               Sports drinks like Gatorade (NO RED)                The day of surgery:  Drink ONE (1) Pre-Surgery Clear Ensure at 9:30 AM the morning of surgery. Drink in one sitting. Do not sip.  This drink was given to you during your hospital  pre-op appointment visit. Nothing else to drink after completing the  Pre-Surgery Clear  Ensure      Oral Hygiene is also important to reduce your risk of infection.                                    Remember - BRUSH YOUR TEETH THE MORNING OF SURGERY WITH YOUR REGULAR TOOTHPASTE   Stop all vitamins and herbal supplements 7 days before surgery.   Take these medicines the morning of surgery with A SIP OF WATER : Atorvastatin , Prevacid, Synthroid , Singlair, Sertraline ,  Tapentadol , Topamax ,  Inhalers if needed, Nasal spray if needed, Eye drops if needed.  Bring CPAP mask and tubing day of surgery.                              You may not have any metal on your body including hair pins, jewelry, and body piercing             Do not wear make-up, lotions, powders, perfumes/cologne, or deodorant              Men may shave face and neck.   Do not bring valuables to the hospital. [facility] IS NOT             RESPONSIBLE   FOR VALUABLES.   Contacts, glasses, dentures or bridgework may not be worn into surgery.   Bring small overnight bag day of surgery.   DO NOT BRING YOUR HOME MEDICATIONS TO THE HOSPITAL. PHARMACY WILL DISPENSE MEDICATIONS LISTED ON YOUR MEDICATION LIST TO YOU DURING YOUR ADMISSION IN THE HOSPITAL!    Patients discharged on the day of surgery will not be allowed to drive home.  Someone NEEDS to stay with you for the first 24 hours after anesthesia.   Special Instructions: Bring a copy of your healthcare power of attorney and living will documents the day of surgery if you haven't scanned them before.              Please read over the following fact sheets you were given: IF YOU HAVE QUESTIONS ABOUT YOUR PRE-OP INSTRUCTIONS PLEASE CALL [phone] [place]   If you received a COVID test during your pre-op visit  it is requested that you wear a mask when out in public, stay away from anyone that may not be feeling well and notify your surgeon if you develop symptoms. If you test positive for Covid or have been in contact with anyone that has tested positive in  the last 10 days please notify you surgeon.      Pre-operative 5 CHG Bath Instructions   You can play a key role in reducing the risk of infection after surgery. Your skin needs to be as free of germs as possible. You can reduce the number of germs on your skin by washing with CHG (chlorhexidine  gluconate) soap before surgery. CHG is an antiseptic soap that kills germs and continues to kill germs even after washing.   DO NOT use if you have an allergy to chlorhexidine /CHG or antibacterial soaps. If your skin becomes reddened or irritated, stop using the CHG and notify one of our RNs at [phone].   Please shower with the CHG soap starting 4 days before surgery using the following schedule:     Please keep in mind the following:  DO NOT shave, including legs and underarms, starting the day of your first shower.   You may shave your face at any point before/day of surgery.  Place clean sheets on your bed the day you start using CHG soap. Use a clean washcloth (not used since being washed) for each shower. DO NOT sleep with pets once you start using the CHG.   CHG Shower Instructions:  If you choose to wash your hair and private area, wash first with your normal shampoo/soap.  After you use shampoo/soap, rinse your hair and body thoroughly to remove shampoo/soap residue.  Turn the water  OFF and apply about 3 tablespoons (45 ml) of CHG soap to a  CLEAN washcloth.  Apply CHG soap ONLY FROM YOUR NECK DOWN TO YOUR TOES (washing for 3-5 minutes)  DO NOT use CHG soap on face, private areas, open wounds, or sores.  Pay special attention to the area where your surgery is being performed.  If you are having back surgery, having someone wash your back for you may be helpful. Wait 2 minutes after CHG soap is applied, then you may rinse off the CHG soap.  Pat dry with a clean towel  Put on clean clothes/pajamas   If you choose to wear lotion, please use ONLY the CHG-compatible lotions on the back  of this paper.     Additional instructions for the day of surgery: DO NOT APPLY any lotions, deodorants, cologne, or perfumes.   Put on clean/comfortable clothes.  Brush your teeth.  Ask your nurse before applying any prescription medications to the skin.      CHG Compatible Lotions   Aveeno Moisturizing lotion  Cetaphil Moisturizing Cream  Cetaphil Moisturizing Lotion  Clairol Herbal Essence Moisturizing Lotion, Dry Skin  Clairol Herbal Essence Moisturizing Lotion, Extra Dry Skin  Clairol Herbal Essence Moisturizing Lotion, Normal Skin  Curel Age Defying Therapeutic Moisturizing Lotion with Alpha Hydroxy  Curel Extreme Care Body Lotion  Curel Soothing Hands Moisturizing Hand Lotion  Curel Therapeutic Moisturizing Cream, Fragrance-Free  Curel Therapeutic Moisturizing Lotion, Fragrance-Free  Curel Therapeutic Moisturizing Lotion, Original Formula  Eucerin Daily Replenishing Lotion  Eucerin Dry Skin Therapy Plus Alpha Hydroxy Crme  Eucerin Dry Skin Therapy Plus Alpha Hydroxy Lotion  Eucerin Original Crme  Eucerin Original Lotion  Eucerin Plus Crme Eucerin Plus Lotion  Eucerin TriLipid Replenishing Lotion  Keri Anti-Bacterial Hand Lotion  Keri Deep Conditioning Original Lotion Dry Skin Formula Softly Scented  Keri Deep Conditioning Original Lotion, Fragrance Free Sensitive Skin Formula  Keri Lotion Fast Absorbing Fragrance Free Sensitive Skin Formula  Keri Lotion Fast Absorbing Softly Scented Dry Skin Formula  Keri Original Lotion  Keri Skin Renewal Lotion Keri Silky Smooth Lotion  Keri Silky Smooth Sensitive Skin Lotion  Nivea Body Creamy Conditioning Oil  Nivea Body Extra Enriched Lotion  Nivea Body Original Lotion  Nivea Body Sheer Moisturizing Lotion Nivea Crme  Nivea Skin Firming Lotion  NutraDerm 30 Skin Lotion  NutraDerm Skin Lotion  NutraDerm Therapeutic Skin Cream  NutraDerm Therapeutic Skin Lotion  ProShield Protective Hand Cream   Incentive  Spirometer (Watch this video at home: [email or web address])  An incentive spirometer is a tool that can help keep your lungs clear and active. This tool measures how well you are filling your lungs with each breath. Taking long deep breaths may help reverse or decrease the chance of developing breathing (pulmonary) problems (especially infection) following: A long period of time when you are unable to move or be active. BEFORE THE PROCEDURE  If the spirometer includes an indicator to show your best effort, your nurse or respiratory therapist will set it to a desired goal. If possible, sit up straight or lean slightly forward. Try not to slouch. Hold the incentive spirometer in an upright position. INSTRUCTIONS FOR USE  Sit on the edge of your bed if possible, or sit up as far as you can in bed or on a chair. Hold the incentive spirometer in an upright position. Breathe out normally. Place the mouthpiece in your mouth and seal your lips tightly around it. Breathe in slowly and as deeply as possible, raising the piston or the ball toward the top of the column. Hold your breath  for 3-5 seconds or for as long as possible. Allow the piston or ball to fall to the bottom of the column. Remove the mouthpiece from your mouth and breathe out normally. Rest for a few seconds and repeat Steps 1 through 7 at least 10 times every 1-2 hours when you are awake. Take your time and take a few normal breaths between deep breaths. The spirometer may include an indicator to show your best effort. Use the indicator as a goal to work toward during each repetition. After each set of 10 deep breaths, practice coughing to be sure your lungs are clear. If you have an incision (the cut made at the time of surgery), support your incision when coughing by placing a pillow or rolled up towels firmly against it. Once you are able to get out of bed, walk around indoors and cough well. You may stop using  the incentive spirometer when instructed by your caregiver.  RISKS AND COMPLICATIONS Take your time so you do not get dizzy or light-headed. If you are in pain, you may need to take or ask for pain medication before doing incentive spirometry. It is harder to take a deep breath if you are having pain. AFTER USE Rest and breathe slowly and easily. It can be helpful to keep track of a log of your progress. Your caregiver can provide you with a simple table to help with this. If you are using the spirometer at home, follow these instructions: SEEK MEDICAL CARE IF:  You are having difficultly using the spirometer. You have trouble using the spirometer as often as instructed. Your pain medication is not giving enough relief while using the spirometer. You develop fever of 100.5 F (38.1 C) or higher. SEEK IMMEDIATE MEDICAL CARE IF:  You cough up bloody sputum that had not been present before. You develop fever of 102 F (38.9 C) or greater. You develop worsening pain at or near the incision site. MAKE SURE YOU:  Understand these instructions. Will watch your condition. Will get help right away if you are not doing well or get worse. Document Released: 01/24/2007 Document Revised: 12/06/2011 Document Reviewed: 03/27/2007 [facility] Patient Information 2014 [place], [place].

## 2023-10-10 ENCOUNTER — Encounter (HOSPITAL_COMMUNITY): Payer: Medicare Other

## 2023-10-10 ENCOUNTER — Encounter (HOSPITAL_COMMUNITY): Payer: Self-pay

## 2023-10-10 ENCOUNTER — Encounter (HOSPITAL_COMMUNITY)
Admission: RE | Admit: 2023-10-10 | Discharge: 2023-10-10 | Disposition: A | Discharge status: Home / Self Care | Payer: Medicare Other | Source: Ambulatory Visit | Attending: Orthopedic Surgery

## 2023-10-10 ENCOUNTER — Other Ambulatory Visit: Payer: Self-pay

## 2023-10-10 VITALS — BP 122/78 | HR 94 | Temp 98.2°F | Resp 16 | Ht 67.0 in

## 2023-10-10 DIAGNOSIS — Z01812 Encounter for preprocedural laboratory examination: Secondary | ICD-10-CM | POA: Insufficient documentation

## 2023-10-10 DIAGNOSIS — D649 Anemia, unspecified: Secondary | ICD-10-CM | POA: Diagnosis not present

## 2023-10-10 DIAGNOSIS — Z01818 Encounter for other preprocedural examination: Secondary | ICD-10-CM

## 2023-10-10 HISTORY — DX: Hypothyroidism, unspecified: E03.9

## 2023-10-10 LAB — CBC
HCT: 44.7 % (ref 39.0–52.0)
Hemoglobin: 14.2 g/dL (ref 13.0–17.0)
MCH: 29.8 pg (ref 26.0–34.0)
MCHC: 31.8 g/dL (ref 30.0–36.0)
MCV: 93.9 fL (ref 80.0–100.0)
Platelets: 266 10*3/uL (ref 150–400)
RBC: 4.76 MIL/uL (ref 4.22–5.81)
RDW: 16 % — ABNORMAL HIGH (ref 11.5–15.5)
WBC: 7.2 10*3/uL (ref 4.0–10.5)
nRBC: 0 % (ref 0.0–0.2)

## 2023-10-10 LAB — SURGICAL PCR SCREEN
MRSA, PCR: NEGATIVE
Staphylococcus aureus: NEGATIVE

## 2023-10-10 NOTE — H&P (Signed)
 KNEE ARTHROPLASTY ADMISSION H&P  Patient ID: Ryan Nicholson MRN: [id number] DOB/AGE: 64-Nov-1962 64 y.o.  Chief Complaint: right knee pain.  Planned Procedure Date: 10/18/23 Medical Clearance by Dr. Lamar Saba   Cardiac Clearance by Dr. Jeronimo Neurology clearance by Harlene Brook Cue NP Pain management clearance by Dr. Saba   HPI: Ryan Nicholson is a 63 y.o. male who presents for evaluation of OA RIGHT KNEE. The patient has a history of pain and functional disability in the right knee due to arthritis and has failed non-surgical conservative treatments for greater than 12 weeks to include NSAID's and/or analgesics, corticosteriod injections, use of assistive devices, and activity modification.  Onset of symptoms was gradual, starting >10 years ago with gradually worsening course since that time. The patient noted prior procedures on the knee to include  arthroscopy and menisectomy on the right knee.  Patient currently rates pain at 7 out of 10 with activity. Patient has night pain, worsening of pain with activity and weight bearing, and pain that interferes with activities of daily living.  Patient has evidence of subchondral sclerosis, periarticular osteophytes, joint space narrowing, and chondrocalcinosis  by imaging studies.  There is no active infection.  Past Medical History:  Diagnosis Date   B12 deficiency    Depression    Dizziness    DJD (degenerative joint disease)    Gastric ulcer    hx of   GERD (gastroesophageal reflux disease)    History of kidney stones    Hypercholesteremia    Migraine    Pneumonia    Post herpetic neuralgia    Stroke (HCC)    mild 2020 (weak right side), short term memory loss   Past Surgical History:  Procedure Laterality Date   BACK SURGERY  09/1999, 01/2001   CHOLECYSTECTOMY  2005   COLON SURGERY     CYSTOSCOPY W/ URETERAL STENT PLACEMENT Left 02/04/2020   Procedure: CYSTOSCOPY WITH RETROGRADE PYELOGRAM/URETERAL STENT PLACEMENT;  Surgeon:  Watt Rush, MD;  Location: [facility];  Service: Urology;  Laterality: Left;   EXTRACORPOREAL SHOCK WAVE LITHOTRIPSY Left 03/03/2020   Procedure: EXTRACORPOREAL SHOCK WAVE LITHOTRIPSY (ESWL);  Surgeon: Ottelin, Mark, MD;  Location: [facility];  Service: Urology;  Laterality: Left;   EYE SURGERY Right    cataract removal   GASTRIC BYPASS  03/2004   HEMORRHOID SURGERY  2010   HERNIA REPAIR     KNEE SURGERY  1980, 1985, 2015   left shoulder surgery  2007   LUNG SURGERY Left 2002   PROSTATE SURGERY     SPINAL CORD STIMULATOR REMOVAL N/A 01/02/2020   Procedure: Spinal cord stimulator removal  Lumbar;  Surgeon: Joshua Alm RAMAN, MD;  Location: [facility] OR;  Service: Neurosurgery;  Laterality: N/A;   SPINE SURGERY     spinal stimulator   TONSILECTOMY/ADENOIDECTOMY WITH MYRINGOTOMY     TONSILLECTOMY     Allergies  Allergen Reactions   Oxycontin  [Oxycodone ] Other (See Comments)    Unknown   Reglan [Metoclopramide] Nausea Only    shakey   Prior to Admission medications   Medication Sig Start Date End Date Taking? Authorizing Provider  acyclovir  (ZOVIRAX ) 400 MG tablet Take 400 mg by mouth daily. 02/28/22  Yes [provider]  albuterol  (PROVENTIL  HFA;VENTOLIN  HFA) 108 (90 Base) MCG/ACT inhaler Inhale 2 puffs into the lungs every 4 (four) hours as needed for wheezing or shortness of breath.   Yes [provider]  atorvastatin  (LIPITOR ) 40 MG tablet Take 40 mg by  mouth at bedtime.  05/18/19  Yes [provider]  baclofen  (LIORESAL ) 10 MG tablet Take 10 mg by mouth daily as needed for muscle spasms.    Yes [provider]  cyanocobalamin  (,VITAMIN B-12,) 1000 MCG/ML injection Inject 1 mL (1,000 mcg total) into the muscle every 30 (thirty) days. 06/03/17  Yes Levora Reyes SAUNDERS, MD  fluticasone  (FLONASE ) 50 MCG/ACT nasal spray Place 2 sprays into both nostrils daily. 06/03/17  Yes Levora Reyes SAUNDERS, MD  Galcanezumab -gnlm (EMGALITY ) 120 MG/ML SOAJ Inject 120 mg into  the skin every 30 (thirty) days. 09/12/23  Yes McCue, Harlene, NP  lansoprazole (PREVACID) 30 MG capsule Take 30 mg by mouth 2 (two) times daily.   Yes [provider]  levothyroxine  (SYNTHROID ) 125 MCG tablet Take 125 mcg by mouth daily before breakfast. 07/27/23  Yes [provider]  meloxicam (MOBIC) 15 MG tablet Take 15 mg by mouth daily as needed for pain.  01/01/20  Yes [provider]  montelukast  (SINGULAIR ) 10 MG tablet Take 10 mg by mouth daily.  06/07/19  Yes [provider]  ondansetron  (ZOFRAN ) 8 MG tablet Take 1 tablet (8 mg total) by mouth every 8 (eight) hours as needed for nausea or vomiting. 06/03/17  Yes Levora Reyes SAUNDERS, MD  RESTASIS  0.05 % ophthalmic emulsion Place 1 drop into both eyes 2 (two) times daily.   Yes [provider]  Rimegepant Sulfate (NURTEC) 75 MG TBDP Take 75 mg by mouth as needed. 10/11/22  Yes McCue, Harlene, NP  sertraline  (ZOLOFT ) 100 MG tablet Take 100 mg by mouth daily.   Yes [provider]  sucralfate  (CARAFATE ) 1 GM/10ML suspension Take 1 g by mouth in the morning, at noon, and at bedtime. 01/11/22  Yes [provider]  tapentadol  HCl (NUCYNTA ) 75 MG tablet Take 75 mg by mouth in the morning, at noon, in the evening, and at bedtime.   Yes [provider]  testosterone cypionate (DEPOTESTOSTERONE CYPIONATE) 200 MG/ML injection Inject 200 mg into the muscle every 30 (thirty) days. 12/06/19  Yes [provider]  topiramate  (TOPAMAX ) 100 MG tablet Take 1 tablet (100 mg total) by mouth at bedtime. 12/13/22  Yes McCue, Harlene, NP  traZODone  (DESYREL ) 100 MG tablet Take 100 mg by mouth at bedtime.  07/06/19  Yes [provider]  Ubrogepant  (UBRELVY ) 100 MG TABS Take 100 mg by mouth daily as needed (migaine).   Yes [provider]  clopidogrel  (PLAVIX ) 75 MG tablet Take 1 tablet (75 mg total) by mouth daily. Patient not taking: Reported on 10/06/2023 10/05/23   [place] Harlene,  NP  Tapentadol  HCl (NUCYNTA ) 100 MG TABS Take 1 tablet (100 mg total) by mouth in the morning, at noon, in the evening, and at bedtime. Patient not taking: Reported on 10/06/2023 01/03/20   Meyran, Suzen Lacks, NP   Social History   Socioeconomic History   Marital status: Married    Spouse name: Mary   Number of children: Not on file   Years of education: Not on file   Highest education level: Not on file  Occupational History   Not on file  Tobacco Use   Smoking status: Former    Current packs/day: 0.00    Types: Cigarettes    Quit date: 02/14/2001    Years since quitting: 22.6   Smokeless tobacco: Never  Vaping Use   Vaping status: Never Used  Substance and Sexual Activity   Alcohol use: No   Drug use: No  Sexual activity: Not on file  Other Topics Concern   Not on file  Social History Narrative   Lives with wife   Social Drivers of Health   Financial Resource Strain: Low Risk  (11/11/2022)   Received from [facility]   Overall Financial Resource Strain (CARDIA)    Difficulty of Paying Living Expenses: Not hard at all  Food Insecurity: No Food Insecurity (11/11/2022)   Received from [facility]   Hunger Vital Sign    Worried About Running Out of Food in the Last Year: Never true    Ran Out of Food in the Last Year: Never true  Transportation Needs: No Transportation Needs (11/11/2022)   Received from [facility], [facility]   PRAPARE - Transportation    Lack of Transportation (Medical): No    Lack of Transportation (Non-Medical): No  Physical Activity: Not on file  Stress: Not on file  Social Connections: Unknown (10/26/2022)   Received from [facility]   Social Network    Social Network: Not on file   Family History  Problem Relation Age of Onset   Breast cancer Mother    Diabetes Father    Hypertension Father    Stroke Father    Parkinson's disease Father    Brain cancer Maternal Uncle     ROS:  Currently denies lightheadedness, dizziness, Fever, chills, CP, SOB.   No personal history of DVT, PE, or MI.  + h/o 4 TIAs and 1 mini stroke in 2021 No loose teeth or dentures All other systems have been reviewed and were otherwise currently negative with the exception of those mentioned in the HPI and as above.  Objective: Vitals: Ht: 5'6 Wt: 153.8 lbs Temp: 98.4 BP: 109/72 Pulse: 85 O2 95% on room air.   Physical Exam: General: Alert, NAD.  Antalgic Gait. Frail appearing male who looks much older than his stated age HEENT: EOMI, Good Neck Extension  Pulm: No increased work of breathing.  Clear B/L A/P w/o crackle or wheeze with the exception of the left lower lung fields that have diminished breath sounds which is his normal baseline CV: RRR, No m/g/r appreciated  GI: soft, NT, ND. BS x 4 quadrants Neuro: CN II-XII grossly intact without focal deficit.  Sensation intact distally Skin: No lesions in the area of chief complaint MSK/Surgical Site:   + JLT. ROM slow and limited to 0-95 degrees.  Decreased strength in extension and flexion.  +EHL/FHL.  NVI.  Mild pain and instability with varus and valgus stress.    Imaging Review Plain radiographs demonstrate severe degenerative joint disease of the right knee.   The overall alignment ismild valgus. The bone quality appears to be fair for age and reported activity level.  Preoperative templating of the joint replacement has been completed, documented, and submitted to the Operating Room personnel in order to optimize intra-operative equipment management.  Assessment: OA RIGHT KNEE Active Problems:   * No active hospital problems. *   Plan: Plan for Procedure(s): TOTAL KNEE ARTHROPLASTY  The patient history, physical exam, clinical judgement of the provider and imaging are consistent with end stage degenerative joint disease and total joint arthroplasty is deemed medically necessary. The treatment options including medical  management, injection therapy, and arthroplasty were discussed at length. The risks and benefits of Procedure(s): TOTAL KNEE ARTHROPLASTY were presented and reviewed.  The risks of nonoperative treatment, versus surgical intervention including but not limited to continued pain, aseptic loosening, stiffness,  dislocation/subluxation, infection, bleeding, nerve injury, blood clots, cardiopulmonary complications, morbidity, mortality, among others were discussed. The patient verbalizes understanding and wishes to proceed with the plan.  Patient is being admitted for inpatient treatment for surgery, pain control, PT, prophylactic antibiotics, VTE prophylaxis, progressive ambulation, ADL's and discharge planning. He will spend the night in observation.  Dental prophylaxis discussed and recommended for 2 years postoperatively.  The patient does meet the criteria for TXA which will be used perioperatively.   Eliquis  5mg  bid will be used postoperatively for DVT prophylaxis in addition to SCDs, and early ambulation. Plan for his to continue to take his normal daily dose of Nucynta  for his chronic pain and we will provide for acute pain Tylenol  and oxycodone  for pain.   Robaxin  for muscle spasms. Hold his normal Baclofen  dose. Zofran  for nausea and vomiting that he already has enough of at home Colace and Senokot is for constipation prevention which he has at home. Pharmacy- [facility] on [address] The patient is planning to be discharged home with OPPT and into the care of his wife Ronal who can be reached at [phone] Follow up appt 11/02/23 at 3:45pm     Gerard CHRISTELLA Ted DEVONNA Office [phone] 10/10/2023 12:24 PM

## 2023-10-11 ENCOUNTER — Encounter (HOSPITAL_COMMUNITY): Payer: Medicare Other

## 2023-10-12 ENCOUNTER — Telehealth: Payer: Self-pay | Admitting: Adult Health

## 2023-10-12 DIAGNOSIS — G43119 Migraine with aura, intractable, without status migrainosus: Secondary | ICD-10-CM

## 2023-10-12 MED ORDER — EMGALITY 120 MG/ML ~~LOC~~ SOAJ: 120.0000 mg | SUBCUTANEOUS | 3 refills | Status: DC

## 2023-10-12 NOTE — Telephone Encounter (Signed)
 Did refill Emaglity pt has current appointment . LVM because wife has refill request for ubrevly however Jessica,NP only prescribes Nurtec and Emgality  . Last refill for ubrevly was from an  historic provider

## 2023-10-12 NOTE — Telephone Encounter (Signed)
 Pt's wife scheduled appt

## 2023-10-12 NOTE — Progress Notes (Signed)
DISCUSSION: Ryan Nicholson is a 63 yo male who presents to PAT prior to R TKA on 10/18/23 with Dr. Eulah Pont for knee OA. PMH of former smoker (quit 2002), TIA in 04/2019 and in 2018, migraines, GERD, s/p gastric bypass in 2005,  s/p T10-T11 fusion, chronic back pain with narcotic dependence.  Patient follows with Neurology for chronic migraines and hx of TIA. He is maintained on ASA. Last seen in clinic on 12/13/22. He could not have a MRI of this brain when he had his TIA in 2020 due to presence of spinal cord stimulator but this was removed in 2021. MRI of his brain was done in 10/2022 which showed chronic microvascular changes. He has been neurologically cleared for surgery:   "Patient with history of stroke in 2021 and on Plavix for secondary stroke prevention, has been stable from a stroke standpoint since that time.  He is currently being followed in office for migraine management and plavix is currently managed by PCP. Okay to hold plavix from a neurological standpoint for 3-5 days prior to procedure with small but acceptable risk of recurrent stroke while off therapy, recommend restarting immediately after or once hemodynamically stable."  Per patient he no longer is taking Plavix. Unclear if his PCP took him off or he self d/c'd. He does have hx of upper GIB in 12/2021.  VS: BP 122/78   Pulse 94   Temp 36.8 C (Oral)   Resp 16   Ht 5\' 7"  (1.702 m)   SpO2 99%   BMI 24.03 kg/m   PROVIDERS: Frederich Chick., MD Delia Heady, MD is neurologist  LABS: Labs reviewed: Acceptable for surgery. (all labs ordered are listed, but only abnormal results are displayed)  Labs Reviewed  CBC - Abnormal; Notable for the following components:      Result Value   RDW 16.0 (*)    All other components within normal limits  SURGICAL PCR SCREEN     IMAGES:  MRI brain 10/28/22:   IMPRESSION: This MRI of the brain with and without contrast shows the following: In the cerebral hemispheres, there are a  few small T2/FLAIR hyperintense foci in the subcortical white matter.  None of these appear to be acute.  They do not enhance.  Compared to the MRI from 12/29/2021, there were no new lesions.  These are most consistent with minimal chronic microvascular ischemic changes, stable compared to the previous MRI.  The extent is typical for age. No acute findings.  Normal enhancement pattern.  EKG:   CV: Echo 05/18/19: IMPRESSIONS   1. The left ventricle has normal systolic function with an ejection  fraction of 60-65%. The cavity size was normal. Left ventricular diastolic  parameters were normal.   2. The right ventricle has normal systolic function. The cavity was  normal. There is no increase in right ventricular wall thickness.   3. Left atrial size was mildly dilated.   4. No evidence of Right to left shunting across the intraatrial septum by  bubble contrast study.   5. No evidence of mitral valve stenosis.   6. The aorta is normal unless otherwise noted.   7. The aortic root and ascending aorta are normal in size and structure.  Past Medical History:  Diagnosis Date   B12 deficiency    Depression    Dizziness    DJD (degenerative joint disease)    Gastric ulcer    hx of   GERD (gastroesophageal reflux disease)    History  of kidney stones    Hypercholesteremia    Hypothyroidism    Migraine    Pneumonia    Post herpetic neuralgia    Stroke (HCC)    mild 2020 (weak right side), short term memory loss    Past Surgical History:  Procedure Laterality Date   BACK SURGERY  09/1999, 01/2001   CHOLECYSTECTOMY  2005   COLON SURGERY     CYSTOSCOPY W/ URETERAL STENT PLACEMENT Left 02/04/2020   Procedure: CYSTOSCOPY WITH RETROGRADE PYELOGRAM/URETERAL STENT PLACEMENT;  Surgeon: Bjorn Pippin, MD;  Location: [facility];  Service: Urology;  Laterality: Left;   EXTRACORPOREAL SHOCK WAVE LITHOTRIPSY Left 03/03/2020   Procedure: EXTRACORPOREAL SHOCK WAVE LITHOTRIPSY (ESWL);  Surgeon: Ihor Gully,  MD;  Location: [facility];  Service: Urology;  Laterality: Left;   EYE SURGERY Right    cataract removal   GASTRIC BYPASS  03/2004   HEMORRHOID SURGERY  2010   HERNIA REPAIR     KNEE SURGERY  1980, 1985, 2015   left shoulder surgery  2007   LUNG SURGERY Left 2002   PROSTATE SURGERY     SPINAL CORD STIMULATOR REMOVAL N/A 01/02/2020   Procedure: Spinal cord stimulator removal  Lumbar;  Surgeon: Tia Alert, MD;  Location: [facility] OR;  Service: Neurosurgery;  Laterality: N/A;   SPINE SURGERY     spinal stimulator   TONSILECTOMY/ADENOIDECTOMY WITH MYRINGOTOMY     TONSILLECTOMY      MEDICATIONS:  acyclovir (ZOVIRAX) 400 MG tablet   albuterol (PROVENTIL HFA;VENTOLIN HFA) 108 (90 Base) MCG/ACT inhaler   atorvastatin (LIPITOR) 40 MG tablet   baclofen (LIORESAL) 10 MG tablet   clopidogrel (PLAVIX) 75 MG tablet   cyanocobalamin (,VITAMIN B-12,) 1000 MCG/ML injection   fluticasone (FLONASE) 50 MCG/ACT nasal spray   Galcanezumab-gnlm (EMGALITY) 120 MG/ML SOAJ   lansoprazole (PREVACID) 30 MG capsule   levothyroxine (SYNTHROID) 125 MCG tablet   meloxicam (MOBIC) 15 MG tablet   montelukast (SINGULAIR) 10 MG tablet   ondansetron (ZOFRAN) 8 MG tablet   RESTASIS 0.05 % ophthalmic emulsion   Rimegepant Sulfate (NURTEC) 75 MG TBDP   sertraline (ZOLOFT) 100 MG tablet   sucralfate (CARAFATE) 1 GM/10ML suspension   Tapentadol HCl (NUCYNTA) 100 MG TABS   tapentadol HCl (NUCYNTA) 75 MG tablet   testosterone cypionate (DEPOTESTOSTERONE CYPIONATE) 200 MG/ML injection   topiramate (TOPAMAX) 100 MG tablet   traZODone (DESYREL) 100 MG tablet   Ubrogepant (UBRELVY) 100 MG TABS   No current facility-administered medications for this encounter.   Marcille Blanco [facility] Surgical Short Stay/Anesthesiology [facility] Phone [phone] 10/13/2023 8:32 AM

## 2023-10-12 NOTE — Telephone Encounter (Signed)
 P's wife requesting refills of Ubrogepant  (UBRELVY ) 100 MG TABS and Galcanezumab -gnlm (EMGALITY ) 120 MG/ML SOAJ Send to [facility] [phone]

## 2023-10-13 NOTE — Anesthesia Preprocedure Evaluation (Addendum)
Anesthesia Evaluation  Patient identified by MRN, date of birth, ID band Patient awake    Reviewed: Allergy & Precautions, NPO status , Patient's Chart, lab work & pertinent test results  Airway Mallampati: II  TM Distance: >3 FB Neck ROM: Full    Dental no notable dental hx. (+) Missing, Loose   Pulmonary former smoker   Pulmonary exam normal breath sounds clear to auscultation       Cardiovascular Normal cardiovascular exam Rhythm:Regular Rate:Normal  Ef 60-65%   Neuro/Psych TIA (04/2019)   GI/Hepatic ,GERD  ,,  Endo/Other  Hypothyroidism    Renal/GU      Musculoskeletal  (+) Arthritis ,  Back surgery   Abdominal   Peds  Hematology Lab Results      Component                Value               Date                      WBC                      7.2                 10/10/2023                HGB                      14.2                10/10/2023                HCT                      44.7                10/10/2023                MCV                      93.9                10/10/2023                PLT                      266                 10/10/2023           On plavix   Anesthesia Other Findings   Reproductive/Obstetrics                             Anesthesia Physical Anesthesia Plan  ASA: 2  Anesthesia Plan: Spinal and Regional   Post-op Pain Management: Minimal or no pain anticipated and Regional block*   Induction:   PONV Risk Score and Plan: 2 and Treatment may vary due to age or medical condition, Ondansetron, Midazolam and Propofol infusion  Airway Management Planned: Natural Airway and Simple Face Mask  Additional Equipment: None  Intra-op Plan:   Post-operative Plan:   Informed Consent: I have reviewed the patients History and Physical, chart, labs and discussed the procedure including the risks, benefits and alternatives for the proposed anesthesia with the  patient or authorized representative who has  indicated his/her understanding and acceptance.     Dental advisory given  Plan Discussed with: CRNA and Anesthesiologist  Anesthesia Plan Comments: (See PAT note from 1/13 by Sherlie Ban PA-C  SP w R adductor     )        Anesthesia Quick Evaluation

## 2023-10-18 ENCOUNTER — Ambulatory Visit (HOSPITAL_COMMUNITY): Payer: Medicare Other

## 2023-10-18 ENCOUNTER — Observation Stay (HOSPITAL_COMMUNITY)
Admission: RE | Admit: 2023-10-18 | Discharge: 2023-10-20 | Disposition: A | Discharge status: Home / Self Care | Payer: Medicare Other | Source: Ambulatory Visit | Attending: Orthopedic Surgery | Admitting: Orthopedic Surgery

## 2023-10-18 ENCOUNTER — Encounter (HOSPITAL_COMMUNITY)
Admission: RE | Disposition: A | Discharge status: Home / Self Care | Payer: Self-pay | Source: Ambulatory Visit | Attending: Orthopedic Surgery

## 2023-10-18 ENCOUNTER — Ambulatory Visit (HOSPITAL_COMMUNITY): Payer: Medicare Other | Admitting: Medical

## 2023-10-18 ENCOUNTER — Other Ambulatory Visit: Payer: Self-pay

## 2023-10-18 ENCOUNTER — Encounter (HOSPITAL_COMMUNITY): Payer: Self-pay | Admitting: Orthopedic Surgery

## 2023-10-18 ENCOUNTER — Ambulatory Visit (HOSPITAL_COMMUNITY): Payer: Medicare Other | Admitting: Anesthesiology

## 2023-10-18 DIAGNOSIS — M1711 Unilateral primary osteoarthritis, right knee: Principal | ICD-10-CM | POA: Insufficient documentation

## 2023-10-18 DIAGNOSIS — Z96651 Presence of right artificial knee joint: Principal | ICD-10-CM

## 2023-10-18 DIAGNOSIS — Z8673 Personal history of transient ischemic attack (TIA), and cerebral infarction without residual deficits: Secondary | ICD-10-CM | POA: Insufficient documentation

## 2023-10-18 DIAGNOSIS — Z79899 Other long term (current) drug therapy: Secondary | ICD-10-CM | POA: Insufficient documentation

## 2023-10-18 DIAGNOSIS — Z87891 Personal history of nicotine dependence: Secondary | ICD-10-CM | POA: Diagnosis not present

## 2023-10-18 HISTORY — PX: TOTAL KNEE ARTHROPLASTY: SHX125

## 2023-10-18 SURGERY — ARTHROPLASTY, KNEE, TOTAL
Anesthesia: Regional | Site: Knee | Laterality: Right

## 2023-10-18 MED ORDER — ALBUTEROL SULFATE HFA 108 (90 BASE) MCG/ACT IN AERS: 2.0000 | INHALATION_SPRAY | RESPIRATORY_TRACT | Status: DC | PRN

## 2023-10-18 MED ORDER — POVIDONE-IODINE 10 % EX SWAB: 2.0000 | Freq: Once | CUTANEOUS | Status: DC

## 2023-10-18 MED ORDER — BUPIVACAINE LIPOSOME 1.3 % IJ SUSP: 20.0000 mL | Freq: Once | INTRAMUSCULAR | Status: DC

## 2023-10-18 MED ORDER — SODIUM CHLORIDE (PF) 0.9 % IJ SOLN
INTRAMUSCULAR | Status: AC
  Filled 2023-10-18: qty 50

## 2023-10-18 MED ORDER — TRAZODONE HCL 100 MG PO TABS
100.0000 mg | ORAL_TABLET | Freq: Every day | ORAL | Status: DC
Start: 2023-10-18 — End: 2023-10-20
  Administered 2023-10-18 – 2023-10-19 (×2): 100 mg via ORAL
  Filled 2023-10-18 (×2): qty 1

## 2023-10-18 MED ORDER — AMISULPRIDE (ANTIEMETIC) 5 MG/2ML IV SOLN: 10.0000 mg | Freq: Once | INTRAVENOUS | Status: DC | PRN

## 2023-10-18 MED ORDER — DIPHENHYDRAMINE HCL 12.5 MG/5ML PO ELIX
12.5000 mg | ORAL_SOLUTION | ORAL | Status: DC | PRN
Start: 2023-10-18 — End: 2023-10-20

## 2023-10-18 MED ORDER — CEFAZOLIN SODIUM-DEXTROSE 2-4 GM/100ML-% IV SOLN
2.0000 g | Freq: Four times a day (QID) | INTRAVENOUS | Status: AC
  Administered 2023-10-18 – 2023-10-19 (×2): 2 g via INTRAVENOUS
  Filled 2023-10-18 (×2): qty 100

## 2023-10-18 MED ORDER — TOPIRAMATE 100 MG PO TABS
100.0000 mg | ORAL_TABLET | Freq: Every day | ORAL | Status: DC
  Administered 2023-10-18 – 2023-10-19 (×2): 100 mg via ORAL
  Filled 2023-10-18 (×2): qty 1

## 2023-10-18 MED ORDER — OXYCODONE HCL 5 MG PO TABS
ORAL_TABLET | ORAL | Status: AC
  Filled 2023-10-18: qty 2

## 2023-10-18 MED ORDER — METHOCARBAMOL 500 MG PO TABS
500.0000 mg | ORAL_TABLET | Freq: Four times a day (QID) | ORAL | Status: DC | PRN
  Administered 2023-10-18 – 2023-10-20 (×5): 500 mg via ORAL
  Filled 2023-10-18 (×5): qty 1

## 2023-10-18 MED ORDER — BISACODYL 10 MG RE SUPP: 10.0000 mg | Freq: Every day | RECTAL | Status: DC | PRN

## 2023-10-18 MED ORDER — ACETAMINOPHEN 325 MG PO TABS: 325.0000 mg | ORAL_TABLET | Freq: Four times a day (QID) | ORAL | Status: DC | PRN

## 2023-10-18 MED ORDER — PROPOFOL 500 MG/50ML IV EMUL
INTRAVENOUS | Status: DC | PRN
  Administered 2023-10-18: 60 ug/kg/min via INTRAVENOUS

## 2023-10-18 MED ORDER — MENTHOL 3 MG MT LOZG
1.0000 | LOZENGE | OROMUCOSAL | Status: DC | PRN
Start: 2023-10-18 — End: 2023-10-20

## 2023-10-18 MED ORDER — HYDROMORPHONE HCL 1 MG/ML IJ SOLN
0.5000 mg | INTRAMUSCULAR | Status: DC | PRN
  Administered 2023-10-18 – 2023-10-20 (×5): 1 mg via INTRAVENOUS
  Filled 2023-10-18 (×5): qty 1

## 2023-10-18 MED ORDER — METHOCARBAMOL 1000 MG/10ML IJ SOLN
500.0000 mg | Freq: Four times a day (QID) | INTRAMUSCULAR | Status: DC | PRN
Start: 2023-10-18 — End: 2023-10-20

## 2023-10-18 MED ORDER — BUPIVACAINE IN DEXTROSE 0.75-8.25 % IT SOLN
INTRATHECAL | Status: DC | PRN
  Administered 2023-10-18: 1.7 mL via INTRATHECAL

## 2023-10-18 MED ORDER — PROPOFOL 10 MG/ML IV BOLUS
INTRAVENOUS | Status: DC | PRN
  Administered 2023-10-18: 40 mg via INTRAVENOUS
  Administered 2023-10-18: 10 mg via INTRAVENOUS
  Administered 2023-10-18: 40 mg via INTRAVENOUS
  Administered 2023-10-18: 20 mg via INTRAVENOUS
  Administered 2023-10-18 (×2): 30 mg via INTRAVENOUS

## 2023-10-18 MED ORDER — CLONIDINE HCL (ANALGESIA) 100 MCG/ML EP SOLN
EPIDURAL | Status: DC | PRN
  Administered 2023-10-18: 100 ug

## 2023-10-18 MED ORDER — CYCLOSPORINE 0.05 % OP EMUL
1.0000 [drp] | Freq: Two times a day (BID) | OPHTHALMIC | Status: DC
  Administered 2023-10-18 – 2023-10-20 (×4): 1 [drp] via OPHTHALMIC
  Filled 2023-10-18 (×5): qty 30

## 2023-10-18 MED ORDER — DEXAMETHASONE SODIUM PHOSPHATE 10 MG/ML IJ SOLN
INTRAMUSCULAR | Status: AC
  Filled 2023-10-18: qty 1

## 2023-10-18 MED ORDER — DOCUSATE SODIUM 100 MG PO CAPS
100.0000 mg | ORAL_CAPSULE | Freq: Two times a day (BID) | ORAL | Status: DC
  Administered 2023-10-18 – 2023-10-20 (×4): 100 mg via ORAL
  Filled 2023-10-18 (×4): qty 1

## 2023-10-18 MED ORDER — PANTOPRAZOLE SODIUM 40 MG PO TBEC
40.0000 mg | DELAYED_RELEASE_TABLET | Freq: Every day | ORAL | Status: DC
  Administered 2023-10-19 – 2023-10-20 (×2): 40 mg via ORAL
  Filled 2023-10-18 (×2): qty 1

## 2023-10-18 MED ORDER — WATER FOR IRRIGATION, STERILE IR SOLN
Status: DC | PRN
  Administered 2023-10-18: 1000 mL

## 2023-10-18 MED ORDER — ACETAMINOPHEN 10 MG/ML IV SOLN
1000.0000 mg | Freq: Once | INTRAVENOUS | Status: DC | PRN
  Administered 2023-10-18: 1000 mg via INTRAVENOUS

## 2023-10-18 MED ORDER — ALBUTEROL SULFATE (2.5 MG/3ML) 0.083% IN NEBU: 2.5000 mg | INHALATION_SOLUTION | Freq: Four times a day (QID) | RESPIRATORY_TRACT | Status: DC | PRN

## 2023-10-18 MED ORDER — FENTANYL CITRATE PF 50 MCG/ML IJ SOSY
50.0000 ug | PREFILLED_SYRINGE | INTRAMUSCULAR | Status: AC
Start: 2023-10-18 — End: 2023-10-18

## 2023-10-18 MED ORDER — ACYCLOVIR 400 MG PO TABS
400.0000 mg | ORAL_TABLET | Freq: Every day | ORAL | Status: DC
  Administered 2023-10-18 – 2023-10-20 (×3): 400 mg via ORAL
  Filled 2023-10-18 (×3): qty 1

## 2023-10-18 MED ORDER — BUPIVACAINE-EPINEPHRINE 0.25% -1:200000 IJ SOLN
INTRAMUSCULAR | Status: DC | PRN
  Administered 2023-10-18: 30 mL

## 2023-10-18 MED ORDER — EPHEDRINE SULFATE (PRESSORS) 50 MG/ML IJ SOLN
INTRAMUSCULAR | Status: DC | PRN
  Administered 2023-10-18 (×2): 5 mg via INTRAVENOUS

## 2023-10-18 MED ORDER — LEVOTHYROXINE SODIUM 125 MCG PO TABS
125.0000 ug | ORAL_TABLET | Freq: Every day | ORAL | Status: DC
  Administered 2023-10-19 – 2023-10-20 (×2): 125 ug via ORAL
  Filled 2023-10-18 (×2): qty 1

## 2023-10-18 MED ORDER — TRANEXAMIC ACID-NACL 1000-0.7 MG/100ML-% IV SOLN
1000.0000 mg | Freq: Once | INTRAVENOUS | Status: AC
  Administered 2023-10-18: 1000 mg via INTRAVENOUS
  Filled 2023-10-18: qty 100

## 2023-10-18 MED ORDER — ORAL CARE MOUTH RINSE: 15.0000 mL | Freq: Once | OROMUCOSAL | Status: AC

## 2023-10-18 MED ORDER — BUPIVACAINE LIPOSOME 1.3 % IJ SUSP
INTRAMUSCULAR | Status: DC | PRN
  Administered 2023-10-18: 20 mL

## 2023-10-18 MED ORDER — TAPENTADOL HCL 50 MG PO TABS
75.0000 mg | ORAL_TABLET | Freq: Four times a day (QID) | ORAL | Status: DC
  Administered 2023-10-18 – 2023-10-20 (×7): 75 mg via ORAL
  Filled 2023-10-18 (×7): qty 2

## 2023-10-18 MED ORDER — ONDANSETRON HCL 4 MG/2ML IJ SOLN: 4.0000 mg | Freq: Four times a day (QID) | INTRAMUSCULAR | Status: DC | PRN

## 2023-10-18 MED ORDER — ONDANSETRON HCL 4 MG/2ML IJ SOLN: 4.0000 mg | Freq: Once | INTRAMUSCULAR | Status: DC | PRN

## 2023-10-18 MED ORDER — MAGNESIUM CITRATE PO SOLN: 1.0000 | Freq: Once | ORAL | Status: DC | PRN

## 2023-10-18 MED ORDER — ALUM & MAG HYDROXIDE-SIMETH 200-200-20 MG/5ML PO SUSP: 30.0000 mL | ORAL | Status: DC | PRN

## 2023-10-18 MED ORDER — BUPIVACAINE-EPINEPHRINE 0.25% -1:200000 IJ SOLN
INTRAMUSCULAR | Status: AC
  Filled 2023-10-18: qty 1

## 2023-10-18 MED ORDER — ONDANSETRON HCL 4 MG/2ML IJ SOLN
INTRAMUSCULAR | Status: DC | PRN
  Administered 2023-10-18: 4 mg via INTRAVENOUS

## 2023-10-18 MED ORDER — CHLORHEXIDINE GLUCONATE 0.12 % MT SOLN
15.0000 mL | Freq: Once | OROMUCOSAL | Status: AC
  Administered 2023-10-18: 15 mL via OROMUCOSAL

## 2023-10-18 MED ORDER — 0.9 % SODIUM CHLORIDE (POUR BTL) OPTIME
TOPICAL | Status: DC | PRN
  Administered 2023-10-18: 1000 mL

## 2023-10-18 MED ORDER — SUCRALFATE 1 GM/10ML PO SUSP
1.0000 g | Freq: Three times a day (TID) | ORAL | Status: DC
  Administered 2023-10-19 – 2023-10-20 (×5): 1 g via ORAL
  Filled 2023-10-18 (×5): qty 10

## 2023-10-18 MED ORDER — ATORVASTATIN CALCIUM 40 MG PO TABS
40.0000 mg | ORAL_TABLET | Freq: Every day | ORAL | Status: DC
  Administered 2023-10-18 – 2023-10-19 (×2): 40 mg via ORAL
  Filled 2023-10-18 (×2): qty 1

## 2023-10-18 MED ORDER — SERTRALINE HCL 100 MG PO TABS
100.0000 mg | ORAL_TABLET | Freq: Every day | ORAL | Status: DC
  Administered 2023-10-18 – 2023-10-20 (×3): 100 mg via ORAL
  Filled 2023-10-18 (×3): qty 1

## 2023-10-18 MED ORDER — TRANEXAMIC ACID-NACL 1000-0.7 MG/100ML-% IV SOLN
INTRAVENOUS | Status: AC
  Filled 2023-10-18: qty 100

## 2023-10-18 MED ORDER — ACETAMINOPHEN 500 MG PO TABS
1000.0000 mg | ORAL_TABLET | Freq: Once | ORAL | Status: AC
  Administered 2023-10-18: 1000 mg via ORAL
  Filled 2023-10-18: qty 2

## 2023-10-18 MED ORDER — ONDANSETRON HCL 4 MG/2ML IJ SOLN
INTRAMUSCULAR | Status: AC
Start: 2023-10-18 — End: ?
  Filled 2023-10-18: qty 2

## 2023-10-18 MED ORDER — OXYCODONE HCL 5 MG PO TABS: 5.0000 mg | ORAL_TABLET | Freq: Once | ORAL | Status: DC | PRN

## 2023-10-18 MED ORDER — HYDROMORPHONE HCL 1 MG/ML IJ SOLN
INTRAMUSCULAR | Status: AC
  Administered 2023-10-18: 0.5 mg via INTRAVENOUS
  Filled 2023-10-18: qty 1

## 2023-10-18 MED ORDER — DEXAMETHASONE SODIUM PHOSPHATE 10 MG/ML IJ SOLN
8.0000 mg | Freq: Once | INTRAMUSCULAR | Status: AC
  Administered 2023-10-18: 8 mg via INTRAVENOUS

## 2023-10-18 MED ORDER — CEFAZOLIN SODIUM-DEXTROSE 2-4 GM/100ML-% IV SOLN
INTRAVENOUS | Status: AC
  Filled 2023-10-18: qty 100

## 2023-10-18 MED ORDER — EPHEDRINE 5 MG/ML INJ
INTRAVENOUS | Status: AC
  Filled 2023-10-18: qty 5

## 2023-10-18 MED ORDER — FENTANYL CITRATE PF 50 MCG/ML IJ SOSY
PREFILLED_SYRINGE | INTRAMUSCULAR | Status: AC
  Administered 2023-10-18: 50 ug via INTRAVENOUS
  Filled 2023-10-18: qty 1

## 2023-10-18 MED ORDER — APIXABAN 2.5 MG PO TABS
2.5000 mg | ORAL_TABLET | Freq: Two times a day (BID) | ORAL | Status: DC
Start: 2023-10-19 — End: 2023-10-20
  Administered 2023-10-19 – 2023-10-20 (×3): 2.5 mg via ORAL
  Filled 2023-10-18 (×3): qty 1

## 2023-10-18 MED ORDER — SODIUM CHLORIDE 0.9% FLUSH
INTRAVENOUS | Status: DC | PRN
  Administered 2023-10-18: 30 mL

## 2023-10-18 MED ORDER — HYDROMORPHONE HCL 1 MG/ML IJ SOLN
INTRAMUSCULAR | Status: AC
  Filled 2023-10-18: qty 1

## 2023-10-18 MED ORDER — OXYCODONE HCL 5 MG PO TABS
5.0000 mg | ORAL_TABLET | ORAL | Status: DC | PRN
  Administered 2023-10-19: 5 mg via ORAL
  Filled 2023-10-18: qty 1

## 2023-10-18 MED ORDER — OXYCODONE HCL 5 MG/5ML PO SOLN: 5.0000 mg | Freq: Once | ORAL | Status: DC | PRN

## 2023-10-18 MED ORDER — LACTATED RINGERS IV SOLN: INTRAVENOUS | Status: DC

## 2023-10-18 MED ORDER — PHENOL 1.4 % MT LIQD: 1.0000 | OROMUCOSAL | Status: DC | PRN

## 2023-10-18 MED ORDER — OXYCODONE HCL 5 MG PO TABS
10.0000 mg | ORAL_TABLET | ORAL | Status: DC | PRN
  Administered 2023-10-18 – 2023-10-19 (×3): 10 mg via ORAL
  Administered 2023-10-19 (×2): 15 mg via ORAL
  Administered 2023-10-19: 10 mg via ORAL
  Administered 2023-10-20 (×4): 15 mg via ORAL
  Filled 2023-10-18 (×2): qty 3
  Filled 2023-10-18: qty 2
  Filled 2023-10-18: qty 3
  Filled 2023-10-18 (×2): qty 2
  Filled 2023-10-18 (×3): qty 3

## 2023-10-18 MED ORDER — MIDAZOLAM HCL 5 MG/5ML IJ SOLN: INTRAMUSCULAR | Status: DC | PRN

## 2023-10-18 MED ORDER — ROPIVACAINE HCL 5 MG/ML IJ SOLN
INTRAMUSCULAR | Status: DC | PRN
  Administered 2023-10-18: 30 mL via PERINEURAL

## 2023-10-18 MED ORDER — CEFAZOLIN SODIUM-DEXTROSE 2-4 GM/100ML-% IV SOLN
2.0000 g | INTRAVENOUS | Status: AC
  Administered 2023-10-18: 2 g via INTRAVENOUS
  Filled 2023-10-18: qty 100

## 2023-10-18 MED ORDER — POLYETHYLENE GLYCOL 3350 17 G PO PACK: 17.0000 g | PACK | Freq: Every day | ORAL | Status: DC | PRN

## 2023-10-18 MED ORDER — DEXAMETHASONE SODIUM PHOSPHATE 10 MG/ML IJ SOLN
10.0000 mg | Freq: Once | INTRAMUSCULAR | Status: AC
  Administered 2023-10-19: 10 mg via INTRAVENOUS
  Filled 2023-10-18: qty 1

## 2023-10-18 MED ORDER — TRANEXAMIC ACID-NACL 1000-0.7 MG/100ML-% IV SOLN
1000.0000 mg | INTRAVENOUS | Status: AC
  Administered 2023-10-18: 1000 mg via INTRAVENOUS
  Filled 2023-10-18: qty 100

## 2023-10-18 MED ORDER — METHOCARBAMOL 500 MG PO TABS
ORAL_TABLET | ORAL | Status: AC
  Administered 2023-10-18: 500 mg via ORAL
  Filled 2023-10-18: qty 1

## 2023-10-18 MED ORDER — LIDOCAINE HCL (PF) 2 % IJ SOLN
INTRAMUSCULAR | Status: AC
  Filled 2023-10-18: qty 5

## 2023-10-18 MED ORDER — ACETAMINOPHEN 10 MG/ML IV SOLN
INTRAVENOUS | Status: AC
  Filled 2023-10-18: qty 100

## 2023-10-18 MED ORDER — ACETAMINOPHEN 500 MG PO TABS
1000.0000 mg | ORAL_TABLET | Freq: Four times a day (QID) | ORAL | Status: AC
  Administered 2023-10-18 – 2023-10-19 (×3): 1000 mg via ORAL
  Filled 2023-10-18 (×3): qty 2

## 2023-10-18 MED ORDER — ONDANSETRON HCL 4 MG PO TABS: 4.0000 mg | ORAL_TABLET | Freq: Four times a day (QID) | ORAL | Status: DC | PRN

## 2023-10-18 MED ORDER — BUPIVACAINE LIPOSOME 1.3 % IJ SUSP
INTRAMUSCULAR | Status: AC
  Filled 2023-10-18: qty 20

## 2023-10-18 MED ORDER — MONTELUKAST SODIUM 10 MG PO TABS
10.0000 mg | ORAL_TABLET | Freq: Every day | ORAL | Status: DC
  Administered 2023-10-18 – 2023-10-20 (×3): 10 mg via ORAL
  Filled 2023-10-18 (×3): qty 1

## 2023-10-18 MED ORDER — HYDROMORPHONE HCL 1 MG/ML IJ SOLN
0.2500 mg | INTRAMUSCULAR | Status: DC | PRN
Start: 2023-10-18 — End: 2023-10-18
  Administered 2023-10-18 (×2): 0.5 mg via INTRAVENOUS

## 2023-10-18 MED ORDER — SODIUM CHLORIDE 0.9 % IR SOLN
Status: DC | PRN
  Administered 2023-10-18: 1000 mL

## 2023-10-18 MED ORDER — PROPOFOL 1000 MG/100ML IV EMUL
INTRAVENOUS | Status: AC
  Filled 2023-10-18: qty 100

## 2023-10-18 SURGICAL SUPPLY — 46 items
BAG COUNTER SPONGE SURGICOUNT (BAG) IMPLANT
BLADE SAG 18X100X1.27 (BLADE) ×1 IMPLANT
BLADE SAGITTAL 25.0X1.37X90 (BLADE) ×1 IMPLANT
BLADE SURG 15 STRL LF DISP TIS (BLADE) ×1 IMPLANT
BNDG ELASTIC 6X10 VLCR STRL LF (GAUZE/BANDAGES/DRESSINGS) ×1 IMPLANT
BOWL SMART MIX CTS (DISPOSABLE) IMPLANT
CLSR STERI-STRIP ANTIMIC 1/2X4 (GAUZE/BANDAGES/DRESSINGS) ×2 IMPLANT
COMPONENT TRI CR RETAIN SZ6 RT (Miscellaneous) IMPLANT
COVER SURGICAL LIGHT HANDLE (MISCELLANEOUS) ×1 IMPLANT
CUFF TRNQT CYL 34X4.125X (TOURNIQUET CUFF) ×1 IMPLANT
DRAPE U-SHAPE 47X51 STRL (DRAPES) ×1 IMPLANT
DRSG MEPILEX POST OP 4X12 (GAUZE/BANDAGES/DRESSINGS) ×1 IMPLANT
DRSG MEPILEX POST OP 4X8 (GAUZE/BANDAGES/DRESSINGS) IMPLANT
DURAPREP 26ML APPLICATOR (WOUND CARE) ×2 IMPLANT
ELECT REM PT RETURN 15FT ADLT (MISCELLANEOUS) ×1 IMPLANT
GLOVE BIO SURGEON STRL SZ7.5 (GLOVE) ×2 IMPLANT
GLOVE BIOGEL PI IND STRL 7.5 (GLOVE) ×1 IMPLANT
GLOVE BIOGEL PI IND STRL 8 (GLOVE) ×1 IMPLANT
GLOVE SURG SYN 7.0 (GLOVE) ×1
GLOVE SURG SYN 7.0 PF PI (GLOVE) ×1 IMPLANT
GOWN STRL REUS W/ TWL LRG LVL3 (GOWN DISPOSABLE) ×1 IMPLANT
GOWN STRL REUS W/ TWL XL LVL3 (GOWN DISPOSABLE) ×1 IMPLANT
HOLDER FOLEY CATH W/STRAP (MISCELLANEOUS) IMPLANT
IMMOBILIZER KNEE 20 (SOFTGOODS) ×1
IMMOBILIZER KNEE 20 THIGH 36 (SOFTGOODS) IMPLANT
IMMOBILIZER KNEE 22 UNIV (SOFTGOODS) IMPLANT
INSERT TRIATHLON CS TIB X3 9 (Joint) IMPLANT
KIT TURNOVER KIT A (KITS) IMPLANT
KNEE PATELLA ASYMMETRIC 10X35 (Knees) IMPLANT
KNEE TIBIAL COMPONENT SZ6 (Knees) IMPLANT
MANIFOLD NEPTUNE II (INSTRUMENTS) ×1 IMPLANT
NS IRRIG 1000ML POUR BTL (IV SOLUTION) ×1 IMPLANT
PACK TOTAL KNEE CUSTOM (KITS) ×1 IMPLANT
PIN FLUTED HEDLESS FIX 3.5X1/8 (PIN) IMPLANT
PROTECTOR NERVE ULNAR (MISCELLANEOUS) ×1 IMPLANT
SET HNDPC FAN SPRY TIP SCT (DISPOSABLE) ×1 IMPLANT
SPIKE FLUID TRANSFER (MISCELLANEOUS) ×1 IMPLANT
SUT MNCRL AB 3-0 PS2 18 (SUTURE) ×1 IMPLANT
SUT VIC AB 0 CT1 36 (SUTURE) ×1 IMPLANT
SUT VIC AB 1 CT1 36 (SUTURE) ×1 IMPLANT
SUT VIC AB 2-0 CT1 TAPERPNT 27 (SUTURE) ×1 IMPLANT
TOWEL GREEN STERILE FF (TOWEL DISPOSABLE) ×1 IMPLANT
TRAY FOLEY MTR SLVR 16FR STAT (SET/KITS/TRAYS/PACK) IMPLANT
TRIATHLON CRUCIATE RETAIN SZ6 (Miscellaneous) ×1 IMPLANT
TUBE SUCTION HIGH CAP CLEAR NV (SUCTIONS) ×1 IMPLANT
WRAP KNEE MAXI GEL POST OP (GAUZE/BANDAGES/DRESSINGS) ×1 IMPLANT

## 2023-10-18 NOTE — Interval H&P Note (Signed)
History and Physical Interval Note:  10/18/2023 12:04 PM  Ryan Nicholson  has presented today for surgery, with the diagnosis of OA RIGHT KNEE.  The various methods of treatment have been discussed with the patient and family. After consideration of risks, benefits and other options for treatment, the patient has consented to  Procedure(s): TOTAL KNEE ARTHROPLASTY (Right) as a surgical intervention.  The patient's history has been reviewed, patient examined, no change in status, stable for surgery.  I have reviewed the patient's chart and labs.  Questions were answered to the patient's satisfaction.     Sheral Apley

## 2023-10-18 NOTE — Transfer of Care (Signed)
Immediate Anesthesia Transfer of Care Note  Patient: Ryan Nicholson  Procedure(s) Performed: TOTAL KNEE ARTHROPLASTY (Right: Knee)  Patient Location: PACU  Anesthesia Type:MAC and Spinal  Level of Consciousness: awake, alert , oriented, and patient cooperative  Airway & Oxygen Therapy: Patient Spontanous Breathing and Patient connected to face mask oxygen  Post-op Assessment: Report given to RN and Post -op Vital signs reviewed and stable  Post vital signs: Reviewed and stable  Last Vitals:  Vitals Value Taken Time  BP 105/49 10/18/23 1458  Temp    Pulse 68 10/18/23 1500  Resp 15 10/18/23 1500  SpO2 99 % 10/18/23 1500  Vitals shown include unfiled device data.  Last Pain:  Vitals:   10/18/23 1150  TempSrc: Oral  PainSc:          Complications: No notable events documented.

## 2023-10-18 NOTE — Anesthesia Procedure Notes (Signed)
Spinal  Patient location during procedure: OB Start time: 10/18/2023 1:09 PM End time: 10/18/2023 1:14 PM Reason for block: surgical anesthesia Staffing Performed: anesthesiologist  Anesthesiologist: Trevor Iha, MD Performed by: Trevor Iha, MD Authorized by: Trevor Iha, MD   Preanesthetic Checklist Completed: patient identified, IV checked, risks and benefits discussed, surgical consent, monitors and equipment checked, pre-op evaluation and timeout performed Spinal Block Patient position: sitting Prep: DuraPrep and site prepped and draped Patient monitoring: heart rate, cardiac monitor, continuous pulse ox and blood pressure Approach: midline Location: L3-4 Injection technique: single-shot Needle Needle type: Pencan  Needle gauge: 24 G Needle length: 10 cm Needle insertion depth: 6 cm Assessment Sensory level: T4 Events: CSF return Additional Notes 1 Attempt (s). Pt tolerated procedure well.

## 2023-10-18 NOTE — Plan of Care (Signed)
  Problem: Activity: Goal: Risk for activity intolerance will decrease Outcome: Progressing   Problem: Nutrition: Goal: Adequate nutrition will be maintained Outcome: Progressing   Problem: Safety: Goal: Ability to remain free from injury will improve Outcome: Progressing   Problem: Pain Managment: Goal: General experience of comfort will improve and/or be controlled Outcome: Progressing

## 2023-10-18 NOTE — Interval H&P Note (Signed)
History and Physical Interval Note:  10/18/2023 10:26 AM  Ryan Nicholson  has presented today for surgery, with the diagnosis of OA RIGHT KNEE.  The various methods of treatment have been discussed with the patient and family. After consideration of risks, benefits and other options for treatment, the patient has consented to  Procedure(s): TOTAL KNEE ARTHROPLASTY (Right) as a surgical intervention.  The patient's history has been reviewed, patient examined, no change in status, stable for surgery.  I have reviewed the patient's chart and labs.  Questions were answered to the patient's satisfaction.     Sheral Apley

## 2023-10-18 NOTE — Progress Notes (Signed)
Orthopedic Tech Progress Note Patient Details:  Ryan Nicholson 12/07/1960 [id number]  CPM Right Knee CPM Right Knee: On Right Knee Flexion (Degrees): 90 Right Knee Extension (Degrees): 0  Post Interventions Patient Tolerated: Well  [name] 10/18/2023, 6:03 PM

## 2023-10-18 NOTE — Op Note (Signed)
DATE OF SURGERY:  10/18/2023 TIME: 2:16 PM  PATIENT NAME:  Ryan Nicholson   AGE: 63 y.o.    PRE-OPERATIVE DIAGNOSIS:  OA RIGHT KNEE  POST-OPERATIVE DIAGNOSIS:  Same  PROCEDURE:  Procedure(s): TOTAL KNEE ARTHROPLASTY   SURGEON:  Sheral Apley, MD   ASSISTANT:  Levester Fresh, PA-C, she was present and scrubbed throughout the case, critical for completion in a timely fashion, and for retraction, instrumentation, and closure.    OPERATIVE IMPLANTS: [employer]ker Triathlon CR. Press fit knee  Femur size 6, Tibia size 6, Patella size 35 3-peg oval button, with a 9 mm polyethylene insert.   PREOPERATIVE INDICATIONS:  [name] is a 63 y.o. year old male with end stage bone on bone degenerative arthritis of the knee who failed conservative treatment, including injections, antiinflammatories, activity modification, and assistive devices, and had significant impairment of their activities of daily living, and elected for Total Knee Arthroplasty.   The risks, benefits, and alternatives were discussed at length including but not limited to the risks of infection, bleeding, nerve injury, stiffness, blood clots, the need for revision surgery, cardiopulmonary complications, among others, and they were willing to proceed.   OPERATIVE DESCRIPTION:  The patient was brought to the operative room and placed in a supine position.  General anesthesia was administered.  IV antibiotics were given.  The lower extremity was prepped and draped in the usual sterile fashion.  Time out was performed.  The leg was elevated and exsanguinated and the tourniquet was inflated.  Anterior approach was performed.  The patella was everted and osteophytes were removed.  The anterior horn of the medial and lateral meniscus was removed.   The distal femur was opened with the drill and the intramedullary distal femoral cutting jig was utilized, set at 5 degrees resecting 9 mm off the distal femur.  Care was taken to  protect the collateral ligaments.  The distal femoral sizing jig was applied, taking care to avoid notching.  Then the 4-in-1 cutting jig was applied and the anterior and posterior femur was cut, along with the chamfer cuts.  All posterior osteophytes were removed.  The flexion gap was then measured and was symmetric with the extension gap.  Then the extramedullary tibial cutting jig was utilized making the appropriate cut using the anterior tibial crest as a reference building in appropriate posterior slope.  Care was taken during the cut to protect the medial and collateral ligaments.  The proximal tibia was removed along with the posterior horns of the menisci.    I completed the distal femoral preparation using the appropriate jig to prepare the box.  The patella was then measured, and cut with the saw.    The proximal tibia sized and prepared accordingly with the reamer and the punch, and then all components were trialed with the above sized poly insert.  The knee was found to have excellent balance and full motion.    The above named components were then impacted into place and Poly tibial piece and patella were inserted.  I was very happy with his stability and ROM  I performed a periarticular injection with Exparel  The knee was easily taken through a range of motion and the patella tracked well and the knee irrigated copiously and the parapatellar and subcutaneous tissue closed with vicryl, and monocryl with steri strips for the skin.  The incision was dressed with sterile gauze and the tourniquet released and the patient was awakened and returned to the  PACU in stable and satisfactory condition.  There were no complications.  Total tourniquet time was roughly 60 minutes.   POSTOPERATIVE PLAN: post op Abx, DVT px: SCD's, TED's, Early ambulation and chemical px

## 2023-10-18 NOTE — Anesthesia Postprocedure Evaluation (Signed)
Anesthesia Post Note  Patient: Ryan Nicholson  Procedure(s) Performed: TOTAL KNEE ARTHROPLASTY (Right: Knee)     Patient location during evaluation: Nursing Unit Anesthesia Type: Regional and Spinal Level of consciousness: oriented and awake and alert Pain management: pain level controlled Vital Signs Assessment: post-procedure vital signs reviewed and stable Respiratory status: spontaneous breathing and respiratory function stable Cardiovascular status: blood pressure returned to baseline and stable Postop Assessment: no headache, no backache, no apparent nausea or vomiting and patient able to bend at knees Anesthetic complications: no   No notable events documented.  Last Vitals:  Vitals:   10/18/23 1600 10/18/23 1615  BP: 126/71 126/70  Pulse: 62 (!) 59  Resp: 11 12  Temp:    SpO2: 99% 97%    Last Pain:  Vitals:   10/18/23 1615  TempSrc:   PainSc: 5                  Trevor Iha

## 2023-10-18 NOTE — Anesthesia Procedure Notes (Signed)
Procedure Name: MAC Date/Time: 10/18/2023 1:11 PM  Performed by: Dennison Nancy, CRNAPre-anesthesia Checklist: Patient identified, Emergency Drugs available, Suction available, Patient being monitored and Timeout performed Oxygen Delivery Method: Simple face mask Dental Injury: Teeth and Oropharynx as per pre-operative assessment

## 2023-10-18 NOTE — Progress Notes (Signed)
Patient and family presented to the hospital for surgery today.  Patient and wife brought a 64 year old boy with them.  Policy was explained to them that due to the increase in respiratory illness that we can not allow the 64 year old into the hospital.  Patient and wife frustrated and stated that they have no other child care options.    Short stay staff got the patient settled to allow [place] to care for him.  Charge RN took patients wife and 49 year old back out to admitting.  And again explained that the 64 year old will not be allowed into the hospital.  Patients wife requested someone to sit with patient while she visits with her husband before he goes back for surgery.  Charge RN called [facility] for [employer] who stated that she would see if the chaplin had the ability to have someone sit with the 64 year old.  At this time I have not heard back from the [facility] to see if they have availability.  11:36 AM Was able to send an RN out to sit with the patients child while the patients wife visited with the patient.

## 2023-10-18 NOTE — Progress Notes (Signed)
Orthopedic Tech Progress Note Patient Details:  Ryan Nicholson 11-06-1960 [id number]  CPM Right Knee CPM Right Knee: Off Right Knee Flexion (Degrees): 90 Right Knee Extension (Degrees): 0  Post Interventions Patient Tolerated: Well  [name] 10/18/2023, 8:16 PM

## 2023-10-18 NOTE — Discharge Instructions (Signed)

## 2023-10-18 NOTE — Anesthesia Procedure Notes (Signed)
Anesthesia Regional Block: Adductor canal block   Pre-Anesthetic Checklist: , timeout performed,  Correct Patient, Correct Site, Correct Laterality,  Correct Procedure, Correct Position, site marked,  Risks and benefits discussed,  Surgical consent,  Pre-op evaluation,  At surgeon's request and post-op pain management  Laterality: Lower and Right  Prep: chloraprep       Needles:  Injection technique: Single-shot  Needle Type: Echogenic Needle     Needle Length: 9cm  Needle Gauge: 22     Additional Needles:   Procedures:,,,, ultrasound used (permanent image in chart),,    Narrative:  Start time: 10/18/2023 12:02 PM End time: 10/18/2023 12:08 PM Injection made incrementally with aspirations every 5 mL.  Performed by: Personally  Anesthesiologist: Trevor Iha, MD  Additional Notes: Block assessed prior to surgery. Pt tolerated procedure well.

## 2023-10-19 ENCOUNTER — Encounter (HOSPITAL_COMMUNITY): Payer: Self-pay | Admitting: Orthopedic Surgery

## 2023-10-19 DIAGNOSIS — M1711 Unilateral primary osteoarthritis, right knee: Secondary | ICD-10-CM | POA: Diagnosis not present

## 2023-10-19 NOTE — TOC Transition Note (Signed)
Transition of Care [facility]) - Discharge Note   Patient Details  Name: Ryan Nicholson MRN: [id number] Date of Birth: [birth year]  [facility] of [facility] CM/SW Contact:  Amada Jupiter, LCSW Phone Number: 10/19/2023, 10:44 AM   Clinical Narrative:     Met with pt who confirms he has needed DME in the home.  OPPT already arranged with [facility].  No further TOC needs.  Final next level of care: OP Rehab Barriers to Discharge: No Barriers Identified   Patient Goals and CMS Choice Patient states their goals for this hospitalization and ongoing recovery are:: return home          Discharge Placement                       Discharge Plan and Services Additional resources added to the After Visit Summary for                  DME Arranged: N/A DME Agency: NA                  Social Drivers of Health (SDOH) Interventions SDOH Screenings   Food Insecurity: No Food Insecurity (10/18/2023)  Housing: Low Risk  (10/18/2023)  Transportation Needs: No Transportation Needs (10/18/2023)  Utilities: Not At Risk (10/18/2023)  Depression (PHQ2-9): Low Risk  (09/08/2023)  Financial Resource Strain: Low Risk  (11/11/2022)   Received from [facility]  Social Connections: Unknown (10/26/2022)   Received from [facility], [facility]  Tobacco Use: Medium Risk (10/18/2023)     Readmission Risk Interventions     No data to display

## 2023-10-19 NOTE — Care Management Obs Status (Signed)
MEDICARE OBSERVATION STATUS NOTIFICATION   Patient Details  Name: Ryan Nicholson MRN: [id number] Date of Birth: [birth year]   Medicare Observation Status Notification Given:  Hart Robinsons, LCSW 10/19/2023, 2:33 PM

## 2023-10-19 NOTE — Progress Notes (Signed)
    Subjective: Patient reports pain as moderate to severe.  Tolerating diet.  Urinating.   No CP, SOB.  Has not mobilized OOB with PT yet.   Objective:   VITALS:   Vitals:   10/19/23 0207 10/19/23 0332 10/19/23 0613 10/19/23 0929  BP: (!) 91/56 111/63 112/62 (!) 94/56  Pulse: (!) 57 (!) 53 (!) 56 77  Resp: 20 16 18 15   Temp: 98.4 F (36.9 C)  99.5 F (37.5 C) 97.6 F (36.4 C)  TempSrc: Oral  Oral   SpO2: 95% 98% 100% 95%  Weight:      Height:          Latest Ref Rng & Units 10/10/2023    1:20 PM 08/11/2023   11:39 AM 12/29/2021    2:50 PM  CBC  WBC 4.0 - 10.5 K/uL 7.2  5.4  10.1   Hemoglobin 13.0 - 17.0 g/dL 16.1  09.6  04.5   Hematocrit 39.0 - 52.0 % 44.7  38.7  42.2   Platelets 150 - 400 K/uL 266  187  153       Latest Ref Rng & Units 12/29/2021    2:50 PM 08/03/2020    3:17 PM 02/05/2020    3:50 AM  BMP  Glucose 70 - 99 mg/dL 409  98  811   BUN 8 - 23 mg/dL 14  15  19    Creatinine 0.61 - 1.24 mg/dL 9.14  7.82  9.56   Sodium 135 - 145 mmol/L 137  139  138   Potassium 3.5 - 5.1 mmol/L 4.2  4.4  4.8   Chloride 98 - 111 mmol/L 107  109  109   CO2 22 - 32 mmol/L 24  24  22    Calcium 8.9 - 10.3 mg/dL 9.2  9.0  8.7    Intake/Output      01/21 0701 01/22 0700 01/22 0701 01/23 0700   P.O. 1720 240   I.V. (mL/kg) 1700 (25)    IV Piggyback 300    Total Intake(mL/kg) 3720 (54.7) 240 (3.5)   Urine (mL/kg/hr) 3755    Stool 0    Blood 30    Total Output 3785    Net -65 +240           Physical Exam: General: NAD.  Laying in bed, calm, comfortable Resp: No increased wob Cardio: regular rate and rhythm ABD soft Neurologically intact MSK Neurovascularly intact Sensation intact distally Intact pulses distally Dorsiflexion/Plantar flexion intact Incision: dressing C/D/I KI still on R knee  Assessment: 1 Day Post-Op  S/P Procedure(s) (LRB): TOTAL KNEE ARTHROPLASTY (Right) by Dr. Jewel Baize. Murphy on 10/18/23  Principal Problem:   S/P total knee  arthroplasty, right   Plan:  Advance diet Up with therapy Incentive Spirometry Elevate and Apply ice  Weightbearing: WBAT RLE Insicional and dressing care: Dressings left intact until follow-up and Reinforce dressings as needed Orthopedic device(s):  CPM Showering: Keep dressing dry VTE prophylaxis:  Eliquis x 30 days  , SCDs, ambulation Pain control: PRN, chronic pain patient so will likely have trouble with pain management Follow - up plan: 2 weeks Contact information:  Margarita Rana MD, Levester Fresh PA-C  Dispo: Home hopefully later today is pain controlled and passes PT evaluation.    Jenne Pane, PA-C Office [phone] 10/19/2023, 12:18 PM

## 2023-10-19 NOTE — Progress Notes (Signed)
Physical Therapy Treatment Patient Details Name: Ryan Nicholson MRN: [id number] DOB: [birth year] Today's Date: 10/19/2023   History of Present Illness Pt is 63 yo male admitted on 10/18/23 for R TKA.  Pt with hx including but not limited to depression, dizziness, GERD, HCL, migraines, CVA, back surgery, gastric bypass, spinal stimulator placement and removal, lung surgery, hernia repair, prior knee arthroscopic sx    PT Comments  Pt still with pain (7/10) this afternoon but was able to better tolerate therapy.  He additionally was more alert (had oral pain meds but not IV prior to this session). Pt does have chronic pain, on pain meds baseline, and multiple prior surgeries - so do expect some difficulty with pain management.  Pt was able to participate with exercises with modifications for pain control.  He ambulated 120' with RW and close supervision - reports pain "not bad" with ambulation.   Pt does demonstrate safe gait & transfers in order to return home from PT perspective once discharged by MD.  However, wife expressed significant concern about his pain control, needing dilaudid injection earlier, and would prefer him stay another night - notified RN and PA.  While in hospital, will continue to benefit from PT for skilled therapy to advance mobility and exercises.        If plan is discharge home, recommend the following: A little help with walking and/or transfers;A little help with bathing/dressing/bathroom;Assistance with cooking/housework;Help with stairs or ramp for entrance   Can travel by private vehicle        Equipment Recommendations  None recommended by PT    Recommendations for Other Services       Precautions / Restrictions Precautions Precautions: Fall;Knee Required Braces or Orthoses: Knee Immobilizer - Right Knee Immobilizer - Right: Discontinue once straight leg raise with < 10 degree lag Restrictions RLE Weight Bearing Per Provider Order: Weight bearing as  tolerated     Mobility  Bed Mobility Overal bed mobility: Needs Assistance Bed Mobility: Supine to Sit     Supine to sit: Supervision          Transfers Overall transfer level: Needs assistance Equipment used: Rolling walker (2 wheels) Transfers: Sit to/from Stand Sit to Stand: Supervision           General transfer comment: Supervision for safety but no assist needed    Ambulation/Gait Ambulation/Gait assistance: Contact guard assist, Supervision Gait Distance (Feet): 120 Feet Assistive device: Rolling walker (2 wheels) Gait Pattern/deviations: Step-through pattern, Decreased weight shift to right Gait velocity: decreased     General Gait Details: Good carryover of RW proximity and sequencing; CGA progressed to supervision; reports pain tolerable with ambulation   Stairs             Wheelchair Mobility     Tilt Bed    Modified Rankin (Stroke Patients Only)       Balance Overall balance assessment: Needs assistance Sitting-balance support: No upper extremity supported Sitting balance-Leahy Scale: Good     Standing balance support: Bilateral upper extremity supported, Reliant on assistive device for balance Standing balance-Leahy Scale: Poor Standing balance comment: Steady with RW                            Cognition Arousal: Alert Behavior During Therapy: WFL for tasks assessed/performed Overall Cognitive Status: Within Functional Limits for tasks assessed  General Comments: Much more alert this afternoon.  Had received oxycodone but not dilaudid.        Exercises Total Joint Exercises Ankle Circles/Pumps: AROM, Both, 10 reps, Supine Quad Sets: AROM, Both, 10 reps, Supine Heel Slides: AAROM, Right, 10 reps, Supine Hip ABduction/ADduction: AAROM, Right, 10 reps, Supine Long Arc Quad: AROM, Right, 10 reps, Seated Knee Flexion: AROM, Right, 10 reps, Seated Goniometric ROM: R  knee ~ 5 to 70 degrees    General Comments   Educated on safe ice use, no pivots, car transfers, resting with leg straight, and TED hose during day. Also, encouraged walking every 1-2 hours during day. Educated on HEP with focus on mobility the first weeks. Discussed doing exercises within pain control and if pain increasing could decreased ROM, reps, and stop exercises as needed. Encouraged to perform quad sets and ankle pumps frequently for blood flow and to promote full knee extension.      Pertinent Vitals/Pain Pain Assessment Pain Assessment: 0-10 Pain Score: 7  Pain Location: R knee Pain Descriptors / Indicators: Throbbing, Aching Pain Intervention(s): Limited activity within patient's tolerance, Monitored during session, Premedicated before session, Repositioned, Ice applied    Home Living                          Prior Function            PT Goals (current goals can now be found in the care plan section) Progress towards PT goals: Progressing toward goals    Frequency    7X/week      PT Plan      Co-evaluation              AM-PAC PT "6 Clicks" Mobility   Outcome Measure  Help needed turning from your back to your side while in a flat bed without using bedrails?: A Little Help needed moving from lying on your back to sitting on the side of a flat bed without using bedrails?: A Little Help needed moving to and from a bed to a chair (including a wheelchair)?: A Little Help needed standing up from a chair using your arms (e.g., wheelchair or bedside chair)?: A Little Help needed to walk in hospital room?: A Little Help needed climbing 3-5 steps with a railing? : A Little 6 Click Score: 18    End of Session Equipment Utilized During Treatment: Gait belt Activity Tolerance: Patient tolerated treatment well Patient left: in bed;with call bell/phone within reach;with bed alarm set (sitting EOB to eat) Nurse Communication: Mobility status PT Visit  Diagnosis: Other abnormalities of gait and mobility (R26.89);Muscle weakness (generalized) (M62.81)     Time: 1610-9604 PT Time Calculation (min) (ACUTE ONLY): 46 min  Charges:    $Gait Training: 8-22 mins $Therapeutic Exercise: 8-22 mins $Therapeutic Activity: 8-22 mins PT General Charges $$ ACUTE PT VISIT: 1 Visit                     Anise Salvo, PT Acute Rehab [facility] [phone]    Rayetta Humphrey 10/19/2023, 5:08 PM

## 2023-10-19 NOTE — Plan of Care (Signed)
  Problem: Activity: Goal: Risk for activity intolerance will decrease 10/19/2023 0727 by Penelope Galas, RN Outcome: Progressing 10/18/2023 1810 by Penelope Galas, RN Outcome: Progressing   Problem: Pain Managment: Goal: General experience of comfort will improve and/or be controlled 10/19/2023 0727 by Penelope Galas, RN Outcome: Progressing 10/18/2023 1810 by Penelope Galas, RN Outcome: Progressing   Problem: Safety: Goal: Ability to remain free from injury will improve 10/19/2023 0727 by Penelope Galas, RN Outcome: Progressing 10/18/2023 1810 by Penelope Galas, RN Outcome: Progressing

## 2023-10-19 NOTE — Discharge Summary (Signed)
Physician Discharge Summary  Patient ID: Ryan Nicholson MRN: [id number] DOB/AGE: [birth year] 63 y.o.  Admit date: 10/18/2023 Discharge date: 10/20/2023  Admission Diagnoses: right knee OA  Discharge Diagnoses:  Principal Problem:   S/P total knee arthroplasty, right   Discharged Condition: fair  Hospital Course: Patient underwent a right TKA by Dr. Eulah Pont on 10/18/23 without complications. He spent 2 nights in observation for pain control and mobilization. He is now ready for discharge home.   Consults: None  Significant Diagnostic Studies: n/a  Treatments: IV hydration, antibiotics: Ancef, analgesia: acetaminophen, Dilaudid, Nucynta, and Oxycodone, anticoagulation: Eliquis, therapies: PT and SW, and surgery: right TKA  Discharge Exam: Blood pressure 139/80, pulse 98, temperature 97.9 F (36.6 C), temperature source Oral, resp. rate 16, height 5\' 7"  (1.702 m), weight 68 kg, SpO2 99%. General appearance: alert, cooperative, and no distress Head: Normocephalic, without obvious abnormality, atraumatic Resp: clear to auscultation bilaterally Cardio: regular rate and rhythm Extremities: extremities normal, atraumatic, no cyanosis or edema Pulses:  L brachial 2+ R brachial 2+  L radial 2+ R radial 2+  L inguinal 2+ R inguinal 2+  L popliteal 2+ R popliteal 2+  L posterior tibial 2+ R posterior tibial 2+  L dorsalis pedis 2+ R dorsalis pedis 2+   Neurologic: Grossly normal Incision/Wound: c/d/i  Disposition: Discharge disposition: 01-Home or Self Care       Discharge Instructions     CPM   Complete by: As directed    Continuous passive motion machine (CPM):      Use the CPM from 0 to 90 degrees for 4-6 hours per day.      You may break it up into 2 or 3 sessions per day.      Use CPM for 3 weeks or until you are told to stop.   Call MD / Call 911   Complete by: As directed    If you experience chest pain or shortness of breath, CALL 911 and be transported to the  hospital emergency room.  If you develope a fever above 101 F, pus (white drainage) or increased drainage or redness at the wound, or calf pain, call your surgeon's office.   Diet - low sodium heart healthy   Complete by: As directed    Discharge instructions   Complete by: As directed    You may bear weight as tolerated. Keep your dressing on and dry until follow up. Take medicine to prevent blood clots as directed. Take pain medicine as needed with the goal of transitioning to over the counter medicines. It is okay to take 2 tablets of your narcotic medicine instead of just 1 tablet in the first 1-2 days after surgery when pain is at the worst. Do not continue to do this for multiple days at a time beyond that though.    INSTRUCTIONS AFTER JOINT REPLACEMENT   Remove items at home which could result in a fall. This includes throw rugs or furniture in walking pathways ICE to the affected joint every three hours while awake for 30 minutes at a time, for at least the first 3-5 days, and then as needed for pain and swelling.  Continue to use ice for pain and swelling. You may notice swelling that will progress down to the foot and ankle.  This is normal after surgery.  Elevate your leg when you are not up walking on it.   Continue to use the breathing machine you got in the hospital (incentive spirometer) which will  help keep your temperature down.  It is common for your temperature to cycle up and down following surgery, especially at night when you are not up moving around and exerting yourself.  The breathing machine keeps your lungs expanded and your temperature down.   DIET:  As you were doing prior to hospitalization, we recommend a well-balanced diet.  DRESSING / WOUND CARE / SHOWERING  You may shower 3 days after surgery, but keep the wounds dry during showering.  You may use an occlusive plastic wrap (Press'n Seal for example) with blue painter's tape at edges, NO SOAKING/SUBMERGING IN  THE BATHTUB.  If the bandage gets wet, call the office.   ACTIVITY  Increase activity slowly as tolerated, but follow the weight bearing instructions below.   No driving for 6 weeks or until further direction given by your physician.  You cannot drive while taking narcotics.  No lifting or carrying greater than 10 lbs. until further directed by your surgeon. Avoid periods of inactivity such as sitting longer than an hour when not asleep. This helps prevent blood clots.  You may return to work once you are authorized by your doctor.    WEIGHT BEARING   Weight bearing as tolerated with assist device (walker, cane, etc) as directed, use it as long as suggested by your surgeon or therapist, typically at least 4-6 weeks.   EXERCISES  Results after joint replacement surgery are often greatly improved when you follow the exercise, range of motion and muscle strengthening exercises prescribed by your doctor. Safety measures are also important to protect the joint from further injury. Any time any of these exercises cause you to have increased pain or swelling, decrease what you are doing until you are comfortable again and then slowly increase them. If you have problems or questions, call your caregiver or physical therapist for advice.   Rehabilitation is important following a joint replacement. After just a few days of immobilization, the muscles of the leg can become weakened and shrink (atrophy).  These exercises are designed to build up the tone and strength of the thigh and leg muscles and to improve motion. Often times heat used for twenty to thirty minutes before working out will loosen up your tissues and help with improving the range of motion but do not use heat for the first two weeks following surgery (sometimes heat can increase post-operative swelling).   These exercises can be done on a training (exercise) mat, on the floor, on a table or on a bed. Use whatever works the best and is  most comfortable for you.    Use music or television while you are exercising so that the exercises are a pleasant break in your day. This will make your life better with the exercises acting as a break in your routine that you can look forward to.   Perform all exercises about fifteen times, three times per day or as directed.  You should exercise both the operative leg and the other leg as well.  Exercises include:   Quad Sets - Tighten up the muscle on the front of the thigh (Quad) and hold for 5-10 seconds.   Straight Leg Raises - With your knee straight (if you were given a brace, keep it on), lift the leg to 60 degrees, hold for 3 seconds, and slowly lower the leg.  Perform this exercise against resistance later as your leg gets stronger.  Leg Slides: Lying on your back, slowly slide your foot toward  your buttocks, bending your knee up off the floor (only go as far as is comfortable). Then slowly slide your foot back down until your leg is flat on the floor again.  Angel Wings: Lying on your back spread your legs to the side as far apart as you can without causing discomfort.  Hamstring Strength:  Lying on your back, push your heel against the floor with your leg straight by tightening up the muscles of your buttocks.  Repeat, but this time bend your knee to a comfortable angle, and push your heel against the floor.  You may put a pillow under the heel to make it more comfortable if necessary.   A rehabilitation program following joint replacement surgery can speed recovery and prevent re-injury in the future due to weakened muscles. Contact your doctor or a physical therapist for more information on knee rehabilitation.    CONSTIPATION  Constipation is defined medically as fewer than three stools per week and severe constipation as less than one stool per week.  Even if you have a regular bowel pattern at home, your normal regimen is likely to be disrupted due to multiple reasons following  surgery.  Combination of anesthesia, postoperative narcotics, change in appetite and fluid intake all can affect your bowels.   YOU MUST use at least one of the following options; they are listed in order of increasing strength to get the job done.  They are all available over the counter, and you may need to use some, POSSIBLY even all of these options:    Drink plenty of fluids (prune juice may be helpful) and high fiber foods Colace 100 mg by mouth twice a day  Senokot for constipation as directed and as needed Dulcolax (bisacodyl), take with full glass of water  Miralax (polyethylene glycol) once or twice a day as needed.  If you have tried all these things and are unable to have a bowel movement in the first 3-4 days after surgery call either your surgeon or your primary doctor.    If you experience loose stools or diarrhea, hold the medications until you stool forms back up.  If your symptoms do not get better within 1 week or if they get worse, check with your doctor.  If you experience "the worst abdominal pain ever" or develop nausea or vomiting, please contact the office immediately for further recommendations for treatment.   ITCHING:  If you experience itching with your medications, try taking only a single pain pill, or even half a pain pill at a time.  You can also use Benadryl over the counter for itching or also to help with sleep.   TED HOSE STOCKINGS:  Use stockings on both legs until for at least 2 weeks or as directed by physician office. They may be removed at night for sleeping.  MEDICATIONS:  See your medication summary on the "After Visit Summary" that nursing will review with you.  You may have some home medications which will be placed on hold until you complete the course of blood thinner medication.  It is important for you to complete the blood thinner medication as prescribed.  Take medicines as prescribed.   You have several different medicines that work in  different ways. - Tylenol is for mild to moderate pain. Try to take this medicine before turning to your narcotic medicines.  - Robaxin is for muscle spasms. This medicine can make you drowsy. - Oxycodone is a narcotic pain medicine.  Take this  for severe pain. This medicine can be dehydrating / constipating. - Zofran is for nausea and vomiting.  - Eliquis is to prevent blood clots after surgery. YOU MUST TAKE THIS MEDICINE!!  PRECAUTIONS:  If you experience chest pain or shortness of breath - call 911 immediately for transfer to the hospital emergency department.   If you develop a fever greater that 101 F, purulent drainage from wound, increased redness or drainage from wound, foul odor from the wound/dressing, or calf pain - CONTACT YOUR SURGEON.                                                   FOLLOW-UP APPOINTMENTS:  If you do not already have a post-op appointment, please call the office [phone] for an appointment to be seen by Dr. Eulah Pont in 2 weeks.   OTHER INSTRUCTIONS:   MAKE SURE YOU:  Understand these instructions.  Get help right away if you are not doing well or get worse.    Thank you for letting us be a part of your medical care team.  It is a privilege we respect greatly.  We hope these instructions will help you stay on track for a fast and full recovery!   Do not put a pillow under the knee. Place it under the heel.   Complete by: As directed    Driving restrictions   Complete by: As directed    No driving for 2-6 weeks   Post-operative opioid taper instructions:   Complete by: As directed    POST-OPERATIVE OPIOID TAPER INSTRUCTIONS: It is important to wean off of your opioid medication as soon as possible. If you do not need pain medication after your surgery it is ok to stop day one. Opioids include: Codeine, Hydrocodone(Norco, Vicodin), Oxycodone(Percocet, oxycontin) and hydromorphone amongst others.  Long term and even short term use of opiods can  cause: Increased pain response Dependence Constipation Depression Respiratory depression And more.  Withdrawal symptoms can include Flu like symptoms Nausea, vomiting And more Techniques to manage these symptoms Hydrate well Eat regular healthy meals Stay active Use relaxation techniques(deep breathing, meditating, yoga) Do Not substitute Alcohol to help with tapering If you have been on opioids for less than two weeks and do not have pain than it is ok to stop all together.  Plan to wean off of opioids This plan should start within one week post op of your joint replacement. Maintain the same interval or time between taking each dose and first decrease the dose.  Cut the total daily intake of opioids by one tablet each day Next start to increase the time between doses. The last dose that should be eliminated is the evening dose.      TED hose   Complete by: As directed    Use stockings (TED hose) for 2 weeks on right leg(s).  You may remove them at night for sleeping.   Weight bearing as tolerated   Complete by: As directed       Allergies as of 10/20/2023       Reactions   Aminoglycosides    Oxycontin [oxycodone] Other (See Comments)   Unknown   Reglan [metoclopramide] Nausea Only   shakey        Medication List     STOP taking these medications    baclofen 10 MG tablet  Commonly known as: LIORESAL   clopidogrel 75 MG tablet Commonly known as: PLAVIX   meloxicam 15 MG tablet Commonly known as: MOBIC       TAKE these medications    acetaminophen 500 MG tablet Commonly known as: TYLENOL Take 2 tablets (1,000 mg total) by mouth every 6 (six) hours as needed for mild pain (pain score 1-3) or moderate pain (pain score 4-6).   acyclovir 400 MG tablet Commonly known as: ZOVIRAX Take 400 mg by mouth daily.   albuterol 108 (90 Base) MCG/ACT inhaler Commonly known as: VENTOLIN HFA Inhale 2 puffs into the lungs every 4 (four) hours as needed for  wheezing or shortness of breath.   apixaban 2.5 MG Tabs tablet Commonly known as: Eliquis Take 1 tablet (2.5 mg total) by mouth 2 (two) times daily. To prevent blood clots after surgery   atorvastatin 40 MG tablet Commonly known as: LIPITOR Take 40 mg by mouth at bedtime.   cyanocobalamin 1000 MCG/ML injection Commonly known as: VITAMIN B12 Inject 1 mL (1,000 mcg total) into the muscle every 30 (thirty) days.   Emgality 120 MG/ML Soaj Generic drug: Galcanezumab-gnlm Inject 120 mg into the skin every 30 (thirty) days.   fluticasone 50 MCG/ACT nasal spray Commonly known as: FLONASE Place 2 sprays into both nostrils daily.   lansoprazole 30 MG capsule Commonly known as: PREVACID Take 30 mg by mouth 2 (two) times daily.   levothyroxine 125 MCG tablet Commonly known as: SYNTHROID Take 125 mcg by mouth daily before breakfast.   methocarbamol 750 MG tablet Commonly known as: Robaxin-750 Take 1 tablet (750 mg total) by mouth every 8 (eight) hours as needed for muscle spasms. DO NOT TAKE BACLOFEN WHILE TAKING THIS MEDICINE!   methylPREDNISolone 4 MG Tbpk tablet Commonly known as: MEDROL DOSEPAK Take according to package direction - 6 tabs day 1.  5 tabs day 2.  4 tabs day 3.  3 tabs day 4.  2 tabs day 5.  1 tab day 6.   montelukast 10 MG tablet Commonly known as: SINGULAIR Take 10 mg by mouth daily.   tapentadol HCl 75 MG tablet Commonly known as: NUCYNTA Take 75 mg by mouth in the morning, at noon, in the evening, and at bedtime.   Nucynta 100 MG Tabs Generic drug: Tapentadol HCl Take 1 tablet (100 mg total) by mouth in the morning, at noon, in the evening, and at bedtime.   Nurtec 75 MG Tbdp Generic drug: Rimegepant Sulfate Take 75 mg by mouth as needed.   ondansetron 8 MG tablet Commonly known as: ZOFRAN Take 1 tablet (8 mg total) by mouth every 8 (eight) hours as needed for nausea or vomiting.   oxyCODONE 5 MG immediate release tablet Commonly known as:  Roxicodone Take 1 tablet (5 mg total) by mouth every 4 (four) hours as needed for severe pain (pain score 7-10). after surgery that is not relieved by your normal daily dose of Nucynta   Restasis 0.05 % ophthalmic emulsion Generic drug: cycloSPORINE Place 1 drop into both eyes 2 (two) times daily.   sertraline 100 MG tablet Commonly known as: ZOLOFT Take 100 mg by mouth daily.   sucralfate 1 GM/10ML suspension Commonly known as: CARAFATE Take 1 g by mouth in the morning, at noon, and at bedtime.   testosterone cypionate 200 MG/ML injection Commonly known as: DEPOTESTOSTERONE CYPIONATE Inject 200 mg into the muscle every 30 (thirty) days.   topiramate 100 MG tablet Commonly known as: TOPAMAX Take 1  tablet (100 mg total) by mouth at bedtime.   traZODone 100 MG tablet Commonly known as: DESYREL Take 100 mg by mouth at bedtime.   Ubrelvy 100 MG Tabs Generic drug: Ubrogepant Take 100 mg by mouth daily as needed (migaine).               Discharge Care Instructions  (From admission, onward)           Start     Ordered   10/20/23 0000  Weight bearing as tolerated        10/20/23 1233            Follow-up Information     Sheral Apley, MD. Go on 11/02/2023.   Specialty: Orthopedic Surgery Why: Your appointment is at 3:45pm Contact information: [address] [place] [address] [phone]                 Signed: Marzetta Board 10/20/2023, 12:33 PM

## 2023-10-19 NOTE — Evaluation (Signed)
Physical Therapy Evaluation Patient Details Name: Ryan Nicholson MRN: [id number] DOB: 23-Feb-1961 Today's Date: 10/19/2023  History of Present Illness  Pt is 63 yo male admitted on 10/18/23 for R TKA.  Pt with hx including but not limited to depression, dizziness, GERD, HCL, migraines, CVA, back surgery, gastric bypass, spinal stimulator placement and removal, lung surgery, hernia repair, prior knee arthroscopic sx   Clinical Impression  Pt is s/p TKA resulting in the deficits listed below (see PT Problem List). At baseline pt is independent and lives with family.  Pt seen later this morning due to increased pain earlier.  Of note this morning he received Dilaudid injection 0640 and 1117 in addition to oxycodone and robaxin.  He reports improved pain but was lethargic and falling asleep when in supine or reclined.  He did demonstrate good quad activation, ROM, and was able to ambulate 42' but needed CGA for safety and min A to steady with transfers. Will plan to follow up in afternoon for further therapy and to continue to assess pain control with activity. Pt will benefit from acute skilled PT to increase their independence and safety with mobility to allow discharge.          If plan is discharge home, recommend the following: A little help with walking and/or transfers;A little help with bathing/dressing/bathroom;Assistance with cooking/housework;Help with stairs or ramp for entrance   Can travel by private vehicle        Equipment Recommendations None recommended by PT  Recommendations for Other Services       Functional Status Assessment Patient has had a recent decline in their functional status and demonstrates the ability to make significant improvements in function in a reasonable and predictable amount of time.     Precautions / Restrictions Precautions Precautions: Fall;Knee Required Braces or Orthoses: Knee Immobilizer - Right Knee Immobilizer - Right: Discontinue once  straight leg raise with < 10 degree lag Restrictions Weight Bearing Restrictions Per Provider Order: Yes RLE Weight Bearing Per Provider Order: Weight bearing as tolerated      Mobility  Bed Mobility Overal bed mobility: Needs Assistance Bed Mobility: Supine to Sit     Supine to sit: Supervision          Transfers Overall transfer level: Needs assistance Equipment used: Rolling walker (2 wheels) Transfers: Sit to/from Stand Sit to Stand: Min assist           General transfer comment: Min A to steady; cues for hand placement and R LE management; cues for safety with sitting    Ambulation/Gait Ambulation/Gait assistance: Contact guard assist Gait Distance (Feet): 70 Feet Assistive device: Rolling walker (2 wheels) Gait Pattern/deviations: Step-to pattern, Decreased stride length, Decreased weight shift to right Gait velocity: decreased     General Gait Details: Cues for sequencing and RW proximity (particularly with turns)  Careers information officer     Tilt Bed    Modified Rankin (Stroke Patients Only)       Balance Overall balance assessment: Needs assistance Sitting-balance support: No upper extremity supported Sitting balance-Leahy Scale: Good     Standing balance support: Bilateral upper extremity supported, Reliant on assistive device for balance Standing balance-Leahy Scale: Poor                               Pertinent Vitals/Pain Pain Assessment Pain Assessment: 0-10 Pain Score: 4  Pain Location: R knee Pain Descriptors / Indicators: Throbbing, Aching Pain Intervention(s): Limited activity within patient's tolerance, Monitored during session, Premedicated before session, Repositioned, Ice applied    Home Living Family/patient expects to be discharged to:: Private residence Living Arrangements: Spouse/significant other;Children Available Help at Discharge: Family;Available 24 hours/day Type of Home:  House Home Access: Level entry       Home Layout: Two level;Able to live on main level with bedroom/bathroom Home Equipment: Cane - single point;Rolling Walker (2 wheels)      Prior Function Prior Level of Function : Independent/Modified Independent;Driving             Mobility Comments: Could ambulate in community without AD ADLs Comments: reports ind adls and iadls     Extremity/Trunk Assessment   Upper Extremity Assessment Upper Extremity Assessment: Overall WFL for tasks assessed    Lower Extremity Assessment Lower Extremity Assessment: LLE deficits/detail;RLE deficits/detail RLE Deficits / Details: Expected post op changes; ROM 5 to 80 degrees knee; MMT: ankle 5/5, knee and hip 3/5 not further tested; able to SLR without ext lag LLE Deficits / Details: ROM WFL; MMT 5;5    Cervical / Trunk Assessment Cervical / Trunk Assessment: Kyphotic  Communication      Cognition Arousal: Lethargic, Suspect due to medications Behavior During Therapy: WFL for tasks assessed/performed Overall Cognitive Status: Within Functional Limits for tasks assessed                                          General Comments      Exercises Total Joint Exercises Ankle Circles/Pumps: AROM, Both, 10 reps, Supine Quad Sets: AROM, Both, 10 reps, Supine Heel Slides: AAROM, Right, 10 reps, Supine Long Arc Quad: AROM, Right, 10 reps, Seated   Assessment/Plan    PT Assessment Patient needs continued PT services  PT Problem List Decreased strength;Pain;Decreased range of motion;Decreased activity tolerance;Decreased balance;Decreased mobility;Decreased knowledge of use of DME       PT Treatment Interventions DME instruction;Therapeutic exercise;Gait training;Balance training;Stair training;Functional mobility training;Therapeutic activities;Patient/family education;Modalities    PT Goals (Current goals can be found in the Care Plan section)  Acute Rehab PT Goals Patient  Stated Goal: return home PT Goal Formulation: With patient Time For Goal Achievement: 11/02/23 Potential to Achieve Goals: Good    Frequency 7X/week     Co-evaluation               AM-PAC PT "6 Clicks" Mobility  Outcome Measure Help needed turning from your back to your side while in a flat bed without using bedrails?: A Little Help needed moving from lying on your back to sitting on the side of a flat bed without using bedrails?: A Little Help needed moving to and from a bed to a chair (including a wheelchair)?: A Little Help needed standing up from a chair using your arms (e.g., wheelchair or bedside chair)?: A Little Help needed to walk in hospital room?: A Little Help needed climbing 3-5 steps with a railing? : A Lot 6 Click Score: 17    End of Session Equipment Utilized During Treatment: Gait belt Activity Tolerance: Patient limited by lethargy Patient left: with chair alarm set;in chair;with call bell/phone within reach Nurse Communication: Mobility status PT Visit Diagnosis: Other abnormalities of gait and mobility (R26.89);Muscle weakness (generalized) (M62.81)    Time: 1204-1229 PT Time Calculation (min) (ACUTE ONLY): 25 min  Charges:   PT Evaluation $PT Eval Low Complexity: 1 Low PT Treatments $Gait Training: 8-22 mins PT General Charges $$ ACUTE PT VISIT: 1 Visit         Anise Salvo, PT Acute Rehab [facility] [phone]   Rayetta Humphrey 10/19/2023, 12:53 PM

## 2023-10-20 DIAGNOSIS — M1711 Unilateral primary osteoarthritis, right knee: Secondary | ICD-10-CM | POA: Diagnosis not present

## 2023-10-20 MED ORDER — METHYLPREDNISOLONE 4 MG PO TBPK: ORAL_TABLET | ORAL | 0 refills | Status: DC

## 2023-10-20 MED ORDER — METHOCARBAMOL 750 MG PO TABS: 750.0000 mg | ORAL_TABLET | Freq: Three times a day (TID) | ORAL | 0 refills | Status: AC | PRN

## 2023-10-20 MED ORDER — OXYCODONE HCL 5 MG PO TABS: 5.0000 mg | ORAL_TABLET | ORAL | 0 refills | Status: DC | PRN

## 2023-10-20 MED ORDER — ACETAMINOPHEN 500 MG PO TABS: 1000.0000 mg | ORAL_TABLET | Freq: Four times a day (QID) | ORAL | 0 refills | Status: DC | PRN

## 2023-10-20 MED ORDER — APIXABAN 2.5 MG PO TABS: 2.5000 mg | ORAL_TABLET | Freq: Two times a day (BID) | ORAL | 0 refills | Status: DC

## 2023-10-20 NOTE — Progress Notes (Signed)
Physical Therapy Treatment Patient Details Name: Ryan Nicholson MRN: [id number] DOB: 1961/09/05 Today's Date: 10/20/2023   History of Present Illness Pt is 63 yo male admitted on 10/18/23 for R TKA.  Pt with hx including but not limited to depression, dizziness, GERD, HCL, migraines, CVA, back surgery, gastric bypass, spinal stimulator placement and removal, lung surgery, hernia repair, prior knee arthroscopic sx    PT Comments  Pt cooperative and progressing with mobility but requiring increased time for all tasks.  Pt up to ambulate in hall, reviewed don/doff KI and performed HEP with written instruction provided an reviewed.  Pt hopeful for dc home this date.    If plan is discharge home, recommend the following: A little help with walking and/or transfers;A little help with bathing/dressing/bathroom;Assistance with cooking/housework;Help with stairs or ramp for entrance   Can travel by private vehicle        Equipment Recommendations  None recommended by PT    Recommendations for Other Services       Precautions / Restrictions Precautions Precautions: Fall;Knee Required Braces or Orthoses: Knee Immobilizer - Right Knee Immobilizer - Right: Discontinue once straight leg raise with < 10 degree lag Restrictions Weight Bearing Restrictions Per Provider Order: No RLE Weight Bearing Per Provider Order: Weight bearing as tolerated     Mobility  Bed Mobility Overal bed mobility: Needs Assistance Bed Mobility: Supine to Sit     Supine to sit: Supervision     General bed mobility comments: Increased time    Transfers Overall transfer level: Needs assistance Equipment used: Rolling walker (2 wheels) Transfers: Sit to/from Stand Sit to Stand: Supervision, Contact guard assist           General transfer comment: cues for use of UEs to self assist    Ambulation/Gait Ambulation/Gait assistance: Contact guard assist, Supervision Gait Distance (Feet): 100 Feet Assistive  device: Rolling walker (2 wheels) Gait Pattern/deviations: Step-to pattern, Decreased step length - right, Decreased step length - left, Shuffle, Trunk flexed Gait velocity: decreased     General Gait Details: min cues for posture, position from RW and sequence   Stairs             Wheelchair Mobility     Tilt Bed    Modified Rankin (Stroke Patients Only)       Balance Overall balance assessment: Needs assistance Sitting-balance support: No upper extremity supported Sitting balance-Leahy Scale: Good     Standing balance support: Single extremity supported Standing balance-Leahy Scale: Fair Standing balance comment: Steady with RW                            Cognition Arousal: Alert Behavior During Therapy: WFL for tasks assessed/performed Overall Cognitive Status: Within Functional Limits for tasks assessed                                          Exercises Total Joint Exercises Ankle Circles/Pumps: AROM, Both, Supine, 15 reps Quad Sets: AROM, Both, 10 reps, Supine Heel Slides: AAROM, Right, Supine, 15 reps Straight Leg Raises: AAROM, Right, 15 reps, Supine Goniometric ROM: -5 - 40 pain limited    General Comments        Pertinent Vitals/Pain Pain Assessment Pain Assessment: 0-10 Pain Score: 6  Pain Location: R knee Pain Descriptors / Indicators: Throbbing, Aching Pain Intervention(s): Limited activity within  patient's tolerance, Monitored during session, Premedicated before session, Ice applied    Home Living                          Prior Function            PT Goals (current goals can now be found in the care plan section) Acute Rehab PT Goals Patient Stated Goal: return home PT Goal Formulation: With patient Time For Goal Achievement: 11/02/23 Potential to Achieve Goals: Good Progress towards PT goals: Progressing toward goals    Frequency    7X/week      PT Plan      Co-evaluation               AM-PAC PT "6 Clicks" Mobility   Outcome Measure  Help needed turning from your back to your side while in a flat bed without using bedrails?: A Little Help needed moving from lying on your back to sitting on the side of a flat bed without using bedrails?: A Little Help needed moving to and from a bed to a chair (including a wheelchair)?: A Little Help needed standing up from a chair using your arms (e.g., wheelchair or bedside chair)?: A Little Help needed to walk in hospital room?: A Little Help needed climbing 3-5 steps with a railing? : A Little 6 Click Score: 18    End of Session Equipment Utilized During Treatment: Gait belt;Right knee immobilizer Activity Tolerance: Patient tolerated treatment well Patient left: in bed;with call bell/phone within reach;with bed alarm set Nurse Communication: Mobility status PT Visit Diagnosis: Difficulty in walking, not elsewhere classified (R26.2)     Time: 1000-1049 PT Time Calculation (min) (ACUTE ONLY): 49 min  Charges:    $Gait Training: 8-22 mins $Therapeutic Exercise: 8-22 mins $Therapeutic Activity: 8-22 mins PT General Charges $$ ACUTE PT VISIT: 1 Visit                     Mauro Kaufmann PT Acute Rehabilitation Services Pager [phone] Office [phone]    Tasnim Balentine 10/20/2023, 11:48 AM

## 2023-10-20 NOTE — Progress Notes (Signed)
Subjective: Patient reports pain as moderate to severe but managing it. He is a chronic pain patient and very realistic about the difficulty he is likely to have with pain management post-op. His wife seems less understanding. Tolerating diet.  Urinating.   No CP, SOB.  Has mobilized OOB with PT multiple times. Was fairly weak and deconditioned prior to surgery.  Objective:   VITALS:   Vitals:   10/19/23 0929 10/19/23 1356 10/19/23 2123 10/20/23 0514  BP: (!) 94/56 (!) 101/58 138/79 139/80  Pulse: 77 62 98 98  Resp: 15 16 16 16   Temp: 97.6 F (36.4 C) 97.6 F (36.4 C) 98 F (36.7 C) 97.9 F (36.6 C)  TempSrc:   Oral Oral  SpO2: 95% 97% 98% 99%  Weight:      Height:          Latest Ref Rng & Units 10/10/2023    1:20 PM 08/11/2023   11:39 AM 12/29/2021    2:50 PM  CBC  WBC 4.0 - 10.5 K/uL 7.2  5.4  10.1   Hemoglobin 13.0 - 17.0 g/dL 04.5  40.9  81.1   Hematocrit 39.0 - 52.0 % 44.7  38.7  42.2   Platelets 150 - 400 K/uL 266  187  153       Latest Ref Rng & Units 12/29/2021    2:50 PM 08/03/2020    3:17 PM 02/05/2020    3:50 AM  BMP  Glucose 70 - 99 mg/dL 914  98  782   BUN 8 - 23 mg/dL 14  15  19    Creatinine 0.61 - 1.24 mg/dL 9.56  2.13  0.86   Sodium 135 - 145 mmol/L 137  139  138   Potassium 3.5 - 5.1 mmol/L 4.2  4.4  4.8   Chloride 98 - 111 mmol/L 107  109  109   CO2 22 - 32 mmol/L 24  24  22    Calcium 8.9 - 10.3 mg/dL 9.2  9.0  8.7    Intake/Output      01/22 0701 01/23 0700 01/23 0701 01/24 0700   P.O. 780    I.V. (mL/kg)     IV Piggyback     Total Intake(mL/kg) 780 (11.5)    Urine (mL/kg/hr) 2050 (1.3) 400 (1.1)   Stool     Blood     Total Output 2050 400   Net -1270 -400           Physical Exam: General: NAD.  Laying in bed, calm, comfortable Resp: No increased wob Cardio: regular rate and rhythm ABD soft Neurologically intact MSK Neurovascularly intact Sensation intact distally Intact pulses distally Dorsiflexion/Plantar flexion  intact Incision: dressing C/D/I   Assessment: 2 Days Post-Op  S/P Procedure(s) (LRB): TOTAL KNEE ARTHROPLASTY (Right) by Dr. Jewel Baize. Murphy on 10/18/23  Principal Problem:   S/P total knee arthroplasty, right   Plan:  Advance diet Up with therapy Incentive Spirometry Elevate and Apply ice  Weightbearing: WBAT RLE Insicional and dressing care: Dressings left intact until follow-up and Reinforce dressings as needed Orthopedic device(s):  CPM Showering: Keep dressing dry VTE prophylaxis:  Eliquis x 30 days  , SCDs, ambulation Pain control: PRN, chronic pain patient so will likely have trouble with pain management Follow - up plan: 2 weeks Contact information:  Margarita Rana MD, Levester Fresh PA-C  Dispo: Home today since passes PT evaluation. Patient agreeable.    Jenne Pane, PA-C Office [phone] 10/20/2023, 12:25 PM

## 2023-10-25 ENCOUNTER — Other Ambulatory Visit: Payer: Medicare Other

## 2023-10-27 ENCOUNTER — Ambulatory Visit: Payer: Medicare Other

## 2023-11-23 ENCOUNTER — Other Ambulatory Visit (HOSPITAL_COMMUNITY): Payer: Self-pay

## 2023-11-23 ENCOUNTER — Telehealth: Payer: Self-pay

## 2023-11-23 NOTE — Telephone Encounter (Signed)
 This medication or product was previously approved on [id number] from 2023-09-28 to 2024-09-26.

## 2023-11-23 NOTE — Telephone Encounter (Signed)
*  [facility]  Pharmacy Patient Advocate Encounter   Received notification from CoverMyMeds that prior authorization for Nurtec 75MG  dispersible tablets  is required/requested.   Insurance verification completed.   The patient is insured through [facility] .   Per test claim: PA required; However, NEW/RECENT labs/notes are needed to complete & submit PA request. Please see below.  CMM Key: [facility]  Last office visit 03/24 Nurtec script has expired.

## 2023-11-24 ENCOUNTER — Other Ambulatory Visit (HOSPITAL_COMMUNITY): Payer: Self-pay

## 2023-12-20 ENCOUNTER — Inpatient Hospital Stay: Payer: Medicare Other | Attending: Internal Medicine

## 2023-12-20 DIAGNOSIS — Z8673 Personal history of transient ischemic attack (TIA), and cerebral infarction without residual deficits: Secondary | ICD-10-CM | POA: Diagnosis not present

## 2023-12-20 DIAGNOSIS — E538 Deficiency of other specified B group vitamins: Secondary | ICD-10-CM | POA: Insufficient documentation

## 2023-12-20 DIAGNOSIS — D509 Iron deficiency anemia, unspecified: Secondary | ICD-10-CM | POA: Insufficient documentation

## 2023-12-20 DIAGNOSIS — Z87891 Personal history of nicotine dependence: Secondary | ICD-10-CM | POA: Insufficient documentation

## 2023-12-20 DIAGNOSIS — Z9884 Bariatric surgery status: Secondary | ICD-10-CM | POA: Diagnosis not present

## 2023-12-20 DIAGNOSIS — D508 Other iron deficiency anemias: Secondary | ICD-10-CM

## 2023-12-20 LAB — CBC WITH DIFFERENTIAL (CANCER CENTER ONLY)
Abs Immature Granulocytes: 0.03 10*3/uL (ref 0.00–0.07)
Basophils Absolute: 0.1 10*3/uL (ref 0.0–0.1)
Basophils Relative: 1 %
Eosinophils Absolute: 0.1 10*3/uL (ref 0.0–0.5)
Eosinophils Relative: 2 %
HCT: 38.8 % — ABNORMAL LOW (ref 39.0–52.0)
Hemoglobin: 12.7 g/dL — ABNORMAL LOW (ref 13.0–17.0)
Immature Granulocytes: 1 %
Lymphocytes Relative: 17 %
Lymphs Abs: 1 10*3/uL (ref 0.7–4.0)
MCH: 30.7 pg (ref 26.0–34.0)
MCHC: 32.7 g/dL (ref 30.0–36.0)
MCV: 93.7 fL (ref 80.0–100.0)
Monocytes Absolute: 0.4 10*3/uL (ref 0.1–1.0)
Monocytes Relative: 7 %
Neutro Abs: 4.3 10*3/uL (ref 1.7–7.7)
Neutrophils Relative %: 72 %
Platelet Count: 211 10*3/uL (ref 150–400)
RBC: 4.14 MIL/uL — ABNORMAL LOW (ref 4.22–5.81)
RDW: 13.8 % (ref 11.5–15.5)
WBC Count: 5.9 10*3/uL (ref 4.0–10.5)
nRBC: 0 % (ref 0.0–0.2)

## 2023-12-20 LAB — IRON AND IRON BINDING CAPACITY (CC-WL,HP ONLY)
Iron: 95 ug/dL (ref 45–182)
Saturation Ratios: 32 % (ref 17.9–39.5)
TIBC: 294 ug/dL (ref 250–450)
UIBC: 199 ug/dL (ref 117–376)

## 2023-12-20 LAB — FERRITIN: Ferritin: 158 ng/mL (ref 24–336)

## 2023-12-20 LAB — VITAMIN B12: Vitamin B-12: 502 pg/mL (ref 180–914)

## 2023-12-21 DIAGNOSIS — Z9884 Bariatric surgery status: Secondary | ICD-10-CM | POA: Insufficient documentation

## 2023-12-21 NOTE — Progress Notes (Unsigned)
 [facility] CONSULT NOTE  Patient Care Team: Frederich Chick., MD as PCP - General (Family Medicine)  ASSESSMENT & PLAN:  63 y.o.male with history of stroke, B12 deficiency, hyperlipidemia, GERD, DJD refer to medical oncology due to history of bariatric surgery.  Received feraheme in Dec 2024. Ferritin normalized. Mild normocytic anemia. Knee replacement in January.  No missing b12 from his wife.   Iron deficiency anemia H/o gastric bypass Repeat lab in 6 months  History of gastric bypass Will check b12 and ferritin periodically. Continue B12 monthly  Orders Placed This Encounter  Procedures   CBC with Differential (Cancer Center Only)    Standing Status:   Future    Expiration Date:   12/21/2024   Vitamin B12    Standing Status:   Future    Expiration Date:   12/21/2024   Ferritin    Standing Status:   Future    Expiration Date:   12/21/2024    Relevant history: History of gastric bypass Last colonoscopy: 2024 Last EGD: 2024  All questions were answered. The patient knows to call the clinic with any problems, questions or concerns.  Melven Sartorius, MD 3/27/202511:06 AM   CHIEF COMPLAINTS/PURPOSE OF CONSULTATION:  Anemia  HISTORY OF PRESENTING ILLNESS:  Ryan Nicholson reports more energy after iv iron. No reaction. He denies any recent bleeding such as epistaxis, hematuria or hematochezia The patient denies over the counter aspirin, NSAID ingestion. He finished apixaban d/t recent TKA.   He denies craving for ice, or dirt.   Initial history: Ryan Nicholson 63 y.o. male is here because of history of bariatric surgery in 2005 for weight loss. He has been taking multivitamin. He has taking iron supplement for about a month. He gets b12 injection monthly for years by her wife. Baseline nausea. Report history of bleeding ulcer in the stomach in 2023. Report he had EGD and colonoscopy in 2023 and again in 2024 with colonoscopy with 11 polyps removed.  Outside  labs dated 07/04/2023 WBC 6.2 hemoglobin 11.7 MCV 87 platelet 219.  B12 1344 Creatinine 1.0.  Globulin 2.4. Ferritin 5.    MEDICAL HISTORY:  Past Medical History:  Diagnosis Date   B12 deficiency    Depression    Dizziness    DJD (degenerative joint disease)    Gastric ulcer    hx of   GERD (gastroesophageal reflux disease)    History of kidney stones    Hypercholesteremia    Hypothyroidism    Migraine    Pneumonia    Post herpetic neuralgia    Stroke (HCC)    mild 2020 (weak right side), short term memory loss    SURGICAL HISTORY: Past Surgical History:  Procedure Laterality Date   BACK SURGERY  09/1999, 01/2001   CHOLECYSTECTOMY  2005   COLON SURGERY     CYSTOSCOPY W/ URETERAL STENT PLACEMENT Left 02/04/2020   Procedure: CYSTOSCOPY WITH RETROGRADE PYELOGRAM/URETERAL STENT PLACEMENT;  Surgeon: Bjorn Pippin, MD;  Location: [facility];  Service: Urology;  Laterality: Left;   EXTRACORPOREAL SHOCK WAVE LITHOTRIPSY Left 03/03/2020   Procedure: EXTRACORPOREAL SHOCK WAVE LITHOTRIPSY (ESWL);  Surgeon: Ihor Gully, MD;  Location: [facility];  Service: Urology;  Laterality: Left;   EYE SURGERY Right    cataract removal   GASTRIC BYPASS  03/2004   HEMORRHOID SURGERY  2010   HERNIA REPAIR     KNEE SURGERY  1980, 1985, 2015   left shoulder surgery  2007   LUNG  SURGERY Left 2002   PROSTATE SURGERY     SPINAL CORD STIMULATOR REMOVAL N/A 01/02/2020   Procedure: Spinal cord stimulator removal  Lumbar;  Surgeon: Tia Alert, MD;  Location: [facility] OR;  Service: Neurosurgery;  Laterality: N/A;   SPINE SURGERY     spinal stimulator   TONSILECTOMY/ADENOIDECTOMY WITH MYRINGOTOMY     TONSILLECTOMY     TOTAL KNEE ARTHROPLASTY Right 10/18/2023   Procedure: TOTAL KNEE ARTHROPLASTY;  Surgeon: Sheral Apley, MD;  Location: [facility];  Service: Orthopedics;  Laterality: Right;    SOCIAL HISTORY: Social History   Socioeconomic History   Marital status: Married    Spouse name:  Mary   Number of children: Not on file   Years of education: Not on file   Highest education level: Not on file  Occupational History   Not on file  Tobacco Use   Smoking status: Former    Current packs/day: 0.00    Types: Cigarettes    Quit date: 02/14/2001    Years since quitting: 22.8   Smokeless tobacco: Never  Vaping Use   Vaping status: Never Used  Substance and Sexual Activity   Alcohol use: No   Drug use: No   Sexual activity: Not on file  Other Topics Concern   Not on file  Social History Narrative   Lives with wife   Social Drivers of Health   Financial Resource Strain: Low Risk  (11/11/2022)   Received [facility]   Overall Financial Resource Strain (CARDIA)    Difficulty of Paying Living Expenses: Not hard at all  Food Insecurity: No Food Insecurity (10/18/2023)   Hunger Vital Sign    Worried About Running Out of Food in the Last Year: Never true    Ran Out of Food in the Last Year: Never true  Transportation Needs: No Transportation Needs (10/18/2023)   PRAPARE - Administrator, Civil Service (Medical): No    Lack of Transportation (Non-Medical): No  Physical Activity: Not on file  Stress: Not on file  Social Connections: Unknown (10/26/2022)   Received from [facility]   Social Network    Social Network: Not on file  Intimate Partner Violence: Not At Risk (10/18/2023)   Humiliation, Afraid, Rape, and Kick questionnaire    Fear of Current or Ex-Partner: No    Emotionally Abused: No    Physically Abused: No    Sexually Abused: No    FAMILY HISTORY: Family History  Problem Relation Age of Onset   Breast cancer Mother    Diabetes Father    Hypertension Father    Stroke Father    Parkinson's disease Father    Brain cancer Maternal Uncle     ALLERGIES:  is allergic to aminoglycosides, oxycontin [oxycodone], and reglan [metoclopramide].  MEDICATIONS:  Current Outpatient Medications  Medication Sig  Dispense Refill   acetaminophen (TYLENOL) 500 MG tablet Take 2 tablets (1,000 mg total) by mouth every 6 (six) hours as needed for mild pain (pain score 1-3) or moderate pain (pain score 4-6). 60 tablet 0   acyclovir (ZOVIRAX) 400 MG tablet Take 400 mg by mouth daily.     albuterol (PROVENTIL HFA;VENTOLIN HFA) 108 (90 Base) MCG/ACT inhaler Inhale 2 puffs into the lungs every 4 (four) hours as needed for wheezing or shortness of breath.     apixaban (ELIQUIS) 2.5 MG TABS tablet Take 1 tablet (2.5 mg total) by mouth 2 (two) times daily. To prevent  blood clots after surgery 60 tablet 0   atorvastatin (LIPITOR) 40 MG tablet Take 40 mg by mouth at bedtime.      cyanocobalamin (,VITAMIN B-12,) 1000 MCG/ML injection Inject 1 mL (1,000 mcg total) into the muscle every 30 (thirty) days. 3 mL 1   fluticasone (FLONASE) 50 MCG/ACT nasal spray Place 2 sprays into both nostrils daily. 16 g 11   Galcanezumab-gnlm (EMGALITY) 120 MG/ML SOAJ Inject 120 mg into the skin every 30 (thirty) days. 1.12 mL 3   lansoprazole (PREVACID) 30 MG capsule Take 30 mg by mouth 2 (two) times daily.     levothyroxine (SYNTHROID) 125 MCG tablet Take 125 mcg by mouth daily before breakfast.     methocarbamol (ROBAXIN-750) 750 MG tablet Take 1 tablet (750 mg total) by mouth every 8 (eight) hours as needed for muscle spasms. DO NOT TAKE BACLOFEN WHILE TAKING THIS MEDICINE! 20 tablet 0   montelukast (SINGULAIR) 10 MG tablet Take 10 mg by mouth daily.      ondansetron (ZOFRAN) 8 MG tablet Take 1 tablet (8 mg total) by mouth every 8 (eight) hours as needed for nausea or vomiting. 20 tablet 1   RESTASIS 0.05 % ophthalmic emulsion Place 1 drop into both eyes 2 (two) times daily.     Rimegepant Sulfate (NURTEC) 75 MG TBDP Take 75 mg by mouth as needed. 16 tablet 11   sertraline (ZOLOFT) 100 MG tablet Take 100 mg by mouth daily.     sucralfate (CARAFATE) 1 GM/10ML suspension Take 1 g by mouth in the morning, at noon, and at bedtime.      Tapentadol HCl (NUCYNTA) 100 MG TABS Take 1 tablet (100 mg total) by mouth in the morning, at noon, in the evening, and at bedtime. (Patient taking differently: Take 100 mg by mouth in the morning, at noon, in the evening, and at bedtime. 75 mg qid) 30 tablet 0   tapentadol HCl (NUCYNTA) 75 MG tablet Take 75 mg by mouth in the morning, at noon, in the evening, and at bedtime.     testosterone cypionate (DEPOTESTOSTERONE CYPIONATE) 200 MG/ML injection Inject 200 mg into the muscle every 30 (thirty) days.     topiramate (TOPAMAX) 100 MG tablet Take 1 tablet (100 mg total) by mouth at bedtime. 90 tablet 3   traZODone (DESYREL) 100 MG tablet Take 100 mg by mouth at bedtime.      Ubrogepant (UBRELVY) 100 MG TABS Take 100 mg by mouth daily as needed (migaine).     No current facility-administered medications for this visit.    REVIEW OF SYSTEMS:   Respiratory: Denies shortness of breath Cardiovascular: Denies chest pain, chest discomfort Gastrointestinal:  Denies nausea, abdominal pain, melena or bloody stools GU: no hematuria Skin: skin color change All other systems were reviewed with the patient and are negative.  PHYSICAL EXAMINATION: ECOG PERFORMANCE STATUS: 1 - Symptomatic but completely ambulatory  Vitals:   12/22/23 1043  BP: 105/69  Pulse: 77  Resp: 16  Temp: 97.6 F (36.4 C)  SpO2: 97%    Filed Weights   12/22/23 1043  Weight: 150 lb 12.8 oz (68.4 kg)     GENERAL: alert, no distress and comfortable SKIN: skin color normal

## 2023-12-21 NOTE — Assessment & Plan Note (Signed)
 Will check b12 and ferritin periodically. Continue B12 monthly

## 2023-12-21 NOTE — Assessment & Plan Note (Signed)
 H/o gastric bypass Repeat lab in 6 months

## 2023-12-22 ENCOUNTER — Inpatient Hospital Stay: Payer: Medicare Other

## 2023-12-22 VITALS — BP 105/69 | HR 77 | Temp 97.6°F | Resp 16 | Ht 67.0 in | Wt 150.8 lb

## 2023-12-22 DIAGNOSIS — D508 Other iron deficiency anemias: Secondary | ICD-10-CM

## 2023-12-22 DIAGNOSIS — Z9884 Bariatric surgery status: Secondary | ICD-10-CM

## 2023-12-22 DIAGNOSIS — D509 Iron deficiency anemia, unspecified: Secondary | ICD-10-CM | POA: Diagnosis not present

## 2024-01-02 ENCOUNTER — Encounter: Payer: Self-pay | Admitting: Adult Health

## 2024-01-02 ENCOUNTER — Ambulatory Visit: Payer: Medicare Other | Admitting: Adult Health

## 2024-01-02 VITALS — BP 97/55 | HR 71 | Ht 67.0 in | Wt 151.0 lb

## 2024-01-02 DIAGNOSIS — Z820 Family history of epilepsy and other diseases of the nervous system: Secondary | ICD-10-CM

## 2024-01-02 DIAGNOSIS — G43119 Migraine with aura, intractable, without status migrainosus: Secondary | ICD-10-CM

## 2024-01-02 DIAGNOSIS — G20C Parkinsonism, unspecified: Secondary | ICD-10-CM

## 2024-01-02 MED ORDER — TOPIRAMATE 100 MG PO TABS: 100.0000 mg | ORAL_TABLET | Freq: Every day | ORAL | 3 refills | Status: DC

## 2024-01-02 MED ORDER — UBRELVY 100 MG PO TABS: 100.0000 mg | ORAL_TABLET | Freq: Every day | ORAL | 11 refills | Status: DC | PRN

## 2024-01-02 MED ORDER — EMGALITY 120 MG/ML ~~LOC~~ SOAJ: 120.0000 mg | SUBCUTANEOUS | 11 refills | Status: DC

## 2024-01-02 NOTE — Patient Instructions (Addendum)
 Your Plan:  Continue Emgality and topamax for headache prevention  Continue Ubrelvy as needed for migraine rescue  Order placed to complete a DAT scan to evaluate for parkinsonism - you will be called to schedule     Will try to get you established with a new neurologist for further evaluation of possible parkinsons disease       Thank you for coming to see [place] at [facility]. I hope we have been able to provide you high quality care today.  You may receive a patient satisfaction survey over the next few weeks. We would appreciate your feedback and comments so that we may continue to improve ourselves and the health of our patients.    Parkinson's Disease Parkinson's disease causes problems with movement. It makes it harder for you to walk or control your body. Each person with the disease is affected differently. Treatments can help you manage your symptoms. Parkinson's disease can range from mild to very bad, but it gets worse over time. This often happens slowly over many years. What are the causes? Parkinson's disease is caused by a loss of brain cells called neurons. These brain cells make a substance called dopamine, which is needed to control body movement. It's not known what causes the brain cells to die. What increases the risk? Being male. Being 85 years of age or older. Having a family history of Parkinson's disease. Having an injury to the brain in the past. Being around things that are harmful or poisonous, such as pesticides. Having depression. This is when you feel sad or hopeless. What are the signs or symptoms? Symptoms can vary and get worse over time. The main symptoms can be seen in your movement. These include: Shaking or tremors that you can't control. This happens while you're resting. Stiffness in your arms and legs. Losing facial expressions. Walking in a way that isn't normal. You may walk with short, shuffling steps. Loss of  balance when standing. You may sway, fall backward, or have trouble making turns. Other symptoms include: Being very sad, worried, or nervous. Having delusions. These are strong beliefs that aren't true. Having hallucinations. This is when you see, hear, taste, smell, or feel things that aren't real. Trouble speaking or swallowing. Trouble pooping (constipation). Needing to pee (urinate) right away, peeing often, or losing control of when you pee or poop. Sleep problems. How is this diagnosed? A diagnosis may be made based on symptoms, your medical history, and a physical exam. You may also have imaging tests that make pictures of your brain. How is this treated? There is no cure for Parkinson's disease. The goal of treatment is to manage your symptoms. It may include: Medicines. Therapy to help with talking or movement. Surgery to reduce shaking and other movements that you can't control. Follow these instructions at home: Medicines Take your medicines only as told by your health care provider. Avoid taking pain or sleeping medicines. These can affect your thinking. Activity Ask your provider if it's safe for you to drive. Exercise as told by your provider or physical therapist. Lifestyle  Put grab bars and railings in your home, especially near the toilet and in the shower. These help prevent falls. Do not smoke, vape, or use products with nicotine or tobacco in them. If you need help quitting, talk with your provider. Do not drink alcohol. General instructions Talk with your provider to find out what kind of help you need at home. Ask about ways to stay  safe. Join a support group for people with Parkinson's disease. Where to find more information [employer] of Neurological Disorders and Stroke: [employer] Parkinson's Foundation: parkinson.org Contact a health care provider if: Medicines don't help your symptoms. You have a lot of side effects from your  medicines. You keep losing your balance or you fall. You need more help at home. You have trouble swallowing. You have a very hard time pooping. You feel very sad, worried, or confused. You see, hear, taste, smell, or feel things that aren't real. Get help right away if: You were hurt in a fall. You can't swallow without choking. You have chest pain or trouble breathing. You don't feel safe at home. These symptoms may be an emergency. Call 911 right away. Do not wait to see if the symptoms will go away. Do not drive yourself to the hospital. Also, get help right away if: You feel like you may hurt yourself or others. You have thoughts about taking your own life. Take one of these steps: Go to your nearest emergency room. Call 911. Call the National Suicide Prevention Lifeline at 570-392-3615 or 988. Text the Crisis Text Line at (605) 578-7712. This information is not intended to replace advice given to you by your health care provider. Make sure you discuss any questions you have with your health care provider. Document Revised: 06/16/2023 Document Reviewed: 11/29/2022 Elsevier Patient Education  2024 [employer].

## 2024-01-02 NOTE — Progress Notes (Signed)
 Referring:  Frederich Chick., MD [address] [place],  [place] [address]  PCP: Frederich Chick., MD  Neurology was asked to evaluate Ryan Nicholson, a 63 year old male for a chief complaint of headaches.  Our recommendations of care will be communicated by shared medical record.    CC:  headaches Chief Complaint  Patient presents with   Headache    Mr 8 with spouse Pt is well, reports he has about 5-6 headaches a month.  No other concerns.      History provided from self   Follow-up Visit   Last visit: 12/13/2022   Brief HPI: 63 year old male with a history of TIA, gastric bypass, migraines, nephrolithiasis, postherpetic neuralgia, chronic back pain s/p spinal cord stimulator who presents for follow up of migraines. MRI brain 12/27/21 was unremarkable other than mild chronic microvascular ischemia. CTA head/neck 04/2019 showed no significant vessel stenosis.  Reported new right sided migraine in 09/2022 associated with facial numbness, drooping and blurred vision, MRI brain unremarkable and improved after initiating topiramate in addition to [employer].   At prior visit, migraines well-controlled on Emgality and topiramate for prevention and Nurtec for rescue.  Mentioned occasional transient OD vision loss and recommended evaluation with ophthalmology and consider further evaluation if ophthalmology exam unrevealing.    Interval History:  Returns for follow-up visit accompanied by his wife.  Currently experiencing about 5-6 migraines per month. Did have knee replacement in January with continued pain and reports some increased migraine frequency since that time.  Remains on Emgality and topiramate. Has been using Ubrelvy as needed which has been more helpful than Nurtec.  Wife mentions frequent grinding of his teeth and clenching of jaw. He is aware of this and tries to stop. He also mentions left hand tremor at rest and with activity, he is unsure of duration but more  noticeable over the past year.  He does report Parkinson's disease in his father and brother. Gait impaired but does have chronic low back pain s/p spinal cord stimulator and recent knee replacement, ambulates without AD.      Headache days per month: 5-6   Current Headache Regimen: Preventative: Emgality 120 mg monthly.  Topiramate 100mg  nightly Abortive: Ubrelvy     Prior Therapies                                  Topamax 100 mg QHS  Emgality 120 mg monthly Zoloft 100 mg daily Imitrex 100 mg PRN Ubrelvy 100 mg PRN Baclofen 10 mg PRN Nurtec    LABS: CBC    Component Value Date/Time   WBC 5.9 12/20/2023 1216   WBC 7.2 10/10/2023 1320   RBC 4.14 (L) 12/20/2023 1216   HGB 12.7 (L) 12/20/2023 1216   HCT 38.8 (L) 12/20/2023 1216   PLT 211 12/20/2023 1216   MCV 93.7 12/20/2023 1216   MCH 30.7 12/20/2023 1216   MCHC 32.7 12/20/2023 1216   RDW 13.8 12/20/2023 1216   LYMPHSABS 1.0 12/20/2023 1216   MONOABS 0.4 12/20/2023 1216   EOSABS 0.1 12/20/2023 1216   BASOSABS 0.1 12/20/2023 1216      Latest Ref Rng & Units 12/29/2021    2:50 PM 08/03/2020    3:17 PM 02/05/2020    3:50 AM  CMP  Glucose 70 - 99 mg/dL 829  98  562   BUN 8 - 23 mg/dL 14  15  19   Creatinine 0.61 - 1.24 mg/dL 2.13  0.86  5.78   Sodium 135 - 145 mmol/L 137  139  138   Potassium 3.5 - 5.1 mmol/L 4.2  4.4  4.8   Chloride 98 - 111 mmol/L 107  109  109   CO2 22 - 32 mmol/L 24  24  22    Calcium 8.9 - 10.3 mg/dL 9.2  9.0  8.7   Total Protein 6.5 - 8.1 g/dL 6.9     Total Bilirubin 0.3 - 1.2 mg/dL 0.8     Alkaline Phos 38 - 126 U/L 62     AST 15 - 41 U/L 27     ALT 0 - 44 U/L 16        IMAGING:  MRI brain 12/29/21: No acute intracranial abnormality. Mild white matter changes likely chronic microvascular ischemia.  Imaging independently reviewed on January 02, 2024   Current Outpatient Medications on File Prior to Visit  Medication Sig Dispense Refill   acetaminophen (TYLENOL) 500 MG tablet Take 2  tablets (1,000 mg total) by mouth every 6 (six) hours as needed for mild pain (pain score 1-3) or moderate pain (pain score 4-6). 60 tablet 0   acyclovir (ZOVIRAX) 400 MG tablet Take 400 mg by mouth daily.     albuterol (PROVENTIL HFA;VENTOLIN HFA) 108 (90 Base) MCG/ACT inhaler Inhale 2 puffs into the lungs every 4 (four) hours as needed for wheezing or shortness of breath.     atorvastatin (LIPITOR) 40 MG tablet Take 40 mg by mouth at bedtime.      cyanocobalamin (,VITAMIN B-12,) 1000 MCG/ML injection Inject 1 mL (1,000 mcg total) into the muscle every 30 (thirty) days. 3 mL 1   fluticasone (FLONASE) 50 MCG/ACT nasal spray Place 2 sprays into both nostrils daily. 16 g 11   lansoprazole (PREVACID) 30 MG capsule Take 30 mg by mouth 2 (two) times daily.     levothyroxine (SYNTHROID) 125 MCG tablet Take 125 mcg by mouth daily before breakfast.     methocarbamol (ROBAXIN-750) 750 MG tablet Take 1 tablet (750 mg total) by mouth every 8 (eight) hours as needed for muscle spasms. DO NOT TAKE BACLOFEN WHILE TAKING THIS MEDICINE! 20 tablet 0   montelukast (SINGULAIR) 10 MG tablet Take 10 mg by mouth daily.      ondansetron (ZOFRAN) 8 MG tablet Take 1 tablet (8 mg total) by mouth every 8 (eight) hours as needed for nausea or vomiting. 20 tablet 1   RESTASIS 0.05 % ophthalmic emulsion Place 1 drop into both eyes 2 (two) times daily.     sertraline (ZOLOFT) 100 MG tablet Take 100 mg by mouth daily.     sucralfate (CARAFATE) 1 GM/10ML suspension Take 1 g by mouth in the morning, at noon, and at bedtime.     tapentadol HCl (NUCYNTA) 75 MG tablet Take 75 mg by mouth in the morning, at noon, in the evening, and at bedtime.     testosterone cypionate (DEPOTESTOSTERONE CYPIONATE) 200 MG/ML injection Inject 200 mg into the muscle every 30 (thirty) days.     traZODone (DESYREL) 100 MG tablet Take 100 mg by mouth at bedtime.      No current facility-administered medications on file prior to visit.      Allergies: Allergies  Allergen Reactions   Aminoglycosides    Oxycontin [Oxycodone] Other (See Comments)    Unknown   Reglan [Metoclopramide] Nausea Only    shakey    Family History:  Family History  Problem Relation Age of Onset   Breast cancer Mother    Diabetes Father    Hypertension Father    Stroke Father    Parkinson's disease Father    Brain cancer Maternal Uncle      Past Medical History: Past Medical History:  Diagnosis Date   B12 deficiency    Depression    Dizziness    DJD (degenerative joint disease)    Gastric ulcer    hx of   GERD (gastroesophageal reflux disease)    History of kidney stones    Hypercholesteremia    Hypothyroidism    Migraine    Pneumonia    Post herpetic neuralgia    Stroke (HCC)    mild 2020 (weak right side), short term memory loss    Past Surgical History Past Surgical History:  Procedure Laterality Date   BACK SURGERY  09/1999, 01/2001   CHOLECYSTECTOMY  2005   COLON SURGERY     CYSTOSCOPY W/ URETERAL STENT PLACEMENT Left 02/04/2020   Procedure: CYSTOSCOPY WITH RETROGRADE PYELOGRAM/URETERAL STENT PLACEMENT;  Surgeon: Bjorn Pippin, MD;  Location: [facility];  Service: Urology;  Laterality: Left;   EXTRACORPOREAL SHOCK WAVE LITHOTRIPSY Left 03/03/2020   Procedure: EXTRACORPOREAL SHOCK WAVE LITHOTRIPSY (ESWL);  Surgeon: Ihor Gully, MD;  Location: [facility];  Service: Urology;  Laterality: Left;   EYE SURGERY Right    cataract removal   GASTRIC BYPASS  03/2004   HEMORRHOID SURGERY  2010   HERNIA REPAIR     KNEE SURGERY  1980, 1985, 2015   left shoulder surgery  2007   LUNG SURGERY Left 2002   PROSTATE SURGERY     SPINAL CORD STIMULATOR REMOVAL N/A 01/02/2020   Procedure: Spinal cord stimulator removal  Lumbar;  Surgeon: Tia Alert, MD;  Location: [facility];  Service: Neurosurgery;  Laterality: N/A;   SPINE SURGERY     spinal stimulator   TONSILECTOMY/ADENOIDECTOMY WITH MYRINGOTOMY     TONSILLECTOMY      TOTAL KNEE ARTHROPLASTY Right 10/18/2023   Procedure: TOTAL KNEE ARTHROPLASTY;  Surgeon: Sheral Apley, MD;  Location: [facility];  Service: Orthopedics;  Laterality: Right;    Social History: Social History   Tobacco Use   Smoking status: Former    Current packs/day: 0.00    Types: Cigarettes    Quit date: 02/14/2001    Years since quitting: 22.8   Smokeless tobacco: Never  Vaping Use   Vaping status: Never Used  Substance Use Topics   Alcohol use: No   Drug use: No     ROS: Negative for fevers, chills. Positive for headaches. All other systems reviewed and negative unless stated otherwise in HPI.   Physical Exam:   Vital Signs: BP (!) 97/55 Comment: manual  Pulse 71   Ht 5\' 7"  (1.702 m)   Wt 151 lb (68.5 kg)   BMI 23.65 kg/m  GENERAL: well appearing, very pleasant middle aged male, in no acute distress,alert SKIN:  Color, texture, turgor normal. No rashes or lesions HEAD:  Normocephalic/atraumatic.  No temporal pain with pressure CV:  RRR RESP: Normal respiratory effort MSK: forward curvature of cervical spine  NEUROLOGICAL: Mental Status: Alert, oriented to person, place and time,Follows commands. No evidence of aphasia or dysarthria, mild hypophonia. Mild facial masking.  Cranial Nerves: PERRL, visual fields intact to confrontation, extraocular movements intact, facial sensation intact, no facial droop or ptosis, hearing grossly intact, no dysarthria Motor: muscle strength 5/5 both upper and lower  extremities; unable to appreciate resting tremor today, mild to mod postural tremor of LUE; cogwheel rigidity and bradykinesia LUE Reflexes: 2+ throughout Sensation: intact to light touch all 4 extremities Coordination: Finger-to- nose-finger intact bilaterally Gait: stands from seated position without difficulty. Mild curvature of spine. Gait with slightly decreased stride length and step height, decreased L>R arm swing, mild favoring of LLE 2/2 recent knee  replacement, mild unsteadiness, no use of AD      IMPRESSION: 63 year old male with a history of TIA, migraines, nephrolithiasis, postherpetic neuralgia, gastric bypass, chronic back pain s/p spinal cord stimulator who presents for follow up of left-sided migraines, initially seen by Dr. Delena Bali 03/16/2022. He has had significant improvement in his headaches with Emgality.  Reported onset of right sided migraine headaches associated with facial numbness/drooping and blurred vision around 09/2022, MRI brain unremarkable for acute findings, ESR and CRP negative.  Restarted topiramate in addition to [facility] with improvement, some mild worsening of migraines after recent knee replacement otherwise well controlled. Today, reports LUE resting and action tremor over the past year. Notes hx of Parkinsons in his father and brother.      PLAN:  Migraine -some mild worsening likely post knee replacement with increased pain, advised to call if headaches persist after improvement/recovery from replacement -Prevention: Continue Emgality 120 mg monthly and topiramate 100 mg nightly.   -Rescue: Continue Ubrelvy as needed   Parkinsonism  -strong family history (father and brother) -Exam notable to Parkinsonism features including LUE postural tremor with rigidity and bradykinesia, hypomimia, gait impairment with decreased arm swing and kyphosis.  -discussed options including pursing DaTscan vs starting on symptomatic treatment, patient prefers to pursue DaTscan first, orders will be placed -will set up follow up visit to establish care with new MD for further evaluation, wife requesting establishing care with Dr. Lucia Gaskins, previously followed by Dr. Delena Bali,  will reach out to Dr. Lucia Gaskins prior to scheduling        I spent 40 minutes of face-to-face and non-face-to-face time with patient and wife.  This included previsit chart review, lab review, study review, order entry, electronic health record documentation,  patient and wife education and discussion regarding the above and answered all other questions to patient and wife's satisfaction  Ihor Austin, AGNP-BC  [facility] [address] [place], [place] [address]  Phone [phone] Fax [phone] Note: This document was prepared with digital dictation and possible smart phrase technology. Any transcriptional errors that result from this process are unintentional.

## 2024-01-03 ENCOUNTER — Telehealth: Payer: Self-pay | Admitting: Adult Health

## 2024-01-03 NOTE — Progress Notes (Signed)
 Please contact patient to schedule with Dr. Marjory Lies for evaluation of possible Parkinson's disease. Wife requested establishing care with Dr. Lucia Gaskins but she advised to see Dr. Marjory Lies as patient has previously been seen by him. Thank you.

## 2024-01-03 NOTE — Telephone Encounter (Signed)
 [employer] medicare Berkley Harvey: [id number] exp. 01/03/24-02/17/24 sent to [facility] [phone]

## 2024-01-03 NOTE — Telephone Encounter (Signed)
 From Shanda Bumps, "Please contact patient to schedule with Dr. Marjory Lies for evaluation of possible Parkinson's disease. Wife requested establishing care with Dr. Lucia Gaskins but she advised to see Dr. Marjory Lies as patient has previously been seen by him." LVM asking pt to call back to schedule appointment.  If patient calls back please schedule him for first available appointment with Dr. Marjory Lies

## 2024-01-26 ENCOUNTER — Ambulatory Visit (HOSPITAL_COMMUNITY)
Admission: RE | Admit: 2024-01-26 | Discharge: 2024-01-26 | Disposition: A | Discharge status: Home / Self Care | Source: Ambulatory Visit | Attending: Adult Health | Admitting: Adult Health

## 2024-01-26 DIAGNOSIS — G20C Parkinsonism, unspecified: Secondary | ICD-10-CM | POA: Insufficient documentation

## 2024-01-26 MED ORDER — IOFLUPANE I 123 185 MBQ/2.5ML IV SOLN
4.7200 | Freq: Once | INTRAVENOUS | Status: AC | PRN
Start: 2024-01-26 — End: 2024-01-26
  Administered 2024-01-26: 4.72 via INTRAVENOUS

## 2024-01-26 MED ORDER — POTASSIUM IODIDE (ANTIDOTE) 130 MG PO TABS
ORAL_TABLET | ORAL | Status: AC
  Filled 2024-01-26: qty 1

## 2024-01-30 ENCOUNTER — Telehealth: Payer: Self-pay | Admitting: Neurology

## 2024-01-30 NOTE — Telephone Encounter (Signed)
 Called the patient's wife (phone number on file) to review DAT scan results. There was no answer. LVM advising I had results to review and that I will send a mychart message as well. Instructed to call back with questions.

## 2024-01-30 NOTE — Telephone Encounter (Signed)
-----   Message from [place] sent at 01/26/2024  4:15 PM EDT ----- Please advise patient/wife that recent DaTscan  overall was normal, no abnormality to suggest Parkinson syndrome pathology although this does not 100% rule out parkinsonism.  It was recommended he be scheduled with Dr. Salli Crawley for further evaluation, prior patient of Dr. Billy Bue, although has seen Dr. Salli Crawley in the past. Our office tried to contact patient to schedule follow up visit but unable to reach. Please ensure patient is scheduled with Dr. Salli Crawley at next available. Thank you!

## 2024-02-01 NOTE — Telephone Encounter (Signed)
 Spoke to patient gave DAT scan results. Per Jessica,NP Please f/u with Dr Salli Crawley in 6months to a 1 year Pt expressed understanding and thanked me for calling

## 2024-02-02 ENCOUNTER — Ambulatory Visit: Payer: Self-pay

## 2024-02-02 NOTE — Telephone Encounter (Signed)
 Chief Complaint: Left lower abdominal pain radiating to groin Symptoms: 7-8/10 Frequency: Onset Monday Pertinent Negatives: Patient denies other symptoms Disposition: [x] ED /[] Urgent Care (no appt availability in office) / [] Appointment(In office/virtual)/ []  [facility]/ [] Home Care/ [] Refused Recommended Disposition /[] [facility]/ []  Follow-up with PCP Additional Notes: Patient says he was helping his son move some totes to the attic on Sunday and he felt something to the left lower abdomen, but the pain started on Monday and has been constant. He says the area is protruding out, so he thinks it may be a hernia. Advised ED for evaluation, he agreed to go. Scheduled NP appointment with Dr. Peru in July per his request, looked on the [facility] website and there were openings in July, so he was scheduled and advised.    Copied from CRM [id number]. Topic: Clinical - Red Word Triage >> Feb 02, 2024  4:23 PM Ethelle Herb L wrote: Red Word that prompted transfer to Nurse Triage: possible hernia, having pain in lower abdomen and groin Reason for Disposition  [1] SEVERE pain AND [2] age > 60 years  Answer Assessment - Initial Assessment Questions 1. LOCATION: "Where does it hurt?"      Left lower abdomen above groin 2. RADIATION: "Does the pain shoot anywhere else?" (e.g., chest, back)     To the groin into testicle 3. ONSET: "When did the pain begin?" (Minutes, hours or days ago)      This past Monday 4. SUDDEN: "Gradual or sudden onset?"     Sudden onset after lifting heavy totes felt pulling, then pain on Monday 5. PATTERN "Does the pain come and go, or is it constant?"    - If it comes and goes: "How long does it last?" "Do you have pain now?"     (Note: Comes and goes means the pain is intermittent. It goes away completely between bouts.)    - If constant: "Is it getting better, staying the same, or getting worse?"      (Note: Constant means the pain never goes away completely;  most serious pain is constant and gets worse.)      Constant 6. SEVERITY: "How bad is the pain?"  (e.g., Scale 1-10; mild, moderate, or severe)    - MILD (1-3): Doesn't interfere with normal activities, abdomen soft and not tender to touch.     - MODERATE (4-7): Interferes with normal activities or awakens from sleep, abdomen tender to touch.     - SEVERE (8-10): Excruciating pain, doubled over, unable to do any normal activities.       7-8 7. RECURRENT SYMPTOM: "Have you ever had this type of stomach pain before?" If Yes, ask: "When was the last time?" and "What happened that time?"      No, had hernia same side before years ago 8. CAUSE: "What do you think is causing the stomach pain?"     Possible hernia 9. RELIEVING/AGGRAVATING FACTORS: "What makes it better or worse?" (e.g., antacids, bending or twisting motion, bowel movement)     Walking makes it worse, sitting 10. OTHER SYMPTOMS: "Do you have any other symptoms?" (e.g., back pain, diarrhea, fever, urination pain, vomiting)      Bulges above groin  Protocols used: Abdominal Pain - Male-A-AH

## 2024-02-27 ENCOUNTER — Encounter (HOSPITAL_BASED_OUTPATIENT_CLINIC_OR_DEPARTMENT_OTHER): Payer: Self-pay

## 2024-04-18 ENCOUNTER — Ambulatory Visit (HOSPITAL_BASED_OUTPATIENT_CLINIC_OR_DEPARTMENT_OTHER): Admitting: Family Medicine

## 2024-04-19 ENCOUNTER — Ambulatory Visit (HOSPITAL_BASED_OUTPATIENT_CLINIC_OR_DEPARTMENT_OTHER): Admitting: Family Medicine

## 2024-05-01 LAB — MICROALBUMIN / CREATININE URINE RATIO: Microalb Creat Ratio: 3

## 2024-05-16 ENCOUNTER — Ambulatory Visit (HOSPITAL_BASED_OUTPATIENT_CLINIC_OR_DEPARTMENT_OTHER): Admitting: Family Medicine

## 2024-05-25 ENCOUNTER — Telehealth: Payer: Self-pay | Admitting: Internal Medicine

## 2024-05-25 NOTE — Telephone Encounter (Signed)
 Patient came in with wife,stating he was verbally told that Dr.Gherghe would take him on as a new patient.There is no documentation stating anything as such.Please advise as the patient's wife is in the office now for lab work and they are wanting to schedule an appointment ASAP.

## 2024-05-29 ENCOUNTER — Ambulatory Visit: Admitting: Internal Medicine

## 2024-05-29 ENCOUNTER — Encounter: Payer: Self-pay | Admitting: Internal Medicine

## 2024-05-29 VITALS — BP 120/70 | HR 90 | Ht 67.0 in | Wt 147.8 lb

## 2024-05-29 DIAGNOSIS — E039 Hypothyroidism, unspecified: Secondary | ICD-10-CM | POA: Diagnosis not present

## 2024-05-29 DIAGNOSIS — R7303 Prediabetes: Secondary | ICD-10-CM | POA: Diagnosis not present

## 2024-05-29 MED ORDER — ACCU-CHEK GUIDE W/DEVICE KIT: PACK | 0 refills | Status: AC

## 2024-05-29 MED ORDER — ACCU-CHEK SOFTCLIX LANCETS MISC: 3 refills | Status: AC

## 2024-05-29 MED ORDER — GLUCOSE BLOOD VI STRP: ORAL_STRIP | 3 refills | Status: AC

## 2024-05-29 NOTE — Patient Instructions (Addendum)
 Please continue levothyroxine  125 mcg daily.  Take the thyroid  hormone every day, with water , at least 30 minutes before breakfast, separated by at least 4 hours from: - acid reflux medications - calcium  - iron - multivitamins  Please stop the Biotin for 2 days and come back for labs.  Check blood sugars whenever you feel poorly.  NO SWEET DRINKS!  Please return in 3-4 month.  Hypothyroidism Hypothyroidism is when the thyroid  gland does not make enough of certain hormones. This is called an underactive thyroid . The thyroid  gland is a small gland located in the lower front part of the neck, just in front of the windpipe (trachea). This gland makes hormones that help control how the body uses food for energy (metabolism) as well as how the heart and brain function. These hormones also play a role in keeping your bones strong. When the thyroid  is underactive, it produces too little of the hormones thyroxine (T4) and triiodothyronine (T3). What are the causes? This condition may be caused by: Hashimoto's disease. This is a disease in which the body's disease-fighting system (immune system) attacks the thyroid  gland. This is the most common cause. Viral infections. Pregnancy. Certain medicines. Birth defects. Problems with a gland in the center of the brain (pituitary gland). Lack of enough iodine  in the diet. Other causes may include: Past radiation treatments to the head or neck for cancer. Past treatment with radioactive iodine . Past exposure to radiation in the environment. Past surgical removal of part or all of the thyroid . What increases the risk? You are more likely to develop this condition if: You are male. You have a family history of thyroid  conditions. You use a medicine called lithium. You take medicines that affect the immune system (immunosuppressants). What are the signs or symptoms? Common symptoms of this condition include: Not being able to tolerate  cold. Feeling as though you have no energy (lethargy). Lack of appetite. Constipation. Sadness or depression. Weight gain that is not explained by a change in diet or exercise habits. Menstrual irregularity. Dry skin, coarse hair, or brittle nails. Other symptoms may include: Muscle pain. Slowing of thought processes. Poor memory. How is this diagnosed? This condition may be diagnosed based on: Your symptoms, your medical history, and a physical exam. Blood tests. You may also have imaging tests, such as an ultrasound or MRI. How is this treated? This condition is treated with medicine that replaces the thyroid  hormones that your body does not make. After you begin treatment, it may take several weeks for symptoms to go away. Follow these instructions at home: Take over-the-counter and prescription medicines only as told by your health care provider. If you start taking any new medicines, tell your health care provider. Keep all follow-up visits as told by your health care provider. This is important. As your condition improves, your dosage of thyroid  hormone medicine may change. You will need to have blood tests regularly so that your health care provider can monitor your condition. Contact a health care provider if: Your symptoms do not get better with treatment. You are taking thyroid  hormone replacement medicine and you: Sweat a lot. Have tremors. Feel anxious. Lose weight rapidly. Cannot tolerate heat. Have emotional swings. Have diarrhea. Feel weak. Get help right away if: You have chest pain. You have an irregular heartbeat. You have a rapid heartbeat. You have difficulty breathing. These symptoms may be an emergency. Get help right away. Call 911. Do not wait to see if the symptoms will go  away. Do not drive yourself to the hospital. Summary Hypothyroidism is when the thyroid  gland does not make enough of certain hormones (it is underactive). When the thyroid  is  underactive, it produces too little of the hormones thyroxine (T4) and triiodothyronine (T3). The most common cause is Hashimoto's disease, a disease in which the body's disease-fighting system (immune system) attacks the thyroid  gland. The condition can also be caused by viral infections, medicine, pregnancy, or past radiation treatment to the head or neck. Symptoms may include weight gain, dry skin, constipation, feeling as though you do not have energy, and not being able to tolerate cold. This condition is treated with medicine to replace the thyroid  hormones that your body does not make. This information is not intended to replace advice given to you by your health care provider. Make sure you discuss any questions you have with your health care provider. Document Revised: 09/15/2021 Document Reviewed: 09/15/2021 Elsevier Patient Education  2024 [employer]  Prediabetes: What to Know Prediabetes is when your blood sugar, also called glucose, is at a higher level than normal but not high enough for you to be diagnosed with type 2 diabetes (type 2 diabetes mellitus). Having prediabetes puts you at risk for getting type 2 diabetes. By making some healthy changes, you may be able to prevent or delay getting type 2 diabetes. This is important because type 2 diabetes can lead to serious problems. Some of these include: Heart disease. Stroke. Blindness. Kidney disease. Depression. Poor blood flow in the feet and legs. In very bad cases, this could lead to having a leg removed by surgery (amputation). What are the causes? The exact cause of prediabetes isn't known. It may result from insulin resistance. Insulin resistance happens when cells in the body don't respond properly to insulin that the body makes. This can cause too much sugar to build up in the blood. High blood sugar, also called hyperglycemia, can develop. What increases the risk? Having a family member with type 2 diabetes. Being  older than 64 years of age. Having had a temporary form of diabetes during a pregnancy. This is called gestational diabetes. Having had polycystic ovary syndrome (PCOS). Being overweight or obese. Being inactive and not getting much exercise. Having a history of heart disease. This may include problems with cholesterol levels, high levels of blood fats, or high blood pressure. What are the signs or symptoms? You may have no symptoms. If you do have symptoms, they may include: Increased hunger. Increased thirst. Needing to pee more often. Changes in how you see, like blurry vision. Feeling tired. How is this diagnosed? Prediabetes can be diagnosed with blood tests that check your blood sugar. One or more of these tests may be done: A fasting blood glucose (FBG) test. You won't be allowed to eat (you will fast) for at least 8 hours before a blood sample is taken. An A1C blood test, also called a hemoglobin A1C test. This test shows information about blood sugar levels over the past 2?3 months. An oral glucose tolerance test (OGTT). This test measures your blood sugar at two points in time: After you haven't eaten for a while. This is your baseline level. Two hours after you drink a beverage that has sugar in it. You may be diagnosed with prediabetes if: Your FBG is 100?125 mg/dL (4.3-3.0 mmol/L). Your A1C level is 5.7?6.4% (39-46 mmol/mol). Your OGTT result is 140?199 mg/dL (2.1-88 mmol/L). These blood tests may need to be done again to be sure  of the diagnosis. How is this treated? Treatment may include making changes to your diet and lifestyle. These changes can help lower your blood sugar and keep you from getting type 2 diabetes. In some cases, medicine may be given to help lower your risk. Follow these instructions at home: Eating and drinking  Eat and drink as told. Follow a healthy meal plan. This includes eating lean proteins, whole grains, legumes, fresh fruits and vegetables,  low-fat dairy products, and healthy fats. Meet with an expert in healthy eating called a dietitian. This person can help create a healthy eating plan that's right for you. Lifestyle Do moderate-intensity exercise. Do this for at least 30 minutes a day on 5 or more days each week, or as told by your health care provider. A mix of activities may be best. Good choices include brisk walking, swimming, biking, and weight lifting. Try to lose weight if your provider says it's OK. Losing 5-7% of your body weight can help reverse insulin resistance. Do not drink alcohol if: Your provider tells you not to drink. You're pregnant, may be pregnant, or plan to become pregnant. If you drink alcohol: Limit how much you have to: 0-1 drink a day if you're male. 0-2 drinks a day if you're male. Know how much alcohol is in your drink. In the [place] one drink is one 12 oz bottle of beer (355 mL), one 5 oz glass of wine (148 mL), or one 1 oz glass of hard liquor (44 mL). General instructions Take medicines only as told. You may be given medicines that help lower the risk of type 2 diabetes. Do not smoke, vape, or use nicotine or tobacco. Where to find more information American Diabetes Association: diabetes.org/about-diabetes/prediabetes Academy of Nutrition and Dietetics: eatright.org American Heart Association: Go to ThisJobs.cz. Click the search icon. Type prediabetes in the search box. Contact a health care provider if: You have any of these symptoms: Increased hunger. Peeing more often than usual. Increased thirst. Feeling tired. Changes in how you see, like blurry vision. Feeling like you may throw up. Throwing up. Get help right away if: You have shortness of breath. You feel confused. This information is not intended to replace advice given to you by your health care provider. Make sure you discuss any questions you have with your health care provider. Document Revised: 04/17/2023 Document  Reviewed: 04/17/2023 Elsevier Patient Education  2024 [employer]  Please consider the following ways to cut down carbs and fat and increase fiber and micronutrients in your diet: - substitute whole grain for white bread or pasta - substitute brown rice for white rice - substitute 90-calorie flat bread pieces for slices of bread when possible - substitute sweet potatoes or yams for white potatoes - substitute humus for margarine - substitute tofu for cheese when possible - substitute almond or rice milk for regular milk (would not drink soy milk daily due to concern for soy estrogen influence on breast cancer risk) - substitute dark chocolate for other sweets when possible - substitute water  - can add lemon or orange slices for taste - for diet sodas (artificial sweeteners will trick your body that you can eat sweets without getting calories and will lead you to overeating and weight gain in the long run) - do not skip breakfast or other meals (this will slow down the metabolism and will result in more weight gain over time)  - can try smoothies made from fruit and almond/rice milk in am instead of regular  breakfast - can also try old-fashioned (not instant) oatmeal made with almond/rice milk in am - order the dressing on the side when eating salad at a restaurant (pour less than half of the dressing on the salad) - eat as little meat as possible - can try juicing, but should not forget that juicing will get rid of the fiber, so would alternate with eating raw veg./fruits or drinking smoothies - use as little oil as possible, even when using olive oil - can dress a salad with a mix of balsamic vinegar and lemon juice, for e.g. - use agave nectar, stevia sugar, or regular sugar rather than artificial sweateners - steam or broil/roast veggies  - snack on veggies/fruit/nuts (unsalted, preferably) when possible, rather than processed foods - reduce or eliminate aspartame in diet (it is in diet  sodas, chewing gum, etc) Read the labels!  Try to read Dr. Rosalynn Points book: Program for Reversing Diabetes for other ideas for healthy eating.

## 2024-05-29 NOTE — Progress Notes (Signed)
 Patient ID: Ryan Nicholson, male   DOB: 05-Oct-1960, 63 y.o.   MRN: [id number]  HPI: Ryan Nicholson is a 63 y.o.-year-old maler-old male, referred by his PCP, Dr. Jerrye, for management of hypothyroidism, and also reportedly prediabetes.  He is here with his wife, Mauricio Dahlen, who is also my patient.  Hypothyroidism: - Diagnosed in 01/2024 and started on levothyroxine  at that time - he is wondering whether this was a real diagnosis and he really needs to take the levothyroxine   Pt is on levothyroxine  125 mcg daily, taken: - in am - fasting - at least 30 min from b'fast - + calcium  1h later - + iron 1h later - + multivitamins 1h later - + Prevacid for ulcers after GBP 2h later - + on Biotin 1000 mcg daily-last dose today  I reviewed pt's thyroid  tests: Lab Results  Component Value Date   TSH 0.440 (L) 03/16/2022   TSH 0.516 05/18/2019   Antithyroid antibodies: No results found for: THGAB No components found for: TPOAB  Pt denies: - feeling nodules in neck - hoarseness - dysphagia - choking  She has no FH of thyroid  disorders. No FH of thyroid  cancer. No h/o radiation tx to head or neck. No recent use of iodine  supplements.  No herbal supplements.  No recent steroid use. On Biotin 1000 mcg daily.  Prediabetes: - diagnosed in 2025  Reviewed HbA1c: 2025: HbA1c 6.0% Lab Results  Component Value Date   HGBA1C 5.8 (H) 05/18/2019   Pt is not on medication for this.  He is not checking CBGs at home. He does not have a glucometer.  Pt's meals are: - Breakfast: 1/2 English muffin + PB, 1/2 glass of OJ; occas. juice - Lunch: half a sandwich - Dinner: meat + veggies + starch - Snacks: occas.  + Sweet tea and Regular sodas. He has a history of gastric bypass (R en Y) - 2005 (lost from 340 lbs >> 147 lbs today).  - no CKD, last BUN/creatinine:  05/01/2024: 18/1.07, GFR 77, glucose 88 Lab Results  Component Value Date   BUN 14 12/29/2021   BUN 15 08/03/2020   CREATININE 1.00  12/29/2021   CREATININE 1.10 08/03/2020   05/01/2024: ACR 3 05/01/2024 No results found for: MICRALBCREAT  - + HL; last set of lipids: 05/01/2024: 131/52/68/51 Lab Results  Component Value Date   CHOL 112 05/18/2019   HDL 55 05/18/2019   LDLCALC 49 05/18/2019   TRIG 38 05/18/2019   CHOLHDL 2.0 05/18/2019  On Lipitor  40 mg daily - started after CVA 2019.  - last eye exam was in 04/2024. No DR.   - + nerve damage in feet from back surgeries.  Pt has FH of DM in father and brother.  He does have a history of parkinsonism, migraines with aura, h/o CVA and TIA, hypogonadism - on testosterone cypionate, OSA, IDA, GERD, IBS, back pain (with h/o back surgeries) with subsequent peripheral neuropathy, disseminated idiopathic skeletal hyperostosis, history of kidney stones.   ROS: Constitutional: no weight gain, + weight loss, no fatigue, no subjective hyperthermia, no subjective hypothermia, no nocturia Eyes: no blurry vision, no xerophthalmia ENT: no sore throat, no nodules palpated in neck, no dysphagia, no odynophagia, no hoarseness, no tinnitus, no hypoacusis Cardiovascular: no CP, no SOB, no palpitations, no leg swelling Respiratory: no cough, no SOB, no wheezing Gastrointestinal: + N, no V, no D, + C, no acid reflux Musculoskeletal: no muscle, no joint aches Skin: no rash, no hair loss Neurological: +  tremors, + numbness in feet, no dizziness/+ HAs Psychiatric: no depression, + anxiety  Past Medical History:  Diagnosis Date   B12 deficiency    Depression    Dizziness    DJD (degenerative joint disease)    Gastric ulcer    hx of   GERD (gastroesophageal reflux disease)    History of kidney stones    Hypercholesteremia    Hypothyroidism    Migraine    Pneumonia    Post herpetic neuralgia    Stroke (HCC)    mild 2020 (weak right side), short term memory loss   Past Surgical History:  Procedure Laterality Date   BACK SURGERY  09/1999, 01/2001   CHOLECYSTECTOMY  2005    COLON SURGERY     CYSTOSCOPY W/ URETERAL STENT PLACEMENT Left 02/04/2020   Procedure: CYSTOSCOPY WITH RETROGRADE PYELOGRAM/URETERAL STENT PLACEMENT;  Surgeon: Watt Rush, MD;  Location: [facility];  Service: Urology;  Laterality: Left;   EXTRACORPOREAL SHOCK WAVE LITHOTRIPSY Left 03/03/2020   Procedure: EXTRACORPOREAL SHOCK WAVE LITHOTRIPSY (ESWL);  Surgeon: Ottelin, Mark, MD;  Location: [facility];  Service: Urology;  Laterality: Left;   EYE SURGERY Right    cataract removal   GASTRIC BYPASS  03/2004   HEMORRHOID SURGERY  2010   HERNIA REPAIR     KNEE SURGERY  1980, 1985, 2015   left shoulder surgery  2007   LUNG SURGERY Left 2002   PROSTATE SURGERY     SPINAL CORD STIMULATOR REMOVAL N/A 01/02/2020   Procedure: Spinal cord stimulator removal  Lumbar;  Surgeon: Joshua Alm RAMAN, MD;  Location: [facility];  Service: Neurosurgery;  Laterality: N/A;   SPINE SURGERY     spinal stimulator   TONSILECTOMY/ADENOIDECTOMY WITH MYRINGOTOMY     TONSILLECTOMY     TOTAL KNEE ARTHROPLASTY Right 10/18/2023   Procedure: TOTAL KNEE ARTHROPLASTY;  Surgeon: Beverley Evalene BIRCH, MD;  Location: [facility];  Service: Orthopedics;  Laterality: Right;   Social History   Socioeconomic History   Marital status: Married    Spouse name: Mary   Number of children: Not on file   Years of education: Not on file   Highest education level: Not on file  Occupational History   Not on file  Tobacco Use   Smoking status: Former    Current packs/day: 0.00    Types: Cigarettes    Quit date: 02/14/2001    Years since quitting: 23.3   Smokeless tobacco: Never  Vaping Use   Vaping status: Never Used  Substance and Sexual Activity   Alcohol use: No   Drug use: No   Sexual activity: Not on file  Other Topics Concern   Not on file  Social History Narrative   Lives with wife   Social Drivers of Health   Financial Resource Strain: Low Risk  (11/11/2022)   Received [facility]   Overall Financial Resource  Strain (CARDIA)    Difficulty of Paying Living Expenses: Not hard at all  Food Insecurity: No Food Insecurity (10/18/2023)   Hunger Vital Sign    Worried About Running Out of Food in the Last Year: Never true    Ran Out of Food in the Last Year: Never true  Transportation Needs: No Transportation Needs (10/18/2023)   PRAPARE - Administrator, Civil Service (Medical): No    Lack of Transportation (Non-Medical): No  Physical Activity: Not on file  Stress: Not on file  Social Connections: Unknown (10/26/2022)   Received from  [facility]   Social Network    Social Network: Not on file  Intimate Partner Violence: Not At Risk (10/18/2023)   Humiliation, Afraid, Rape, and Kick questionnaire    Fear of Current or Ex-Partner: No    Emotionally Abused: No    Physically Abused: No    Sexually Abused: No   Current Outpatient Medications on File Prior to Visit  Medication Sig Dispense Refill   acetaminophen  (TYLENOL ) 500 MG tablet Take 2 tablets (1,000 mg total) by mouth every 6 (six) hours as needed for mild pain (pain score 1-3) or moderate pain (pain score 4-6). 60 tablet 0   acyclovir  (ZOVIRAX ) 400 MG tablet Take 400 mg by mouth daily.     albuterol  (PROVENTIL  HFA;VENTOLIN  HFA) 108 (90 Base) MCG/ACT inhaler Inhale 2 puffs into the lungs every 4 (four) hours as needed for wheezing or shortness of breath.     atorvastatin  (LIPITOR ) 40 MG tablet Take 40 mg by mouth at bedtime.      cyanocobalamin  (,VITAMIN B-12,) 1000 MCG/ML injection Inject 1 mL (1,000 mcg total) into the muscle every 30 (thirty) days. 3 mL 1   fluticasone  (FLONASE ) 50 MCG/ACT nasal spray Place 2 sprays into both nostrils daily. 16 g 11   Galcanezumab -gnlm (EMGALITY ) 120 MG/ML SOAJ Inject 120 mg into the skin every 30 (thirty) days. 1.12 mL 11   lansoprazole (PREVACID) 30 MG capsule Take 30 mg by mouth 2 (two) times daily.     levothyroxine  (SYNTHROID ) 125 MCG tablet Take 125 mcg by mouth daily before breakfast.      methocarbamol  (ROBAXIN -750) 750 MG tablet Take 1 tablet (750 mg total) by mouth every 8 (eight) hours as needed for muscle spasms. DO NOT TAKE BACLOFEN  WHILE TAKING THIS MEDICINE! 20 tablet 0   montelukast  (SINGULAIR ) 10 MG tablet Take 10 mg by mouth daily.      ondansetron  (ZOFRAN ) 8 MG tablet Take 1 tablet (8 mg total) by mouth every 8 (eight) hours as needed for nausea or vomiting. 20 tablet 1   RESTASIS  0.05 % ophthalmic emulsion Place 1 drop into both eyes 2 (two) times daily.     sertraline  (ZOLOFT ) 100 MG tablet Take 100 mg by mouth daily.     sucralfate  (CARAFATE ) 1 GM/10ML suspension Take 1 g by mouth in the morning, at noon, and at bedtime.     tapentadol  HCl (NUCYNTA ) 75 MG tablet Take 75 mg by mouth in the morning, at noon, in the evening, and at bedtime.     testosterone cypionate (DEPOTESTOSTERONE CYPIONATE) 200 MG/ML injection Inject 200 mg into the muscle every 30 (thirty) days.     topiramate  (TOPAMAX ) 100 MG tablet Take 1 tablet (100 mg total) by mouth at bedtime. 90 tablet 3   traZODone  (DESYREL ) 100 MG tablet Take 100 mg by mouth at bedtime.      Ubrogepant  (UBRELVY ) 100 MG TABS Take 1 tablet (100 mg total) by mouth daily as needed (migaine). 16 tablet 11   No current facility-administered medications on file prior to visit.   Allergies  Allergen Reactions   Aminoglycosides    Oxycontin  [Oxycodone ] Other (See Comments)    Unknown   Reglan [Metoclopramide] Nausea Only    shakey   Family History  Problem Relation Age of Onset   Breast cancer Mother    Diabetes Father    Hypertension Father    Stroke Father    Parkinson's disease Father    Brain cancer Maternal Uncle    PE: BP  120/70   Pulse 90   Ht 5' 7 (1.702 m)   Wt 147 lb 12.8 oz (67 kg)   SpO2 96%   BMI 23.15 kg/m  Wt Readings from Last 3 Encounters:  05/29/24 147 lb 12.8 oz (67 kg)  01/02/24 151 lb (68.5 kg)  12/22/23 150 lb 12.8 oz (68.4 kg)   Constitutional: normal weight, in NAD Eyes:  EOMI, no  exophthalmos ENT: no neck masses, no cervical lymphadenopathy Cardiovascular: Tachycardia, RR, No MRG Respiratory: CTA B Musculoskeletal: no deformities Skin:no rashes Neurological: + tremor with outstretched hands  ASSESSMENT: 1.  Prediabetes  2.  Hypothyroidism  PLAN:  1. Patient with reported history of prediabetes -we reviewed the available HbA1c levels: 5.8% in 04/2019 and, per his report, he had a 6.0% HbA1c earlier this year.  We discussed that 2 values in the prediabetic range will give him a diagnosis of prediabetes.  He is not checking blood sugars at home, but does have episodes of sweating at a certain time after eating, resolved with orange juice and crackers.  He is not checking blood sugars around those times and we discussed about the need to do so.  I sent a prescription for meter and supplies to his pharmacy. -We did discuss that prediabetes is a reversible disease and indeed  HbA1c today is 5.0%, in the normal range -We reviewed his diet and he is drinking orange juice, regular sodas and sweet tea.  I strongly advised him to stop all of these.  Otherwise, he definitely does not need medications for his h/o prediabetes. - I suggested to:  Patient Instructions  Please continue levothyroxine  125 mcg daily.  Take the thyroid  hormone every day, with water , at least 30 minutes before breakfast, separated by at least 4 hours from: - acid reflux medications - calcium  - iron - multivitamins  Please stop the Biotin for 2 days and come back for labs.  Check blood sugars whenever you feel poorly.  NO SWEET DRINKS!  Please return in 3-4 month.  - discussed about CBG targets for treatment for prediabetes: 70-100 mg/dL before meals and <859 mg/dL after meals; target [id number] <5.7%. - given foot care handout and explained the principles  - given instructions for hypoglycemia management 15-15 rule  - he is up-to-date with eye exams - Return to clinic in 3-4 mo   2.   Hypothyroidism - latest thyroid  labs reviewed with pt. >> latest TSH available for review was suppressed, but the latest level is from more than 2 years ago: Lab Results  Component Value Date   TSH 0.440 (L) 03/16/2022  - he continues on LT4 125 mcg daily -apparently started approximately 3 months ago - we discussed about taking the thyroid  hormone every day, with water , >30 minutes before breakfast, separated by >4 hours from acid reflux medications, calcium , iron, multivitamins. Pt. Is not taking it correctly as he is taking calcium , iron, multivitamins, and Prevacid all approximately 1 to 2 hours after LT4.  I advised him to move these 4 hours later.  However, I first want to check his TFTs to see if we do not need to decrease the dose.  We cannot check today since he took biotin this morning.  I advised him to start this for 2 days and will have him back for a TSH and fT4 - Otherwise, I will see him back in 3 to 4 months.  Orders Placed This Encounter  Procedures      TSH   T4,  free   Lela Fendt, MD PhD [facility] Endocrinology

## 2024-06-15 ENCOUNTER — Encounter (HOSPITAL_COMMUNITY): Payer: Self-pay

## 2024-06-15 ENCOUNTER — Emergency Department (HOSPITAL_COMMUNITY)

## 2024-06-15 ENCOUNTER — Other Ambulatory Visit: Payer: Self-pay

## 2024-06-15 ENCOUNTER — Emergency Department (HOSPITAL_COMMUNITY)
Admission: EM | Admit: 2024-06-15 | Discharge: 2024-06-15 | Disposition: A | Discharge status: Home / Self Care | Attending: Emergency Medicine | Admitting: Emergency Medicine

## 2024-06-15 DIAGNOSIS — Z8673 Personal history of transient ischemic attack (TIA), and cerebral infarction without residual deficits: Secondary | ICD-10-CM | POA: Diagnosis not present

## 2024-06-15 DIAGNOSIS — Z720 Tobacco use: Secondary | ICD-10-CM | POA: Diagnosis not present

## 2024-06-15 DIAGNOSIS — R1031 Right lower quadrant pain: Secondary | ICD-10-CM | POA: Insufficient documentation

## 2024-06-15 DIAGNOSIS — R748 Abnormal levels of other serum enzymes: Secondary | ICD-10-CM | POA: Insufficient documentation

## 2024-06-15 DIAGNOSIS — R630 Anorexia: Secondary | ICD-10-CM | POA: Diagnosis not present

## 2024-06-15 DIAGNOSIS — R110 Nausea: Secondary | ICD-10-CM | POA: Diagnosis not present

## 2024-06-15 DIAGNOSIS — R001 Bradycardia, unspecified: Secondary | ICD-10-CM | POA: Diagnosis not present

## 2024-06-15 DIAGNOSIS — R109 Unspecified abdominal pain: Secondary | ICD-10-CM | POA: Diagnosis present

## 2024-06-15 LAB — COMPREHENSIVE METABOLIC PANEL WITH GFR
ALT: 24 U/L (ref 0–44)
AST: 32 U/L (ref 15–41)
Albumin: 3.8 g/dL (ref 3.5–5.0)
Alkaline Phosphatase: 62 U/L (ref 38–126)
Anion gap: 9 (ref 5–15)
BUN: 12 mg/dL (ref 8–23)
CO2: 22 mmol/L (ref 22–32)
Calcium: 9.3 mg/dL (ref 8.9–10.3)
Chloride: 107 mmol/L (ref 98–111)
Creatinine, Ser: 0.87 mg/dL (ref 0.61–1.24)
GFR, Estimated: >60 mL/min (ref >60)
Glucose, Bld: 98 mg/dL (ref 70–99)
Potassium: 5.2 mmol/L — ABNORMAL HIGH (ref 3.5–5.1)
Sodium: 138 mmol/L (ref 135–145)
Total Bilirubin: 0.7 mg/dL (ref 0.0–1.2)
Total Protein: 6.6 g/dL (ref 6.5–8.1)

## 2024-06-15 LAB — CBC
HCT: 43.8 % (ref 39.0–52.0)
Hemoglobin: 14.3 g/dL (ref 13.0–17.0)
MCH: 31.7 pg (ref 26.0–34.0)
MCHC: 32.6 g/dL (ref 30.0–36.0)
MCV: 97.1 fL (ref 80.0–100.0)
Platelets: 153 K/uL (ref 150–400)
RBC: 4.51 MIL/uL (ref 4.22–5.81)
RDW: 12.9 % (ref 11.5–15.5)
WBC: 5.1 K/uL (ref 4.0–10.5)
nRBC: 0 % (ref 0.0–0.2)

## 2024-06-15 LAB — URINALYSIS, ROUTINE W REFLEX MICROSCOPIC
Bilirubin Urine: NEGATIVE
Glucose, UA: NEGATIVE mg/dL
Hgb urine dipstick: NEGATIVE
Ketones, ur: NEGATIVE mg/dL
Leukocytes,Ua: NEGATIVE
Nitrite: NEGATIVE
Protein, ur: NEGATIVE mg/dL
Specific Gravity, Urine: 1.017 (ref 1.005–1.030)
pH: 5 (ref 5.0–8.0)

## 2024-06-15 LAB — LIPASE, BLOOD: Lipase: 80 U/L — ABNORMAL HIGH (ref 11–51)

## 2024-06-15 MED ORDER — ONDANSETRON HCL 4 MG/2ML IJ SOLN
4.0000 mg | Freq: Once | INTRAMUSCULAR | Status: AC
  Administered 2024-06-15: 4 mg via INTRAVENOUS
  Filled 2024-06-15: qty 2

## 2024-06-15 MED ORDER — SODIUM ZIRCONIUM CYCLOSILICATE 5 G PO PACK
5.0000 g | PACK | Freq: Once | ORAL | Status: AC
  Administered 2024-06-15: 5 g via ORAL
  Filled 2024-06-15: qty 1

## 2024-06-15 MED ORDER — IOHEXOL 350 MG/ML SOLN
75.0000 mL | Freq: Once | INTRAVENOUS | Status: AC | PRN
  Administered 2024-06-15: 75 mL via INTRAVENOUS

## 2024-06-15 MED ORDER — DICYCLOMINE HCL 20 MG PO TABS: 20.0000 mg | ORAL_TABLET | Freq: Two times a day (BID) | ORAL | 0 refills | Status: DC

## 2024-06-15 MED ORDER — ONDANSETRON HCL 4 MG PO TABS: 4.0000 mg | ORAL_TABLET | Freq: Three times a day (TID) | ORAL | 0 refills | Status: AC | PRN

## 2024-06-15 MED ORDER — MORPHINE SULFATE (PF) 4 MG/ML IV SOLN
4.0000 mg | Freq: Once | INTRAVENOUS | Status: AC
  Administered 2024-06-15: 4 mg via INTRAVENOUS
  Filled 2024-06-15: qty 1

## 2024-06-15 NOTE — ED Triage Notes (Signed)
 C/o right sided abd pain onset this am.

## 2024-06-15 NOTE — ED Provider Notes (Signed)
 [facility] Provider Note   CSN: [id number] Arrival date & time: 06/15/24  1451     Patient presents with: Abdominal Pain   Ryan Nicholson is a 63 y.o. male.   The history is provided by the patient, medical records and a parent. No language interpreter was used.  Abdominal Pain    63 year old male history of GERD, gastric ulcer, pneumonia, kidney stone, prior stroke presenting for complaint of abdominal pain.  Patient states since yesterday afternoon he has had pain to his abdomen.  Pains reside primarily to the right side of his abdomen, achy crampy with decrease in appetite and nausea.  Pain has been persistent nothing seems to make it better or worse.  No fever or chills no trouble urinating no blood in the urine last bowel movement was earlier today.  He still has an intact appendix.  He denies any heavy lifting.  He has history of kidney stones in the past but states he had a CT scan done at his urology office over a week ago without any evidence of any stone.  At home he is taking his Nucynta  and tramadol for pain without relief.  Prior to Admission medications   Medication Sig Start Date End Date Taking? Authorizing Provider  Accu-Chek Softclix Lancets lancets Use as instructed 1x a day 05/29/24   Trixie File, MD  acetaminophen  (TYLENOL ) 500 MG tablet Take 2 tablets (1,000 mg total) by mouth every 6 (six) hours as needed for mild pain (pain score 1-3) or moderate pain (pain score 4-6). 10/20/23   Gawne, Meghan M, PA-C  acyclovir  (ZOVIRAX ) 400 MG tablet Take 400 mg by mouth daily. 02/28/22   [provider]  albuterol  (PROVENTIL  HFA;VENTOLIN  HFA) 108 (90 Base) MCG/ACT inhaler Inhale 2 puffs into the lungs every 4 (four) hours as needed for wheezing or shortness of breath.    [provider]  atorvastatin  (LIPITOR ) 40 MG tablet Take 40 mg by mouth at bedtime.  05/18/19   [provider]  Blood Glucose Monitoring  Suppl (ACCU-CHEK GUIDE) w/Device KIT Use as advised 05/29/24   Trixie File, MD  cyanocobalamin  (,VITAMIN B-12,) 1000 MCG/ML injection Inject 1 mL (1,000 mcg total) into the muscle every 30 (thirty) days. 06/03/17   Levora Reyes SAUNDERS, MD  fluticasone  (FLONASE ) 50 MCG/ACT nasal spray Place 2 sprays into both nostrils daily. 06/03/17   Levora Reyes SAUNDERS, MD  Galcanezumab -gnlm (EMGALITY ) 120 MG/ML SOAJ Inject 120 mg into the skin every 30 (thirty) days. 01/02/24   Whitfield Raisin, NP  glucose blood test strip Use as instructed 1x a day 05/29/24   Trixie File, MD  lansoprazole (PREVACID) 30 MG capsule Take 30 mg by mouth 2 (two) times daily.    [provider]  levothyroxine  (SYNTHROID ) 125 MCG tablet Take 125 mcg by mouth daily before breakfast. 07/27/23   [provider]  methocarbamol  (ROBAXIN -750) 750 MG tablet Take 1 tablet (750 mg total) by mouth every 8 (eight) hours as needed for muscle spasms. DO NOT TAKE BACLOFEN  WHILE TAKING THIS MEDICINE! 10/20/23   Gawne, Meghan M, PA-C  montelukast  (SINGULAIR ) 10 MG tablet Take 10 mg by mouth daily.  06/07/19   [provider]  ondansetron  (ZOFRAN ) 8 MG tablet Take 1 tablet (8 mg total) by mouth every 8 (eight) hours as needed for nausea or vomiting. 06/03/17   Levora Reyes SAUNDERS, MD  RESTASIS  0.05 % ophthalmic emulsion Place 1 drop into both eyes 2 (two) times  daily.    [provider]  sertraline  (ZOLOFT ) 100 MG tablet Take 100 mg by mouth daily.    [provider]  sucralfate  (CARAFATE ) 1 GM/10ML suspension Take 1 g by mouth in the morning, at noon, and at bedtime. 01/11/22   [provider]  tapentadol  HCl (NUCYNTA ) 75 MG tablet Take 75 mg by mouth in the morning, at noon, in the evening, and at bedtime.    [provider]  testosterone cypionate (DEPOTESTOSTERONE CYPIONATE) 200 MG/ML injection Inject 200 mg into the muscle every 30 (thirty) days. 12/06/19   [provider]  topiramate   (TOPAMAX ) 100 MG tablet Take 1 tablet (100 mg total) by mouth at bedtime. 01/02/24   Whitfield Raisin, NP  traZODone  (DESYREL ) 100 MG tablet Take 100 mg by mouth at bedtime.  07/06/19   [provider]  Ubrogepant  (UBRELVY ) 100 MG TABS Take 1 tablet (100 mg total) by mouth daily as needed (migaine). 01/02/24   Whitfield Raisin, NP    Allergies: Aminoglycosides, Oxycontin  [oxycodone ], and Reglan [metoclopramide]    Review of Systems  Gastrointestinal:  Positive for abdominal pain.  All other systems reviewed and are negative.   Updated Vital Signs BP 122/82   Pulse (!) 51   Temp 97.8 F (36.6 C)   Resp 13   SpO2 100%   Physical Exam Constitutional:      General: He is not in acute distress.    Appearance: He is well-developed.     Comments: Appears uncomfortable but nontoxic  HENT:     Head: Atraumatic.  Eyes:     Conjunctiva/sclera: Conjunctivae normal.  Cardiovascular:     Rate and Rhythm: Bradycardia present.     Pulses: Normal pulses.     Heart sounds: Normal heart sounds.  Pulmonary:     Effort: Pulmonary effort is normal.     Breath sounds: Normal breath sounds. No wheezing, rhonchi or rales.  Abdominal:     Palpations: Abdomen is soft.     Tenderness: There is abdominal tenderness (Tenderness to right lower quadrant without guarding or rebound tenderness.  Bowel sounds present.). There is no right CVA tenderness or left CVA tenderness.  Musculoskeletal:     Cervical back: Normal range of motion and neck supple.  Skin:    Findings: No rash.  Neurological:     Mental Status: He is alert.     (all labs ordered are listed, but only abnormal results are displayed) Labs Reviewed  LIPASE, BLOOD - Abnormal; Notable for the following components:      Result Value   Lipase 80 (*)    All other components within normal limits  COMPREHENSIVE METABOLIC PANEL WITH GFR - Abnormal; Notable for the following components:   Potassium 5.2 (*)    All other components within  normal limits  URINALYSIS, ROUTINE W REFLEX MICROSCOPIC - Abnormal; Notable for the following components:   Color, Urine AMBER (*)    APPearance HAZY (*)    All other components within normal limits  CBC    EKG: None  Radiology: CT ABDOMEN PELVIS W CONTRAST Result Date: 06/15/2024 EXAM: CT ABDOMEN AND PELVIS WITH CONTRAST 06/15/2024 07:38:41 PM TECHNIQUE: CT of the abdomen and pelvis was performed with the administration of intravenous contrast. Multiplanar reformatted images are provided for review. Automated exposure control, iterative reconstruction, and/or weight-based adjustment of the mA/kV was utilized to reduce the radiation dose to as low as reasonably achievable. COMPARISON: 06/07/2024 CLINICAL HISTORY: Abdominal pain, acute, nonlocalized. FINDINGS: LOWER  CHEST: No acute abnormality. LIVER: The liver is unremarkable. GALLBLADDER AND BILE DUCTS: Status post cholecystectomy. Common duct measures 11 mm, likely postsurgical. Associated mild intrahepatic duct dilatation, likely postsurgical in the setting of normal LFTs. SPLEEN: No acute abnormality. PANCREAS: No acute abnormality. ADRENAL GLANDS: No acute abnormality. KIDNEYS, URETERS AND BLADDER: No stones in the kidneys or ureters. No hydronephrosis. No perinephric or periureteral stranding. Urinary bladder is unremarkable. GI AND BOWEL: No acute abnormality. 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 Normal appendix (image 62). PERITONEUM AND RETROPERITONEUM: No ascites. No free air. VASCULATURE: Aorta is normal in caliber. LYMPH NODES: No lymphadenopathy. REPRODUCTIVE ORGANS: Post surgical changes in the prostate related to prior uro left procedure. Mild heterogeneous enhancement of the right prostate (image 76), nonspecific. BONES AND SOFT TISSUES: Degenerative changes of the lower thoracic/upper lumbar spine with lateral fixation at T10-11. No acute osseous abnormality. No focal soft tissue abnormality. IMPRESSION: 1.  No acute findings in the abdomen or pelvis. 2. Mild intrahepatic and extrahepatic ductal dilatation, likely postsurgical in the setting of normal LFTs. 3. Stable postsurgical changes, as above. Electronically signed by: Pinkie Pebbles MD 06/15/2024 07:50 PM EDT RP Workstation: [id number]     Procedures   Medications Ordered in the ED  sodium zirconium cyclosilicate  (LOKELMA ) packet 5 g (has no administration in time range)  morphine  (PF) 4 MG/ML injection 4 mg (4 mg Intravenous Given 06/15/24 1852)  ondansetron  (ZOFRAN ) injection 4 mg (4 mg Intravenous Given 06/15/24 1853)  iohexol  (OMNIPAQUE ) 350 MG/ML injection 75 mL (75 mLs Intravenous Contrast Given 06/15/24 1939)                                    Medical Decision Making Amount and/or Complexity of Data Reviewed Labs: ordered. Radiology: ordered.  Risk Prescription drug management.   BP 122/82   Pulse (!) 51   Temp 97.8 F (36.6 C)   Resp 13   SpO2 100%   80:37 PM   62 year old male history of GERD, gastric ulcer, pneumonia, kidney stone, prior stroke presenting for complaint of abdominal pain.  Patient states since yesterday afternoon he has had pain to his abdomen.  Pains reside primarily to the right side of his abdomen, achy crampy with decrease in appetite and nausea.  Pain has been persistent nothing seems to make it better or worse.  No fever or chills no trouble urinating no blood in the urine last bowel movement was earlier today.  He still has an intact appendix.  He denies any heavy lifting.  He has history of kidney stones in the past but states he had a CT scan done at his urology office over a week ago without any evidence of any stone.  At home he is taking his Nucynta  and tramadol for pain without relief.  Exam notable for tenderness to right lower quadrant on palpation without guarding or rebound tenderness.  Bowel sounds are present.  No CVA tenderness.  -Labs ordered, independently viewed and interpreted by  me.  Labs remarkable for mildly elevated lipase of 80.  Potassium is 5.2, Lokelma  given, normal WBC, electrolyte panels are otherwise reassuring, urinalysis without hematuria or signs of urinary tract infection -The patient was maintained on a cardiac monitor.  I personally viewed and interpreted the cardiac monitored which showed an underlying rhythm of: Normal sinus rhythm -Imaging independently viewed and interpreted by me and I agree with radiologist's interpretation.  Result  remarkable for abdominal pelvis CT scan obtained fortunately without any acute finding.  Mild intrahepatic and extrahepatic ductal dilatation likely postsurgical in the setting of normal LFT. -This patient presents to the ED for concern of abdominal pain, this involves an extensive number of treatment options, and is a complaint that carries with it a high risk of complications and morbidity.  The differential diagnosis includes appendicitis, colitis, diverticulitis, UTI, kidney stone, GERD, gastritis, SBO, bowel perforation -Co morbidities that complicate the patient evaluation includes GERD, gastric ulcer, kidney stone -Treatment includes morphine , Zofran , Lokelma  -Reevaluation of the patient after these medicines showed that the patient improved -PCP office notes or outside notes reviewed -Escalation to admission/observation considered: patients feels much better, is comfortable with discharge, and will follow up with PCP -Prescription medication considered, patient comfortable with bentyl  -Social Determinant of Health considered which includes hx tobacco use      Final diagnoses:  Right lower quadrant abdominal pain    ED Discharge Orders          Ordered    dicyclomine  (BENTYL ) 20 MG tablet  2 times daily        06/15/24 2020    ondansetron  (ZOFRAN ) 4 MG tablet  Every 8 hours PRN        06/15/24 2020               Nivia Colon, PA-C 06/15/24 2020    Patt Alm Macho, MD 06/15/24 [phone]

## 2024-06-15 NOTE — Discharge Instructions (Signed)
 You have been evaluated for your symptoms.  Fortunately CT scan today did not show any concerning finding.  Please take medication prescribed and follow-up closely with your doctor for further care.  If you develop fever, persistent vomiting, worsening abdominal pain do not hesitate to return.

## 2024-06-15 NOTE — ED Triage Notes (Addendum)
 Pt came in via POV d/t rt sided abd pain hurting since yesterday. States this felt like when he had kidney stones in the past but he has since been told he does not have any stones. A/Ox4, rates his pain 8/10, endorses nausea, denies vomiting or diarrhea.

## 2024-06-22 ENCOUNTER — Inpatient Hospital Stay: Attending: Family Medicine

## 2024-06-26 ENCOUNTER — Inpatient Hospital Stay

## 2024-07-02 ENCOUNTER — Telehealth: Payer: Self-pay

## 2024-07-02 NOTE — Telephone Encounter (Signed)
 Called and left voicemail with patient to reschedule 10/29 appointment  If patient calls back, can be rescheduled with Dr Gregg

## 2024-07-12 ENCOUNTER — Inpatient Hospital Stay: Attending: Family Medicine

## 2024-07-12 DIAGNOSIS — E538 Deficiency of other specified B group vitamins: Secondary | ICD-10-CM | POA: Insufficient documentation

## 2024-07-12 DIAGNOSIS — D509 Iron deficiency anemia, unspecified: Secondary | ICD-10-CM | POA: Diagnosis present

## 2024-07-12 DIAGNOSIS — D508 Other iron deficiency anemias: Secondary | ICD-10-CM

## 2024-07-12 DIAGNOSIS — Z9884 Bariatric surgery status: Secondary | ICD-10-CM | POA: Insufficient documentation

## 2024-07-12 LAB — CBC WITH DIFFERENTIAL (CANCER CENTER ONLY)
Abs Immature Granulocytes: 0.03 K/uL (ref 0.00–0.07)
Basophils Absolute: 0.1 K/uL (ref 0.0–0.1)
Basophils Relative: 1 %
Eosinophils Absolute: 0.1 K/uL (ref 0.0–0.5)
Eosinophils Relative: 3 %
HCT: 42.1 % (ref 39.0–52.0)
Hemoglobin: 13.8 g/dL (ref 13.0–17.0)
Immature Granulocytes: 1 %
Lymphocytes Relative: 25 %
Lymphs Abs: 1.2 K/uL (ref 0.7–4.0)
MCH: 30.9 pg (ref 26.0–34.0)
MCHC: 32.8 g/dL (ref 30.0–36.0)
MCV: 94.2 fL (ref 80.0–100.0)
Monocytes Absolute: 0.4 K/uL (ref 0.1–1.0)
Monocytes Relative: 9 %
Neutro Abs: 3.1 K/uL (ref 1.7–7.7)
Neutrophils Relative %: 61 %
Platelet Count: 172 K/uL (ref 150–400)
RBC: 4.47 MIL/uL (ref 4.22–5.81)
RDW: 12.3 % (ref 11.5–15.5)
WBC Count: 5 K/uL (ref 4.0–10.5)
nRBC: 0 % (ref 0.0–0.2)

## 2024-07-12 LAB — VITAMIN B12: Vitamin B-12: 1232 pg/mL — ABNORMAL HIGH (ref 180–914)

## 2024-07-12 LAB — FERRITIN: Ferritin: 149 ng/mL (ref 24–336)

## 2024-07-13 NOTE — Assessment & Plan Note (Addendum)
 Will check b12 and ferritin in about 6 months. Continue B12 monthly Patient is on multivitamin

## 2024-07-13 NOTE — Assessment & Plan Note (Addendum)
 H/o gastric bypass Repeat lab in 6 months

## 2024-07-13 NOTE — Progress Notes (Unsigned)
 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   Received feraheme in Dec 2024. Ferritin normalized. Mild normocytic anemia.   No missing b12 from his wife.   Assessment & Plan History of gastric bypass Will check b12 and ferritin in about 6 months. Continue B12 monthly Patient is on multivitamin Iron deficiency anemia secondary to inadequate dietary iron intake H/o gastric bypass On oral iron 3 times a week Last IV Feraheme was in 08/2023. Repeat lab in 6 months B12 deficiency Will check b12 and ferritin in about 6 months. Continue B12 monthly Refill b12 to his pharmacy today.  Orders Placed This Encounter  Procedures   CBC with Differential (Cancer Center Only)    Standing Status:   Future    Expiration Date:   07/18/2025   Iron and Iron Binding Capacity ([facility] only)    Standing Status:   Future    Expiration Date:   07/18/2025   Ferritin    Standing Status:   Future    Expiration Date:   07/18/2025   Vitamin B12    Standing Status:   Future    Expiration Date:   07/18/2025     Ryan [name], MD  INTERVAL HISTORY: Patient returns for follow-up.  He denies any recent bleeding, such as GI bleeding.  He is a monthly injection by his wife.  PHYSICAL EXAMINATION:  Vitals:   07/18/24 0933  BP: 92/80  Pulse: 74  Resp: 17  Temp: (!) 97.5 F (36.4 C)  SpO2: 97%   Filed Weights   07/18/24 0933  Weight: 145 lb (65.8 kg)    GENERAL: alert, no distress and comfortable SKIN: skin color normal and no jaundice   Relevant data reviewed during this visit included labs.  New labs ordered.

## 2024-07-18 ENCOUNTER — Inpatient Hospital Stay

## 2024-07-18 VITALS — BP 92/80 | HR 74 | Temp 97.5°F | Resp 17 | Ht 67.0 in | Wt 145.0 lb

## 2024-07-18 DIAGNOSIS — E538 Deficiency of other specified B group vitamins: Secondary | ICD-10-CM | POA: Diagnosis not present

## 2024-07-18 DIAGNOSIS — D508 Other iron deficiency anemias: Secondary | ICD-10-CM

## 2024-07-18 DIAGNOSIS — D509 Iron deficiency anemia, unspecified: Secondary | ICD-10-CM | POA: Diagnosis not present

## 2024-07-18 DIAGNOSIS — Z9884 Bariatric surgery status: Secondary | ICD-10-CM | POA: Diagnosis not present

## 2024-07-18 MED ORDER — CYANOCOBALAMIN 1000 MCG/ML IJ SOLN: 1000.0000 ug | INTRAMUSCULAR | 1 refills | Status: AC

## 2024-07-25 ENCOUNTER — Ambulatory Visit: Admitting: Neurology

## 2024-07-30 NOTE — Telephone Encounter (Signed)
 Pt wife called to request  for Pt to be seen by Dr. Margaret since wife has been asigned to  Him as  well

## 2024-08-10 ENCOUNTER — Other Ambulatory Visit: Payer: Self-pay | Admitting: Internal Medicine

## 2024-08-10 ENCOUNTER — Encounter: Payer: Self-pay | Admitting: Internal Medicine

## 2024-08-10 MED ORDER — LEVOTHYROXINE SODIUM 112 MCG PO TABS: 112.0000 ug | ORAL_TABLET | Freq: Every day | ORAL | 3 refills | Status: AC

## 2024-09-15 ENCOUNTER — Encounter (HOSPITAL_COMMUNITY): Payer: Self-pay | Admitting: Emergency Medicine

## 2024-09-15 ENCOUNTER — Ambulatory Visit (HOSPITAL_COMMUNITY)
Admission: EM | Admit: 2024-09-15 | Discharge: 2024-09-15 | Disposition: A | Discharge status: Home / Self Care | Attending: Neurology | Admitting: Neurology

## 2024-09-15 ENCOUNTER — Ambulatory Visit (HOSPITAL_COMMUNITY)

## 2024-09-15 DIAGNOSIS — R051 Acute cough: Secondary | ICD-10-CM

## 2024-09-15 DIAGNOSIS — B349 Viral infection, unspecified: Secondary | ICD-10-CM

## 2024-09-15 LAB — POC COVID19/FLU A&B COMBO
Covid Antigen, POC: NEGATIVE
Influenza A Antigen, POC: NEGATIVE
Influenza B Antigen, POC: NEGATIVE

## 2024-09-15 MED ORDER — ACETAMINOPHEN 500 MG PO TABS: 1000.0000 mg | ORAL_TABLET | Freq: Four times a day (QID) | ORAL | 0 refills | Status: AC | PRN

## 2024-09-15 MED ORDER — DICYCLOMINE HCL 20 MG PO TABS: 20.0000 mg | ORAL_TABLET | Freq: Two times a day (BID) | ORAL | 0 refills | Status: AC

## 2024-09-15 MED ORDER — PROMETHAZINE-DM 6.25-15 MG/5ML PO SYRP: 5.0000 mL | ORAL_SOLUTION | Freq: Four times a day (QID) | ORAL | 0 refills | Status: AC | PRN

## 2024-09-15 NOTE — ED Triage Notes (Signed)
 Pt reports for several days having nausea-zofran  not helping, body aches, chills, fatigue. Tylenol  not helping, pt not able to take ibuprofen.

## 2024-09-15 NOTE — ED Provider Notes (Signed)
 " [facility]    CSN: [id number] Arrival date & time: 09/15/24  1431      History   Chief Complaint Chief Complaint  Patient presents with   Nausea   Generalized Body Aches    HPI Ryan Nicholson is a 63 y.o. male.   Ryan Nicholson is a 63 y.o. male presenting for chief complaint of Nausea and Generalized Body Aches Patient presents with nausea, vomiting, chills, body aches that started 12/18. Zofran  and tylenol  not helping with symptoms. His wife had surgery on Tuesday and he was around a big group of people, but he is not sure if any of them were sick.   Respiratory symptoms started a few days ago on 12/18. GI symptoms started on 12/17. He has had a low appetite.  Cough is non-productive, without wheezing, dyspnea or hemoptysis.  Reports associated cough described as nonproductive, diarrhea, chills, headache described as generalized, and sore throat Denies: change in voice, chest pain, heartburn, hemoptysis, shortness of breath, wheezing, and leg swelling/orthopnea.   Sick contacts with similar symptoms: none known The patient has no history of asthma.. No history of other chronic respiratory problems. Never.  has not received annual flu vaccine, has not received updated COVID-19 vaccine. has not had confirmed COVID-19 infection in the last 90 days and has not taken at home COVID test PTA.  has attempted treatment of symptoms with home remedies/over the counter therapies prior to arrival (tylenol , zofran ) without relief.   The history is provided by the patient.     Past Medical History:  Diagnosis Date   B12 deficiency    Depression    Dizziness    DJD (degenerative joint disease)    Gastric ulcer    hx of   GERD (gastroesophageal reflux disease)    History of kidney stones    Hypercholesteremia    Hypothyroidism    Migraine    Pneumonia    Post herpetic neuralgia    Stroke (HCC)    mild 2020 (weak right side), short term memory loss    Patient  Active Problem List   Diagnosis Date Noted   History of gastric bypass 12/21/2023   S/P total knee arthroplasty, right 10/18/2023   Iron deficiency anemia 08/11/2023   UTI (urinary tract infection) 02/05/2020   Left ureteral stone 02/04/2020   S/P laminectomy 01/02/2020   TIA (transient ischemic attack) 05/17/2019   Anemia 10/06/2017   Back pain 10/06/2017   Noninfectious gastroenteritis and colitis 10/06/2017   History of colonic polyps 10/06/2017   Duodenal ulcer 10/06/2017   Gall stones 10/06/2017   IBS (irritable bowel syndrome) 10/06/2017   Kidney stone 10/06/2017   Migraines 10/06/2017   Numbness and tingling of both feet 10/06/2017   Pneumonia 10/06/2017   Enlarged prostate 10/06/2017   Gastro-esophageal reflux 10/06/2017   Sleep apnea 10/06/2017   Ulcer, stomach peptic 10/06/2017    Past Surgical History:  Procedure Laterality Date   BACK SURGERY  09/1999, 01/2001   CHOLECYSTECTOMY  2005   COLON SURGERY     CYSTOSCOPY W/ URETERAL STENT PLACEMENT Left 02/04/2020   Procedure: CYSTOSCOPY WITH RETROGRADE PYELOGRAM/URETERAL STENT PLACEMENT;  Surgeon: Watt Rush, MD;  Location: [facility];  Service: Urology;  Laterality: Left;   EXTRACORPOREAL SHOCK WAVE LITHOTRIPSY Left 03/03/2020   Procedure: EXTRACORPOREAL SHOCK WAVE LITHOTRIPSY (ESWL);  Surgeon: Ottelin, Mark, MD;  Location: [facility];  Service: Urology;  Laterality: Left;   EYE SURGERY Right    cataract removal  GASTRIC BYPASS  03/2004   HEMORRHOID SURGERY  2010   HERNIA REPAIR     KNEE SURGERY  1980, 1985, 2015   left shoulder surgery  2007   LUNG SURGERY Left 2002   PROSTATE SURGERY     SPINAL CORD STIMULATOR REMOVAL N/A 01/02/2020   Procedure: Spinal cord stimulator removal  Lumbar;  Surgeon: Joshua Alm RAMAN, MD;  Location: [facility];  Service: Neurosurgery;  Laterality: N/A;   SPINE SURGERY     spinal stimulator   TONSILECTOMY/ADENOIDECTOMY WITH MYRINGOTOMY     TONSILLECTOMY     TOTAL KNEE  ARTHROPLASTY Right 10/18/2023   Procedure: TOTAL KNEE ARTHROPLASTY;  Surgeon: Beverley Evalene BIRCH, MD;  Location: [facility];  Service: Orthopedics;  Laterality: Right;       Home Medications    Prior to Admission medications  Medication Sig Start Date End Date Taking? Authorizing Provider  Accu-Chek Softclix Lancets lancets Use as instructed 1x a day 05/29/24   Trixie File, MD  acetaminophen  (TYLENOL ) 500 MG tablet Take 2 tablets (1,000 mg total) by mouth every 6 (six) hours as needed for mild pain (pain score 1-3) or moderate pain (pain score 4-6). 10/20/23   Gawne, Meghan M, PA-C  acyclovir  (ZOVIRAX ) 400 MG tablet Take 400 mg by mouth daily. 02/28/22   [provider]  albuterol  (PROVENTIL  HFA;VENTOLIN  HFA) 108 (90 Base) MCG/ACT inhaler Inhale 2 puffs into the lungs every 4 (four) hours as needed for wheezing or shortness of breath.    [provider]  atorvastatin  (LIPITOR ) 40 MG tablet Take 40 mg by mouth at bedtime.  05/18/19   [provider]  Blood Glucose Monitoring Suppl (ACCU-CHEK GUIDE) w/Device KIT Use as advised 05/29/24   Trixie File, MD  cyanocobalamin  (VITAMIN B12) 1000 MCG/ML injection Inject 1 mL (1,000 mcg total) into the muscle every 30 (thirty) days. 07/18/24   Tina Pauletta BROCKS, MD  dicyclomine  (BENTYL ) 20 MG tablet Take 1 tablet (20 mg total) by mouth 2 (two) times daily. 06/15/24   Nivia Colon, PA-C  fluticasone  (FLONASE ) 50 MCG/ACT nasal spray Place 2 sprays into both nostrils daily. 06/03/17   Levora Reyes SAUNDERS, MD  Galcanezumab -gnlm (EMGALITY ) 120 MG/ML SOAJ Inject 120 mg into the skin every 30 (thirty) days. 01/02/24   Whitfield Raisin, NP  glucose blood test strip Use as instructed 1x a day 05/29/24   Trixie File, MD  lansoprazole (PREVACID) 30 MG capsule Take 30 mg by mouth 2 (two) times daily.    [provider]  levothyroxine  (SYNTHROID ) 112 MCG tablet Take 1 tablet (112 mcg total) by mouth daily before breakfast. 08/10/24    Trixie File, MD  methocarbamol  (ROBAXIN -750) 750 MG tablet Take 1 tablet (750 mg total) by mouth every 8 (eight) hours as needed for muscle spasms. DO NOT TAKE BACLOFEN  WHILE TAKING THIS MEDICINE! 10/20/23   Gawne, Meghan M, PA-C  montelukast  (SINGULAIR ) 10 MG tablet Take 10 mg by mouth daily.  06/07/19   [provider]  ondansetron  (ZOFRAN ) 4 MG tablet Take 1 tablet (4 mg total) by mouth every 8 (eight) hours as needed. 06/15/24   Nivia Colon, PA-C  RESTASIS  0.05 % ophthalmic emulsion Place 1 drop into both eyes 2 (two) times daily.    [provider]  sertraline  (ZOLOFT ) 100 MG tablet Take 100 mg by mouth daily.    [provider]  sucralfate  (CARAFATE ) 1 GM/10ML suspension Take 1 g by mouth in the morning, at noon, and at bedtime. 01/11/22  [provider]  tapentadol  HCl (NUCYNTA ) 75 MG tablet Take 75 mg by mouth in the morning, at noon, in the evening, and at bedtime.    [provider]  testosterone cypionate (DEPOTESTOSTERONE CYPIONATE) 200 MG/ML injection Inject 200 mg into the muscle every 30 (thirty) days. 12/06/19   [provider]  topiramate  (TOPAMAX ) 100 MG tablet Take 1 tablet (100 mg total) by mouth at bedtime. 01/02/24   Whitfield Raisin, NP  traZODone  (DESYREL ) 100 MG tablet Take 100 mg by mouth at bedtime.  07/06/19   [provider]  Ubrogepant  (UBRELVY ) 100 MG TABS Take 1 tablet (100 mg total) by mouth daily as needed (migaine). 01/02/24   Whitfield Raisin, NP    Family History Family History  Problem Relation Age of Onset   Breast cancer Mother    Diabetes Father    Hypertension Father    Stroke Father    Parkinson's disease Father    Brain cancer Maternal Uncle     Social History Social History[1]   Allergies   Aminoglycosides, Oxycontin  [oxycodone ], and Reglan [metoclopramide]   Review of Systems Review of Systems   Physical Exam Triage Vital Signs ED Triage Vitals  Encounter Vitals Group     BP  09/15/24 1513 101/64     Girls Systolic BP Percentile --      Girls Diastolic BP Percentile --      Boys Systolic BP Percentile --      Boys Diastolic BP Percentile --      Pulse Rate 09/15/24 1513 65     Resp 09/15/24 1513 18     Temp 09/15/24 1513 98.4 F (36.9 C)     Temp Source 09/15/24 1513 Oral     SpO2 09/15/24 1513 97 %     Weight --      Height --      Head Circumference --      Peak Flow --      Pain Score 09/15/24 1512 6     Pain Loc --      Pain Education --      Exclude from Growth Chart --    No data found.  Updated Vital Signs BP 101/64 (BP Location: Left Arm)   Pulse 65   Temp 98.4 F (36.9 C) (Oral)   Resp 18   SpO2 97%   Visual Acuity Right Eye Distance:   Left Eye Distance:   Bilateral Distance:    Right Eye Near:   Left Eye Near:    Bilateral Near:     Physical Exam Vitals and nursing note reviewed.  Constitutional:      General: He is not in acute distress.    Appearance: He is well-developed. He is ill-appearing.  HENT:     Head: Normocephalic and atraumatic.  Eyes:     Conjunctiva/sclera: Conjunctivae normal.  Cardiovascular:     Rate and Rhythm: Normal rate and regular rhythm.     Heart sounds: No murmur heard. Pulmonary:     Effort: Pulmonary effort is normal. No respiratory distress.     Breath sounds: Normal breath sounds.  Abdominal:     Palpations: Abdomen is soft.     Tenderness: There is no abdominal tenderness.  Musculoskeletal:        General: No swelling.     Cervical back: Neck supple.  Skin:    General: Skin is warm and dry.     Capillary Refill: Capillary refill takes less than 2 seconds.  Neurological:     Mental Status: He is alert.  Psychiatric:        Mood and Affect: Mood normal.      UC Treatments / Results  Labs (all labs ordered are listed, but only abnormal results are displayed) Labs Reviewed  POC COVID19/FLU A&B COMBO    EKG   Radiology DG Chest 2 View Result Date: 09/15/2024 EXAM: 2  VIEW(S) XRAY OF THE CHEST 09/15/2024 04:13:07 PM COMPARISON: None available. CLINICAL HISTORY: cough FINDINGS: LUNGS AND PLEURA: No focal pulmonary opacity. Small left pleural effusion. No pneumothorax. HEART AND MEDIASTINUM: No acute abnormality of the cardiac and mediastinal silhouettes. BONES AND SOFT TISSUES: Surgical hardware in lower thoracic spine. IMPRESSION: 1. Small left pleural effusion. Electronically signed by: Michaeline Blanch MD 09/15/2024 04:27 PM EST RP Workstation: [id number]    Procedures Procedures (including critical care time)  Medications Ordered in UC Medications - No data to display  Initial Impression / Assessment and Plan / UC Course  I have reviewed the triage vital signs and the nursing notes.  Pertinent labs & imaging results that were available during my care of the patient were reviewed by me and considered in my medical decision making (see chart for details).  Viral Testing: Negative Chest X-ray read as small left pleural effusion however he has had a decortication surgery and this part of his normal anatomy   Chest x-ray ordered given high risk patient and symptoms described.  On exam his respiratory sounds are clear.  He does have a nonproductive cough with deep breathing.  Symptoms consistent with a viral illness.  ED precautions given.  Discussed with patient and family they are in agreement with plan of care.  Final Clinical Impressions(s) / UC Diagnoses   Final diagnoses:  Acute cough  Viral illness     Discharge Instructions      Recommend follow up with PCP for repeat chest xray in the next couple of days  You have a viral illness which will improve on its own with rest, fluids, and medications to help with your symptoms.  Tylenol , ibuprofen for musculoskeletal pain and headache.  Guaifenesin (plain mucinex), and saline nasal sprays may help relieve respiratory symptoms.  Two teaspoons of honey in 1 cup of warm water  every 4-6 hours may help  with throat pains. Humidifier in room at nighttime may help soothe cough (clean well daily).  Take Promethazine  DM cough medication to help with your cough at nighttime so that you are able to sleep. Do not drive, drink alcohol, or go to work while taking this medication since it can make you sleepy. Only take this at nighttime.  Continue taking zofran  as needed and you may temporally take 8mg  of zofran  for your nausea. Continue pushing fluids and trying to eat small meals.   For chest pain, shortness of breath, inability to keep food or fluids down without vomiting, fever that does not respond to tylenol  or motrin, or any other severe symptoms, please go to the ER for further evaluation. Return to urgent care as needed, otherwise follow-up with PCP.         ED Prescriptions   None    PDMP not reviewed this encounter.     [1]  Social History Tobacco Use   Smoking status: Former    Current packs/day: 0.00    Types: Cigarettes    Quit date: 02/14/2001    Years since quitting: 23.6   Smokeless tobacco: Never  Vaping Use   Vaping  status: Never Used  Substance Use Topics   Alcohol use: No   Drug use: No     Remi Pippin, NP 09/15/24 1646  "

## 2024-09-15 NOTE — Discharge Instructions (Addendum)
 You have a viral illness which will improve on its own with rest, fluids, and medications to help with your symptoms.  Tylenol , ibuprofen for musculoskeletal pain and headache.  Guaifenesin (plain mucinex), and saline nasal sprays may help relieve respiratory symptoms.  Two teaspoons of honey in 1 cup of warm water  every 4-6 hours may help with throat pains. Humidifier in room at nighttime may help soothe cough (clean well daily).  Take Promethazine  DM cough medication to help with your cough at nighttime so that you are able to sleep. Do not drive, drink alcohol, or go to work while taking this medication since it can make you sleepy. Only take this at nighttime.  Continue taking zofran  as needed and you may temporally take 8mg  of zofran  for your nausea. Continue pushing fluids and trying to eat small meals.   For chest pain, shortness of breath, inability to keep food or fluids down without vomiting, fever that does not respond to tylenol  or motrin, or any other severe symptoms, please go to the ER for further evaluation. Return to urgent care as needed, otherwise follow-up with PCP.

## 2024-09-18 ENCOUNTER — Other Ambulatory Visit: Payer: Self-pay

## 2024-09-18 DIAGNOSIS — G43119 Migraine with aura, intractable, without status migrainosus: Secondary | ICD-10-CM

## 2024-09-18 MED ORDER — EMGALITY 120 MG/ML ~~LOC~~ SOAJ: 120.0000 mg | SUBCUTANEOUS | 0 refills | Status: DC

## 2024-09-24 ENCOUNTER — Other Ambulatory Visit

## 2024-09-28 ENCOUNTER — Ambulatory Visit: Admitting: Internal Medicine

## 2024-10-01 ENCOUNTER — Other Ambulatory Visit

## 2024-10-02 ENCOUNTER — Encounter: Payer: Self-pay | Admitting: Diagnostic Neuroimaging

## 2024-10-02 ENCOUNTER — Ambulatory Visit: Admitting: Diagnostic Neuroimaging

## 2024-10-02 VITALS — BP 110/60 | HR 74 | Ht 67.0 in | Wt 153.8 lb

## 2024-10-02 DIAGNOSIS — G43009 Migraine without aura, not intractable, without status migrainosus: Secondary | ICD-10-CM | POA: Diagnosis not present

## 2024-10-02 DIAGNOSIS — R251 Tremor, unspecified: Secondary | ICD-10-CM | POA: Diagnosis not present

## 2024-10-02 MED ORDER — TOPIRAMATE 100 MG PO TABS: 100.0000 mg | ORAL_TABLET | Freq: Every day | ORAL | 3 refills | Status: AC

## 2024-10-02 MED ORDER — AJOVY 225 MG/1.5ML ~~LOC~~ SOAJ: 225.0000 mg | SUBCUTANEOUS | 12 refills | Status: AC

## 2024-10-02 MED ORDER — UBRELVY 100 MG PO TABS: 100.0000 mg | ORAL_TABLET | Freq: Every day | ORAL | 11 refills | Status: AC | PRN

## 2024-10-02 NOTE — Progress Notes (Signed)
 "  [facility]  PATIENT: Ryan Nicholson DOB: 10-24-1960  REFERRING CLINICIAN: Jerrye Lamar CHRISTELLA Mickey., MD  HISTORY FROM: patient  REASON FOR VISIT: follow up    HISTORICAL  CHIEF COMPLAINT:  Chief Complaint  Patient presents with   RM 7     Patient is here with wife migraines - has been having this issue for years would like to switch from emality to Ajovy . ubelvery 100mg  has been helping     HISTORY OF PRESENT ILLNESS:   UPDATE (10/03/23, VRP): Since last visit with JM in 2024, doing well. Now avg 1 HA per week. On emgality  and TPX 100mg  at bedtime. Evertt is helping. Still with postural and action tremor. No definite aura. Interested in changing emgality  to ajovy .   PRIOR HPI (8652, VRP): 64 year old male here for evaluation of headaches.  Patient has had migraine headaches since age 39 years old consisting of left-sided throbbing severe headaches associated with photophobia, nausea.  Patient was treated with Imitrex and Topamax  in the past with mild relief.  In 2018 patient developed new right-sided headache associated with jaw pain, was recommended to have neurology work-up but canceled appointment.  Headaches resolved spontaneously.  Also in 2018 patient had a stroke which was evaluated and treated in [place] .  3 weeks ago patient was at home, and children were being allowed at home, and had onset of daughter screaming patient had severe right-sided headache and pain.  This is continued continuously since that time.  He is having some dizziness and confusion associated with this.  He is still sensitive to sound.  Symptoms are different than prior migraine.  Patient has had CT of the head with and without contrast by PCP which was unremarkable.  Patient cannot have MRI apparently due to presence of spinal cord stimulator which was placed in 2014.    Patient has history of chronic back pain related to workplace injury in the year 2000.  Patient had  thoracic disc herniation at that time related to lifting heavy boxes.  Patient had surgery, spinal cord stimulator, and has been under chronic pain management since that time.   REVIEW OF SYSTEMS: Full 14 system review of systems performed and negative with exception of: As per HPI.  ALLERGIES: Allergies  Allergen Reactions   Aminoglycosides    Oxycontin  [Oxycodone ] Other (See Comments)    Unknown   Reglan [Metoclopramide] Nausea Only    shakey    HOME MEDICATIONS: Outpatient Medications Prior to Visit  Medication Sig Dispense Refill   Accu-Chek Softclix Lancets lancets Use as instructed 1x a day 100 each 3   acetaminophen  (TYLENOL ) 500 MG tablet Take 2 tablets (1,000 mg total) by mouth every 6 (six) hours as needed for mild pain (pain score 1-3) or moderate pain (pain score 4-6). 60 tablet 0   acyclovir  (ZOVIRAX ) 400 MG tablet Take 400 mg by mouth daily.     albuterol  (PROVENTIL  HFA;VENTOLIN  HFA) 108 (90 Base) MCG/ACT inhaler Inhale 2 puffs into the lungs every 4 (four) hours as needed for wheezing or shortness of breath.     atorvastatin  (LIPITOR ) 40 MG tablet Take 40 mg by mouth at bedtime.      Blood Glucose Monitoring Suppl (ACCU-CHEK GUIDE) w/Device KIT Use as advised 1 kit 0   cyanocobalamin  (VITAMIN B12) 1000 MCG/ML injection Inject 1 mL (1,000 mcg total) into the muscle every 30 (thirty) days. 6 mL 1   dicyclomine  (BENTYL ) 20 MG tablet Take 1 tablet (20 mg total)  by mouth 2 (two) times daily. 20 tablet 0   fluticasone  (FLONASE ) 50 MCG/ACT nasal spray Place 2 sprays into both nostrils daily. 16 g 11   glucose blood test strip Use as instructed 1x a day 100 each 3   lansoprazole (PREVACID) 30 MG capsule Take 30 mg by mouth 2 (two) times daily.     levothyroxine  (SYNTHROID ) 112 MCG tablet Take 1 tablet (112 mcg total) by mouth daily before breakfast. 45 tablet 3   montelukast  (SINGULAIR ) 10 MG tablet Take 10 mg by mouth daily.      ondansetron  (ZOFRAN ) 4 MG tablet Take 1 tablet  (4 mg total) by mouth every 8 (eight) hours as needed. 12 tablet 0   sertraline  (ZOLOFT ) 100 MG tablet Take 100 mg by mouth daily.     sucralfate  (CARAFATE ) 1 GM/10ML suspension Take 1 g by mouth in the morning, at noon, and at bedtime.     tapentadol  HCl (NUCYNTA ) 75 MG tablet Take 75 mg by mouth in the morning, at noon, in the evening, and at bedtime.     testosterone cypionate (DEPOTESTOSTERONE CYPIONATE) 200 MG/ML injection Inject 200 mg into the muscle every 30 (thirty) days.     traZODone  (DESYREL ) 100 MG tablet Take 100 mg by mouth at bedtime.      TYRVAYA 0.03 MG/ACT SOLN Place 1 spray into both nostrils 2 (two) times daily.     Galcanezumab -gnlm (EMGALITY ) 120 MG/ML SOAJ Inject 120 mg into the skin every 30 (thirty) days. 1.12 mL 0   topiramate  (TOPAMAX ) 100 MG tablet Take 1 tablet (100 mg total) by mouth at bedtime. 90 tablet 3   Ubrogepant  (UBRELVY ) 100 MG TABS Take 1 tablet (100 mg total) by mouth daily as needed (migaine). 16 tablet 11   methocarbamol  (ROBAXIN -750) 750 MG tablet Take 1 tablet (750 mg total) by mouth every 8 (eight) hours as needed for muscle spasms. DO NOT TAKE BACLOFEN  WHILE TAKING THIS MEDICINE! (Patient not taking: Reported on 10/02/2024) 20 tablet 0   promethazine -dextromethorphan (PROMETHAZINE -DM) 6.25-15 MG/5ML syrup Take 5 mLs by mouth 4 (four) times daily as needed for cough. (Patient not taking: Reported on 10/02/2024) 118 mL 0   RESTASIS  0.05 % ophthalmic emulsion Place 1 drop into both eyes 2 (two) times daily. (Patient not taking: Reported on 10/02/2024)     No facility-administered medications prior to visit.    PAST MEDICAL HISTORY: Past Medical History:  Diagnosis Date   B12 deficiency    Depression    Dizziness    DJD (degenerative joint disease)    Gastric ulcer    hx of   GERD (gastroesophageal reflux disease)    History of kidney stones    Hypercholesteremia    Hypothyroidism    Migraine    Pneumonia    Post herpetic neuralgia    Stroke  (HCC)    mild 2020 (weak right side), short term memory loss    PAST SURGICAL HISTORY: Past Surgical History:  Procedure Laterality Date   BACK SURGERY  09/1999, 01/2001   CHOLECYSTECTOMY  2005   COLON SURGERY     CYSTOSCOPY W/ URETERAL STENT PLACEMENT Left 02/04/2020   Procedure: CYSTOSCOPY WITH RETROGRADE PYELOGRAM/URETERAL STENT PLACEMENT;  Surgeon: Watt Rush, MD;  Location: [facility];  Service: Urology;  Laterality: Left;   EXTRACORPOREAL SHOCK WAVE LITHOTRIPSY Left 03/03/2020   Procedure: EXTRACORPOREAL SHOCK WAVE LITHOTRIPSY (ESWL);  Surgeon: Ottelin, Mark, MD;  Location: [facility];  Service: Urology;  Laterality: Left;   EYE  SURGERY Right    cataract removal   GASTRIC BYPASS  03/2004   HEMORRHOID SURGERY  2010   HERNIA REPAIR     KNEE SURGERY  1980, 1985, 2015   left shoulder surgery  2007   LUNG SURGERY Left 2002   PROSTATE SURGERY     SPINAL CORD STIMULATOR REMOVAL N/A 01/02/2020   Procedure: Spinal cord stimulator removal  Lumbar;  Surgeon: Joshua Alm RAMAN, MD;  Location: [facility];  Service: Neurosurgery;  Laterality: N/A;   SPINE SURGERY     spinal stimulator   TONSILECTOMY/ADENOIDECTOMY WITH MYRINGOTOMY     TONSILLECTOMY     TOTAL KNEE ARTHROPLASTY Right 10/18/2023   Procedure: TOTAL KNEE ARTHROPLASTY;  Surgeon: Beverley Evalene BIRCH, MD;  Location: [facility];  Service: Orthopedics;  Laterality: Right;    FAMILY HISTORY: Family History  Problem Relation Age of Onset   Breast cancer Mother    Diabetes Father    Hypertension Father    Stroke Father    Parkinson's disease Father    Sleep apnea Brother    Migraines Maternal Grandfather    Brain cancer Maternal Uncle    Seizures Neg Hx     SOCIAL HISTORY: Social History   Socioeconomic History   Marital status: Married    Spouse name: Mary   Number of children: Not on file   Years of education: Not on file   Highest education level: Not on file  Occupational History   Not on file  Tobacco Use    Smoking status: Former    Current packs/day: 0.00    Types: Cigarettes    Quit date: 02/14/2001    Years since quitting: 23.6   Smokeless tobacco: Never  Vaping Use   Vaping status: Never Used  Substance and Sexual Activity   Alcohol use: No   Drug use: No   Sexual activity: Not on file  Other Topics Concern   Not on file  Social History Narrative   Lives with wife   Decaf tea and some soda    Social Drivers of Health   Tobacco Use: Medium Risk (10/02/2024)   Patient History    Smoking Tobacco Use: Former    Smokeless Tobacco Use: Never    Passive Exposure: Not on file  Financial Resource Strain: Low Risk (11/11/2022)   Received from [facility]   Overall Financial Resource Strain (CARDIA)    Difficulty of Paying Living Expenses: Not hard at all  Food Insecurity: No Food Insecurity (10/18/2023)   Hunger Vital Sign    Worried About Running Out of Food in the Last Year: Never true    Ran Out of Food in the Last Year: Never true  Transportation Needs: No Transportation Needs (10/18/2023)   PRAPARE - Administrator, Civil Service (Medical): No    Lack of Transportation (Non-Medical): No  Physical Activity: Not on file  Stress: Not on file  Social Connections: Not on file  Intimate Partner Violence: Not At Risk (10/18/2023)   Humiliation, Afraid, Rape, and Kick questionnaire    Fear of Current or Ex-Partner: No    Emotionally Abused: No    Physically Abused: No    Sexually Abused: No  Depression (PHQ2-9): Low Risk (09/08/2023)   Depression (PHQ2-9)    PHQ-2 Score: 0  Alcohol Screen: Not on file  Housing: Low Risk (10/18/2023)   Housing Stability Vital Sign    Unable to Pay for Housing in the Last Year: No    Number of  Times Moved in the Last Year: 0    Homeless in the Last Year: No  Utilities: Not At Risk (10/18/2023)   AHC Utilities    Threatened with loss of utilities: No  Health Literacy: Not on file     PHYSICAL EXAM  GENERAL  EXAM/CONSTITUTIONAL: Vitals:  Vitals:   10/02/24 1342  BP: 110/60  Pulse: 74  SpO2: 98%  Weight: 153 lb 12.8 oz (69.8 kg)  Height: 5' 7 (1.702 m)   Body mass index is 24.09 kg/m. Wt Readings from Last 3 Encounters:  10/02/24 153 lb 12.8 oz (69.8 kg)  07/18/24 145 lb (65.8 kg)  05/29/24 147 lb 12.8 oz (67 kg)   Patient is in no distress; well developed, nourished and groomed; neck is supple  CARDIOVASCULAR: Examination of carotid arteries is normal; no carotid bruits Regular rate and rhythm, no murmurs Examination of peripheral vascular system by observation and palpation is normal  EYES: Ophthalmoscopic exam of optic discs and posterior segments is normal; no papilledema or hemorrhages No results found.  MUSCULOSKELETAL: Gait, strength, tone, movements noted in Neurologic exam below  NEUROLOGIC: MENTAL STATUS:     03/16/2022    3:00 PM  MMSE - Mini Mental State Exam  Orientation to time 5  Orientation to Place 5  Registration 3  Attention/ Calculation 3  Recall 2  Language- name 2 objects 2  Language- repeat 1  Language- follow 3 step command 3  Language- read & follow direction 1  Write a sentence 1  Copy design 1  Total score 27   awake, alert, oriented to person, place and time recent and remote memory intact normal attention and concentration language fluent, comprehension intact, naming intact fund of knowledge appropriate  CRANIAL NERVE:  2nd - no papilledema on fundoscopic exam 2nd, 3rd, 4th, 6th - pupils equal and reactive to light, visual fields full to confrontation, extraocular muscles intact, no nystagmus 5th - facial sensation symmetric 7th - facial strength symmetric 8th - hearing intact 9th - palate elevates symmetrically, uvula midline 11th - shoulder shrug symmetric 12th - tongue protrusion midline  MOTOR:  normal bulk and tone, full strength in the BUE, BLE MINIMAL POSTURAL TREMOR IN BUE MINIMAL BRADYKINESIA  SENSORY:  normal  and symmetric to light touch, temperature, vibration  COORDINATION:  finger-nose-finger, fine finger movements normal  REFLEXES:  deep tendon reflexes TRACE and symmetric  GAIT/STATION:  narrow based gait; KYPHOSIS     DIAGNOSTIC DATA (LABS, IMAGING, TESTING) - I reviewed patient records, labs, notes, testing and imaging myself where available.  Lab Results  Component Value Date   WBC 5.0 07/12/2024   HGB 13.8 07/12/2024   HCT 42.1 07/12/2024   MCV 94.2 07/12/2024   PLT 172 07/12/2024      Component Value Date/Time   NA 138 06/15/2024 1520   NA 138 06/03/2017 1151   K 5.2 (H) 06/15/2024 1520   CL 107 06/15/2024 1520   CO2 22 06/15/2024 1520   GLUCOSE 98 06/15/2024 1520   BUN 12 06/15/2024 1520   BUN 15 06/03/2017 1151   CREATININE 0.87 06/15/2024 1520   CALCIUM  9.3 06/15/2024 1520   PROT 6.6 06/15/2024 1520   PROT 6.6 06/03/2017 1151   ALBUMIN 3.8 06/15/2024 1520   ALBUMIN 4.3 06/03/2017 1151   AST 32 06/15/2024 1520   ALT 24 06/15/2024 1520   ALKPHOS 62 06/15/2024 1520   BILITOT 0.7 06/15/2024 1520   BILITOT 0.4 06/03/2017 1151   GFRNONAA >60 06/15/2024 1520  GFRAA >60 02/05/2020 0350   Lab Results  Component Value Date   CHOL 112 05/18/2019   HDL 55 05/18/2019   LDLCALC 49 05/18/2019   TRIG 38 05/18/2019   CHOLHDL 2.0 05/18/2019   Lab Results  Component Value Date   HGBA1C 5.8 (H) 05/18/2019   Lab Results  Component Value Date   VITAMINB12 1,232 (H) 07/12/2024   Lab Results  Component Value Date   TSH 0.440 (L) 03/16/2022    04/17/17 CT maxillofacial [I reviewed images myself and agree with interpretation. -VRP]  - Advanced degenerative change in the TMJ bilaterally. No fracture or dislocation.  10/28/22 MRI of the brain with and without contrast shows the following: In the cerebral hemispheres, there are a few small T2/FLAIR hyperintense foci in the subcortical white matter.  None of these appear to be acute.  They do not enhance.  Compared  to the MRI from 12/29/2021, there were no new lesions.  These are most consistent with minimal chronic microvascular ischemic changes, stable compared to the previous MRI.  The extent is typical for age. No acute findings.  Normal enhancement pattern.  01/26/24 DATscan  - Ioflupane scan within normal limits. No reduced radiotracer activity in basal ganglia to suggest Parkinson's syndrome pathology.    ASSESSMENT AND PLAN  64 y.o. year old male here with history of migraine HA.  Dx:   1. Migraine without aura and without status migrainosus, not intractable   2. Excessive physiologic tremor    PLAN:  MIGRAINE WITH AURA (1 HA per week) - change emgality  to ajovy  monthly injection - continue topiramate  100mg  at bedtime (may increase to twice a day in future) - continue ubrelvy  100mg  daily as needed  ENHANCED PHYSIOLOGIC TREMOR (vs essential tremor) - monitor for now; consider primidone, gabapentin, propranolol in future  Meds ordered this encounter  Medications   Fremanezumab -vfrm (AJOVY ) 225 MG/1.5ML SOAJ    Sig: Inject 225 mg into the skin every 30 (thirty) days.    Dispense:  1.68 mL    Refill:  12   topiramate  (TOPAMAX ) 100 MG tablet    Sig: Take 1 tablet (100 mg total) by mouth at bedtime.    Dispense:  90 tablet    Refill:  3   Ubrogepant  (UBRELVY ) 100 MG TABS    Sig: Take 1 tablet (100 mg total) by mouth daily as needed (migaine).    Dispense:  16 tablet    Refill:  11   Return in about 1 year (around 10/02/2025) for with NP Young Bogaert).    EDUARD FABIENE HANLON, MD 10/02/2024, 2:23 PM Certified in Neurology, Neurophysiology and Neuroimaging  [facility] [address], [address] [place], [place] [address] [phone]  "

## 2024-10-02 NOTE — Patient Instructions (Signed)
" °  MIGRAINE WITH AURA (1 HA per week) - change emgality  to ajovy  monthly injection - continue topiramate  100mg  at bedtime (may increase to twice a day in future) - continue ubrelvy  100mg  daily as needed  ENHANCED PHYSIOLOGIC TREMOR (vs essential tremor) - monitor for now; consider primidone, gabapentin, propranolol in future "

## 2024-10-04 ENCOUNTER — Ambulatory Visit: Admitting: Internal Medicine

## 2024-10-10 ENCOUNTER — Telehealth: Payer: Self-pay | Admitting: Diagnostic Neuroimaging

## 2024-10-10 NOTE — Telephone Encounter (Signed)
 Pt's wife called stating that their ins requires a PA for AJOVY .

## 2024-10-12 ENCOUNTER — Telehealth: Payer: Self-pay

## 2024-10-12 ENCOUNTER — Other Ambulatory Visit (HOSPITAL_COMMUNITY): Payer: Self-pay

## 2024-10-12 NOTE — Telephone Encounter (Signed)
 PA request has been Started. New Encounter has been or will be created for follow up. For additional info see Pharmacy Prior Auth telephone encounter from 10-12-2024.

## 2024-10-12 NOTE — Telephone Encounter (Signed)
 Pharmacy Patient Advocate Encounter   Received notification from Pt Calls Messages that prior authorization for AJOVY  (fremanezumab -vfrm) injection 225MG /1.5ML auto-injectors  is required/requested.   Insurance verification completed.   The patient is insured through [facility].   Per test claim:  Aimovig and Qulipta is preferred by the insurance.  If suggested medication is appropriate, Please send in a new RX and discontinue this one. If not, please advise as to why it's not appropriate so that we may request a Prior Authorization. Please note, some preferred medications may still require a PA.  If the suggested medications have not been trialed and there are no contraindications to their use, the PA will not be submitted, as it will not be approved. Archived Key: [id number]

## 2024-10-15 NOTE — Telephone Encounter (Signed)
 Patient was first prescribed Emgaility on 03/16/22 by Dr. Rush. I called the patient and he stated his last Emgailty injection was the first of this January and he is currently out of refills.

## 2024-10-15 NOTE — Telephone Encounter (Signed)
 Pt's wife has called to f/u on the needed PA for Ajovy , please call wife with response from PA team on 10-12-24

## 2024-10-17 ENCOUNTER — Other Ambulatory Visit (HOSPITAL_COMMUNITY): Payer: Self-pay

## 2024-10-17 ENCOUNTER — Telehealth: Payer: Self-pay

## 2024-10-17 MED ORDER — AIMOVIG 70 MG/ML ~~LOC~~ SOAJ: 70.0000 mg | SUBCUTANEOUS | 4 refills | Status: AC

## 2024-10-17 NOTE — Telephone Encounter (Signed)
 Patient wants to try aimovig  so that he can get the ajovy  approved in the future.

## 2024-10-17 NOTE — Telephone Encounter (Signed)
 Meds ordered this encounter  Medications   Erenumab -aooe (AIMOVIG ) 70 MG/ML SOAJ    Sig: Inject 70 mg into the skin every 30 (thirty) days.    Dispense:  3 mL    Refill:  4    EDUARD FABIENE HANLON, MD 10/17/2024, 10:31 AM Certified in Neurology, Neurophysiology and Neuroimaging  [facility] [address] [place], [place] [address] [phone]

## 2024-10-17 NOTE — Telephone Encounter (Signed)
 Pharmacy Patient Advocate Encounter   Received notification from Pt Calls Messages that prior authorization for Aimovig  70MG /ML auto-injectors is required/requested.   Insurance verification completed.   The patient is insured through [facility].   Prior Authorization for Aimovig  70MG /ML auto-injectors has been APPROVED from 10-17-2024 to 09-26-2025. Ran test claim, Copay is $12.65 for a 90 day supply. This test claim was processed through [facility]- copay amounts may vary at other pharmacies due to pharmacy/plan contracts, or as the patient moves through the different stages of their insurance plan.   PA #/Case ID/Reference #: [id number]

## 2025-01-10 ENCOUNTER — Inpatient Hospital Stay

## 2025-01-15 ENCOUNTER — Inpatient Hospital Stay

## 2025-10-01 ENCOUNTER — Ambulatory Visit: Admitting: Adult Health
